# Patient Record
Sex: Female | Born: 1975
Health system: Southern US, Community
[De-identification: ages and names within clinical notes are randomized; demographics above are authoritative.]

## PROBLEM LIST (undated history)

## (undated) ENCOUNTER — Inpatient Hospital Stay (HOSPITAL_COMMUNITY): Payer: Self-pay

## (undated) DIAGNOSIS — R51 Headache: Secondary | ICD-10-CM

## (undated) DIAGNOSIS — Q858 Other phakomatoses, not elsewhere classified: Secondary | ICD-10-CM

## (undated) DIAGNOSIS — G43829 Menstrual migraine, not intractable, without status migrainosus: Secondary | ICD-10-CM

## (undated) DIAGNOSIS — B999 Unspecified infectious disease: Secondary | ICD-10-CM

## (undated) DIAGNOSIS — R569 Unspecified convulsions: Secondary | ICD-10-CM

## (undated) DIAGNOSIS — D649 Anemia, unspecified: Secondary | ICD-10-CM

## (undated) DIAGNOSIS — Q8589 Glaucoma in diseases classified elsewhere: Secondary | ICD-10-CM

## (undated) DIAGNOSIS — H42 Glaucoma in diseases classified elsewhere: Secondary | ICD-10-CM

## (undated) HISTORY — DX: Glaucoma in diseases classified elsewhere: H42

## (undated) HISTORY — DX: Menstrual migraine, not intractable, without status migrainosus: G43.829

## (undated) HISTORY — PX: TOE SURGERY: SHX1073

## (undated) HISTORY — PX: EYE SURGERY: SHX253

## (undated) HISTORY — DX: Other phakomatoses, not elsewhere classified: Q85.8

## (undated) HISTORY — DX: Glaucoma in diseases classified elsewhere: Q85.89

---
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
## 2008-11-30 HISTORY — PX: EYE SURGERY: SHX253

## 2013-10-01 ENCOUNTER — Other Ambulatory Visit: Payer: Self-pay | Admitting: Obstetrics and Gynecology

## 2013-10-01 LAB — HCG, QUANTITATIVE, PREGNANCY: Beta Hcg, Quant.: 31 m[IU]/mL — ABNORMAL HIGH

## 2014-01-02 ENCOUNTER — Ambulatory Visit (INDEPENDENT_AMBULATORY_CARE_PROVIDER_SITE_OTHER): Payer: BC Managed Care – PPO | Admitting: *Deleted

## 2014-01-02 ENCOUNTER — Encounter: Payer: Self-pay | Admitting: *Deleted

## 2014-01-02 DIAGNOSIS — N926 Irregular menstruation, unspecified: Secondary | ICD-10-CM

## 2014-01-02 DIAGNOSIS — Z3201 Encounter for pregnancy test, result positive: Secondary | ICD-10-CM

## 2014-01-02 LAB — POCT PREGNANCY, URINE: Preg Test, Ur: POSITIVE — AB

## 2014-01-02 LAB — OB RESULTS CONSOLE HGB/HCT, BLOOD
HCT: 40 %
Hemoglobin: 13.5 g/dL

## 2014-01-02 LAB — OB RESULTS CONSOLE PLATELET COUNT: Platelets: 209 10*3/uL

## 2014-01-02 LAB — HIV ANTIBODY (ROUTINE TESTING W REFLEX): HIV: NONREACTIVE

## 2014-01-02 NOTE — Progress Notes (Signed)
POSITIVE: pregnancy test, labs drawn, letter of verification.

## 2014-01-03 ENCOUNTER — Encounter: Payer: Self-pay | Admitting: Obstetrics and Gynecology

## 2014-01-03 DIAGNOSIS — O26899 Other specified pregnancy related conditions, unspecified trimester: Secondary | ICD-10-CM | POA: Insufficient documentation

## 2014-01-03 DIAGNOSIS — Z6791 Unspecified blood type, Rh negative: Secondary | ICD-10-CM | POA: Insufficient documentation

## 2014-01-03 LAB — OBSTETRIC PANEL
Antibody Screen: NEGATIVE
Basophils Absolute: 0 10*3/uL (ref 0.0–0.1)
Basophils Relative: 0 % (ref 0–1)
Eosinophils Absolute: 0.1 10*3/uL (ref 0.0–0.7)
Eosinophils Relative: 2 % (ref 0–5)
HCT: 39.6 % (ref 36.0–46.0)
Hemoglobin: 13.5 g/dL (ref 12.0–15.0)
Hepatitis B Surface Ag: NEGATIVE
Lymphocytes Relative: 25 % (ref 12–46)
Lymphs Abs: 1.4 10*3/uL (ref 0.7–4.0)
MCH: 30.3 pg (ref 26.0–34.0)
MCHC: 34.1 g/dL (ref 30.0–36.0)
MCV: 89 fL (ref 78.0–100.0)
Monocytes Absolute: 0.6 10*3/uL (ref 0.1–1.0)
Monocytes Relative: 10 % (ref 3–12)
Neutro Abs: 3.4 10*3/uL (ref 1.7–7.7)
Neutrophils Relative %: 63 % (ref 43–77)
Platelets: 209 10*3/uL (ref 150–400)
RBC: 4.45 MIL/uL (ref 3.87–5.11)
RDW: 13.8 % (ref 11.5–15.5)
Rh Type: NEGATIVE
Rubella: 1.4 Index — ABNORMAL HIGH (ref <0.90)
WBC: 5.5 10*3/uL (ref 4.0–10.5)

## 2014-01-04 LAB — CYSTIC FIBROSIS DIAGNOSTIC STUDY

## 2014-02-07 ENCOUNTER — Encounter: Payer: Self-pay | Admitting: Advanced Practice Midwife

## 2014-02-07 ENCOUNTER — Ambulatory Visit (INDEPENDENT_AMBULATORY_CARE_PROVIDER_SITE_OTHER): Payer: BC Managed Care – PPO | Admitting: Advanced Practice Midwife

## 2014-02-07 VITALS — BP 123/72 | Temp 97.2°F | Ht 62.0 in | Wt 240.3 lb

## 2014-02-07 DIAGNOSIS — Z3490 Encounter for supervision of normal pregnancy, unspecified, unspecified trimester: Secondary | ICD-10-CM

## 2014-02-07 DIAGNOSIS — Q858 Other phakomatoses, not elsewhere classified: Secondary | ICD-10-CM | POA: Insufficient documentation

## 2014-02-07 DIAGNOSIS — Q859 Phakomatosis, unspecified: Secondary | ICD-10-CM

## 2014-02-07 DIAGNOSIS — Q8589 Other phakomatoses, not elsewhere classified: Secondary | ICD-10-CM | POA: Insufficient documentation

## 2014-02-07 DIAGNOSIS — IMO0002 Reserved for concepts with insufficient information to code with codable children: Secondary | ICD-10-CM

## 2014-02-07 DIAGNOSIS — O219 Vomiting of pregnancy, unspecified: Secondary | ICD-10-CM

## 2014-02-07 DIAGNOSIS — Z3480 Encounter for supervision of other normal pregnancy, unspecified trimester: Secondary | ICD-10-CM

## 2014-02-07 DIAGNOSIS — O3680X0 Pregnancy with inconclusive fetal viability, not applicable or unspecified: Secondary | ICD-10-CM

## 2014-02-07 DIAGNOSIS — O09529 Supervision of elderly multigravida, unspecified trimester: Secondary | ICD-10-CM

## 2014-02-07 DIAGNOSIS — O210 Mild hyperemesis gravidarum: Secondary | ICD-10-CM

## 2014-02-07 LAB — POCT URINALYSIS DIP (DEVICE)
Bilirubin Urine: NEGATIVE
Glucose, UA: NEGATIVE mg/dL
Hgb urine dipstick: NEGATIVE
Ketones, ur: NEGATIVE mg/dL
Leukocytes, UA: NEGATIVE
Nitrite: NEGATIVE
Protein, ur: NEGATIVE mg/dL
Specific Gravity, Urine: 1.02 (ref 1.005–1.030)
Urobilinogen, UA: 0.2 mg/dL (ref 0.0–1.0)
pH: 7 (ref 5.0–8.0)

## 2014-02-07 LAB — US OB COMP LESS 14 WKS

## 2014-02-07 MED ORDER — DOXYLAMINE-PYRIDOXINE 10-10 MG PO TBEC: DELAYED_RELEASE_TABLET | ORAL | Status: DC

## 2014-02-07 NOTE — Progress Notes (Signed)
Subjective:    Misty Little is a G2P0010 [redacted]w[redacted]d being seen today for her first obstetrical visit.  Her obstetrical history is significant for SAB x1. Patient does intend to breast feed. Pregnancy history fully reviewed. 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  She is concerned about genetic risks for the baby related to her diagnosis. She also reports daily nausea without vomiting.  She has Diclegis samples but has not taken any and wants to discuss safety of medication.   Patient reports nausea.  Filed Vitals:   02/07/14 0941 02/07/14 0945  BP: 123/72   Temp: 97.2 F (36.2 C)   Height:  5\' 2"  (1.575 m)  Weight: 108.999 kg (240 lb 4.8 oz)     HISTORY: OB History  Gravida Para Term Preterm AB SAB TAB Ectopic Multiple Living  2 0 0 0 1 1 0 0 0 0     # Outcome Date GA Lbr Len/2nd Weight Sex Delivery Anes PTL Lv  2 CUR           1 SAB 08/2013 [redacted]w[redacted]d            Comments: no complications     Past Medical History  Diagnosis Date  . Sturge-Weber syndrome with glaucoma    Past Surgical History  Procedure Laterality Date  . Eye surgery  1977  . Toe surgery  1977   Family History  Problem Relation Age of Onset  . Heart disease Mother      Exam    Uterus:   ~10 week size  Pelvic Exam:    Perineum: No Hemorrhoids, Normal Perineum   Vulva: normal   Vagina:  normal mucosa, normal discharge   pH:    Cervix: no bleeding following Pap, no cervical motion tenderness, no lesions and nulliparous appearance   Adnexa: normal adnexa and no mass, fullness, tenderness   Bony Pelvis: average  System: Breast:  normal appearance, no masses or tenderness   Skin:  large hemangioma/birth mark covering right side of pt face with facial distortion/drooping of nose/mouth on right    Neurologic: oriented, normal, gait normal; reflexes normal and symmetric   Extremities:  normal strength, tone, and muscle mass, ROM of all joints is normal   HEENT neck supple with midline trachea and thyroid without masses   Mouth/Teeth mucous membranes moist, pharynx normal without lesions   Neck supple and no masses   Cardiovascular: regular rate and rhythm   Respiratory:  appears well, vitals normal, no respiratory distress, acyanotic, normal RR, ear and throat exam is normal, neck free of mass or lymphadenopathy, chest clear, no wheezing, crepitations, rhonchi, normal symmetric air entry   Abdomen: soft, non-tender; bowel sounds normal; no masses,  no organomegaly   Urinary: urethral meatus normal      Assessment:    Pregnancy: G2P0010 Patient Active Problem List   Diagnosis Date Noted  . Advanced maternal age, antepartum 02/07/2014  . 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 Rh negative state in antepartum period 01/03/2014        Plan:     Initial labs drawn. Prenatal vitamins. Problem list reviewed and updated. Genetic Screening discussed First Screen and Panorama: Panorama (Cell-free DNA) test requested.  MFM consult scheduled. MFM recommended NT also--scheduled.   Ultrasound discussed; fetal survey: requested.  Follow up in 4 weeks. 50% of 30 min visit spent on counseling and coordination of  care.     LEFTWICH-KIRBY, LISA 02/07/2014

## 2014-02-07 NOTE — Progress Notes (Signed)
Bedside US for FHR = 148 bpm per PW doppler.  FM observed.  Sharen CounterLisa Leftwich-Kirby CNM notified.

## 2014-02-07 NOTE — Progress Notes (Signed)
P= 74, Here for initial visit. Given new patient information. Discussed bmi/ appropriate weight gain.

## 2014-02-08 LAB — PRESCRIPTION MONITORING PROFILE (19 PANEL)
Amphetamine/Meth: NEGATIVE ng/mL
Barbiturate Screen, Urine: NEGATIVE ng/mL
Benzodiazepine Screen, Urine: NEGATIVE ng/mL
Buprenorphine, Urine: NEGATIVE ng/mL
Cannabinoid Scrn, Ur: NEGATIVE ng/mL
Carisoprodol, Urine: NEGATIVE ng/mL
Cocaine Metabolites: NEGATIVE ng/mL
Creatinine, Urine: 70.59 mg/dL (ref >20.0)
Fentanyl, Ur: NEGATIVE ng/mL
MDMA URINE: NEGATIVE ng/mL
Meperidine, Ur: NEGATIVE ng/mL
Methadone Screen, Urine: NEGATIVE ng/mL
Methaqualone: NEGATIVE ng/mL
Nitrites, Initial: NEGATIVE ug/mL
Opiate Screen, Urine: NEGATIVE ng/mL
Oxycodone Screen, Ur: NEGATIVE ng/mL
Phencyclidine, Ur: NEGATIVE ng/mL
Propoxyphene: NEGATIVE ng/mL
Tapentadol, urine: NEGATIVE ng/mL
Tramadol Scrn, Ur: NEGATIVE ng/mL
Zolpidem, Urine: NEGATIVE ng/mL
pH, Initial: 7.4 pH (ref 4.5–8.9)

## 2014-02-08 LAB — GLUCOSE TOLERANCE, 1 HOUR (50G) W/O FASTING: GLUCOSE 1 HOUR GTT: 83 mg/dL (ref 70–140)

## 2014-02-11 LAB — CULTURE, OB URINE: Colony Count: >=100000

## 2014-02-15 ENCOUNTER — Other Ambulatory Visit: Payer: Self-pay | Admitting: Advanced Practice Midwife

## 2014-02-15 DIAGNOSIS — Z3682 Encounter for antenatal screening for nuchal translucency: Secondary | ICD-10-CM

## 2014-02-20 ENCOUNTER — Ambulatory Visit (HOSPITAL_COMMUNITY)
Admission: RE | Admit: 2014-02-20 | Discharge: 2014-02-20 | Disposition: A | Discharge status: Home / Self Care | Payer: BC Managed Care – PPO | Source: Ambulatory Visit | Attending: Family Medicine | Admitting: Family Medicine

## 2014-02-20 ENCOUNTER — Ambulatory Visit (HOSPITAL_COMMUNITY): Payer: BC Managed Care – PPO

## 2014-02-20 DIAGNOSIS — O3510X0 Maternal care for (suspected) chromosomal abnormality in fetus, unspecified, not applicable or unspecified: Secondary | ICD-10-CM | POA: Insufficient documentation

## 2014-02-20 DIAGNOSIS — Z3682 Encounter for antenatal screening for nuchal translucency: Secondary | ICD-10-CM

## 2014-02-20 DIAGNOSIS — Z3689 Encounter for other specified antenatal screening: Secondary | ICD-10-CM | POA: Insufficient documentation

## 2014-02-20 DIAGNOSIS — O351XX0 Maternal care for (suspected) chromosomal abnormality in fetus, not applicable or unspecified: Secondary | ICD-10-CM | POA: Insufficient documentation

## 2014-02-20 DIAGNOSIS — O09529 Supervision of elderly multigravida, unspecified trimester: Secondary | ICD-10-CM | POA: Insufficient documentation

## 2014-02-20 DIAGNOSIS — O352XX0 Maternal care for (suspected) hereditary disease in fetus, not applicable or unspecified: Secondary | ICD-10-CM | POA: Insufficient documentation

## 2014-02-20 NOTE — ED Notes (Signed)
Appointment Date: 02/20/2014 DOB: Jan 02, 1976 Referring Provider: Vale Haven, MD Attending: Dr. Particia Nearing  [name]  was seen for genetic counseling because of a maternal age of 38 y.o..     She was counseled regarding maternal age and the association with risk for chromosome conditions due to nondisjunction with aging of the ova.   We reviewed chromosomes, nondisjunction, and the associated 1 in 101 risk for fetal aneuploidy related to a maternal age of 38 y.o. at [redacted]w[redacted]d gestation.  She was counseled that the risk for aneuploidy decreases as gestational age increases, accounting for those pregnancies which spontaneously abort.  We specifically discussed Down syndrome (trisomy 36), trisomies 8 and 32, and sex chromosome aneuploidies (47,XXX and 47,XXY) including the common features and prognoses of each.   We reviewed available screening options including First Screen, noninvasive prenatal screening (NIPS)/cell free fetal DNA (cffDNA) testing, and detailed ultrasound.  She was counseled that screening tests are used to modify a patient's a priori risk for aneuploidy, typically based on age. This estimate provides a pregnancy specific risk assessment. We reviewed the benefits and limitations of each option. Specifically, we discussed the conditions for which each test screens, the detection rates, and false positive rates of each. She was also counseled regarding diagnostic testing via [facility] and amniocentesis. We reviewed the approximate 1 in 300-500 risk for complications from amniocentesis, including spontaneous pregnancy loss. After consideration of all the options, she elected to proceed with NIPS.  Those results will be available in 8-10 days. Misty Little was counseld regarding the difference in cost between first screen and NIPS, she understands that approximately $3000 is billed to her insurance for NIPS and that the exact amount that she would be required to pay would be based upon the specifics  of her insurance plan and be subject to her co-pay and deductible.    She also expressed interest in pursuing a nuchal translucency ultrasound, which was performed today.  The report will be documented separately.  She understands that screening tests cannot rule out all birth defects or genetic syndromes. The patient was advised of this limitation and states she still does not want diagnostic testing at this time.   Misty Little was provided with written information regarding cystic fibrosis (CF) including the carrier frequency and incidence in the Caucasian population, the availability of carrier testing and prenatal diagnosis if indicated.  In addition, we discussed that CF is routinely screened for as part of the [place] newborn screening panel.  She declined testing today.   Both family histories were reviewed.  Misty Little reported that she has Sturge Weber syndrome.  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  We discussed that Sturge Weber syndrome (SWS) is caused by somatic mosaicism in the GNAQ gene on chromsome 9.  The most common features are facial cutaneous vascular malformations (port-wine stains), seizures and glaucoma.  The clinical course can be variable with some children having intractable seizures, mental retardation and recurrent stroke-like episodes.  No clear evidence of heredity has been discovered with SWS.  She was counseled that somatic mosaicism means that there is a change in the GNAQ gene in some of the cells, but not in others.  In addition, a somatic change means that the mutation was not present in the initial egg or sperm cell which formed the embryo from which she developed.  Thus, it may be that some of her egg  cells contain this mutation, or it may be that none of them contain the mutation.  If none of Misty Little's egg cells contain the mutation, then we would not expect her to have a child with SWS.  If some of her egg cells contain  the mutation, then there would be an increased chance to have a child with SWS, but the specific chance would depend upon the proportion of egg cells with the mutation.  Misty Little understands that at this time, there is no prenatal testing for SWS.  Misty Little was comfortable with the overall low chance, and stated that it would not bother her if her child had SWS.  The remainder of the family history was found to be noncontributory for birth defects, intellectual disability, and known genetic conditions. Without further information regarding the provided family history, an accurate genetic risk cannot be calculated. Further genetic counseling is warranted if more information is obtained.  Misty Little denied exposure to environmental toxins or chemical agents. She denied the use of alcohol, tobacco or street drugs. She denied significant viral illnesses during the course of her pregnancy. Her medical and surgical histories were noncontributory, other than as described above.   I counseled Misty Little regarding the above risks and available options.  The approximate face-to-face time with the genetic counselor was 50 minutes. Most of the counseling was provided by Patrina Leveringaylor Zuck, [employer] genetic counseling student, under my direct supervision.   Misty Gemmaaragh Conrad, MS  Certified Genetic Counselor

## 2014-02-21 ENCOUNTER — Other Ambulatory Visit (HOSPITAL_COMMUNITY): Payer: BC Managed Care – PPO

## 2014-02-21 ENCOUNTER — Encounter: Payer: Self-pay | Admitting: Advanced Practice Midwife

## 2014-02-21 ENCOUNTER — Other Ambulatory Visit: Payer: Self-pay | Admitting: Advanced Practice Midwife

## 2014-02-21 MED ORDER — NITROFURANTOIN MONOHYD MACRO 100 MG PO CAPS: 100.0000 mg | ORAL_CAPSULE | Freq: Two times a day (BID) | ORAL | Status: DC

## 2014-02-22 ENCOUNTER — Telehealth: Payer: Self-pay

## 2014-02-22 NOTE — Telephone Encounter (Signed)
Message copied by Louanna RawAMPBELL, Dalonte Hardage M on Thu Feb 22, 2014  9:24 AM ------      Message from: LEFTWICH-KIRBY, LISA A      Created: Wed Feb 21, 2014  2:21 PM       Please call pt to inform her of UTI in urine at initial prenatal visit on 02/07/14.  Macrobid 100 mg BID x7 days sent to pharmacy.  Thank you. ------

## 2014-02-22 NOTE — Telephone Encounter (Signed)
Called pt. And informed her of UTI and of prescription waiting for her at her pharmacy. Informed her she will take the Macrobid twice a day for 7 days. Pt. Verbalized understanding and had no further questions or concerns.

## 2014-03-02 ENCOUNTER — Telehealth (HOSPITAL_COMMUNITY): Payer: Self-pay

## 2014-03-02 NOTE — Telephone Encounter (Signed)
Called Misty Little to discuss her cell free fetal DNA test results.  [name] had Panorama testing through [facility].  Testing was offered because of a maternal age of 38.   The patient was identified by name and DOB.  We reviewed that these are within normal limits, showing a less than 1 in 10,000 risk for trisomies 21, 18 and 13, and monosomy X (Turner syndrome).  In addition, the risk for triploidy/vanishing twin and sex chromosome trisomies (47,XXX and 47,XXY) was also low risk.  We reviewed that this testing identifies > 99% of pregnancies with trisomy 5921, trisomy 8513, sex chromosome trisomies (47,XXX and 47,XXY), and triploidy. The detection rate for trisomy 18 is 96%.  The detection rate for monosomy X is ~92%.  The false positive rate is <0.1% for all conditions. Testing was also consistent with female gender.  The patient did wish to know gender.  She understands that this testing does not identify all genetic conditions.  All questions were answered to her satisfaction, she was encouraged to call with additional questions or concerns.  Despina AriasSTANLEY, Misty Schaffert, MS Certified Genetic Counselor

## 2014-03-07 ENCOUNTER — Ambulatory Visit (INDEPENDENT_AMBULATORY_CARE_PROVIDER_SITE_OTHER): Payer: BC Managed Care – PPO | Admitting: Family Medicine

## 2014-03-07 VITALS — BP 111/74 | Temp 97.2°F | Wt 246.5 lb

## 2014-03-07 DIAGNOSIS — O26899 Other specified pregnancy related conditions, unspecified trimester: Secondary | ICD-10-CM

## 2014-03-07 DIAGNOSIS — Q859 Phakomatosis, unspecified: Secondary | ICD-10-CM

## 2014-03-07 DIAGNOSIS — O360990 Maternal care for other rhesus isoimmunization, unspecified trimester, not applicable or unspecified: Secondary | ICD-10-CM

## 2014-03-07 DIAGNOSIS — Q8589 Other phakomatoses, not elsewhere classified: Secondary | ICD-10-CM

## 2014-03-07 DIAGNOSIS — O09529 Supervision of elderly multigravida, unspecified trimester: Secondary | ICD-10-CM

## 2014-03-07 DIAGNOSIS — IMO0002 Reserved for concepts with insufficient information to code with codable children: Secondary | ICD-10-CM

## 2014-03-07 DIAGNOSIS — Z6791 Unspecified blood type, Rh negative: Secondary | ICD-10-CM

## 2014-03-07 DIAGNOSIS — Q858 Other phakomatoses, not elsewhere classified: Secondary | ICD-10-CM

## 2014-03-07 LAB — POCT URINALYSIS DIP (DEVICE)
Bilirubin Urine: NEGATIVE
Glucose, UA: NEGATIVE mg/dL
Hgb urine dipstick: NEGATIVE
Leukocytes, UA: NEGATIVE
Nitrite: NEGATIVE
PH: 5.5 (ref 5.0–8.0)
Protein, ur: NEGATIVE mg/dL
Specific Gravity, Urine: >=1.03 (ref 1.005–1.030)
Urobilinogen, UA: 0.2 mg/dL (ref 0.0–1.0)

## 2014-03-07 NOTE — Progress Notes (Signed)
S: 38 you G2P0010 @ 7936w6d for ROBV - doing well - no concerns  O: see flowsheet  A/P - bp elevated initiallly measured on forearm but on repeat normal - anatomy US scheduled - panorama reviewed and negative.  - f/u in 4 weeks

## 2014-03-07 NOTE — Progress Notes (Signed)
Pulse- 79 Patient reports some pains in pelvis/lower abdomen, kind of like stretching

## 2014-03-07 NOTE — Patient Instructions (Signed)
Pregnancy - First Trimester  During sexual intercourse, millions of sperm go into the vagina. Only 1 sperm will penetrate and fertilize the female egg while it is in the Fallopian tube. One week later, the fertilized egg implants into the wall of the uterus. An embryo begins to develop into a baby. At 6 to 8 weeks, the eyes and face are formed and the heartbeat can be seen on ultrasound. At the end of 12 weeks (first trimester), all the baby's organs are formed. Now that you are pregnant, you will want to do everything you can to have a healthy baby. Two of the most important things are to get good prenatal care and follow your caregiver's instructions. Prenatal care is all the medical care you receive before the baby's birth. It is given to prevent, find, and treat problems during the pregnancy and childbirth.  PRENATAL EXAMS  · During prenatal visits, your weight, blood pressure, and urine are checked. This is done to make sure you are healthy and progressing normally during the pregnancy.  · A pregnant woman should gain 25 to 35 pounds during the pregnancy. However, if you are overweight or underweight, your caregiver will advise you regarding your weight.  · Your caregiver will ask and answer questions for you.  · Blood work, cervical cultures, other necessary tests, and a Pap test are done during your prenatal exams. These tests are done to check on your health and the probable health of your baby. Tests are strongly recommended and done for HIV with your permission. This is the virus that causes AIDS. These tests are done because medicines can be given to help prevent your baby from being born with this infection should you have been infected without knowing it. Blood work is also used to find out your blood type, previous infections, and follow your blood levels (hemoglobin).  · Low hemoglobin (anemia) is common during pregnancy. Iron and vitamins are given to help prevent this. Later in the pregnancy, blood  tests for diabetes will be done along with any other tests if any problems develop.  · You may need other tests to make sure you and the baby are doing well.  CHANGES DURING THE FIRST TRIMESTER   Your body goes through many changes during pregnancy. They vary from person to person. Talk to your caregiver about changes you notice and are concerned about. Changes can include:  · Your menstrual period stops.  · The egg and sperm carry the genes that determine what you look like. Genes from you and your partner are forming a baby. The female genes determine whether the baby is a boy or a girl.  · Your body increases in girth and you may feel bloated.  · Feeling sick to your stomach (nauseous) and throwing up (vomiting). If the vomiting is uncontrollable, call your caregiver.  · Your breasts will begin to enlarge and become tender.  · Your nipples may stick out more and become darker.  · The need to urinate more. Painful urination may mean you have a bladder infection.  · Tiring easily.  · Loss of appetite.  · Cravings for certain kinds of food.  · At first, you may gain or lose a couple of pounds.  · You may have changes in your emotions from day to day (excited to be pregnant or concerned something may go wrong with the pregnancy and baby).  · You may have more vivid and strange dreams.  HOME CARE INSTRUCTIONS   ·   It is very important to avoid all smoking, alcohol and non-prescribed drugs during your pregnancy. These affect the formation and growth of the baby. Avoid chemicals while pregnant to ensure the delivery of a healthy infant.  · Start your prenatal visits by the 12th week of pregnancy. They are usually scheduled monthly at first, then more often in the last 2 months before delivery. Keep your caregiver's appointments. Follow your caregiver's instructions regarding medicine use, blood and lab tests, exercise, and diet.  · During pregnancy, you are providing food for you and your baby. Eat regular, well-balanced  meals. Choose foods such as meat, fish, milk and other low fat dairy products, vegetables, fruits, and whole-grain breads and cereals. Your caregiver will tell you of the ideal weight gain.  · You can help morning sickness by keeping soda crackers at the bedside. Eat a couple before arising in the morning. You may want to use the crackers without salt on them.  · Eating 4 to 5 small meals rather than 3 large meals a day also may help the nausea and vomiting.  · Drinking liquids between meals instead of during meals also seems to help nausea and vomiting.  · A physical sexual relationship may be continued throughout pregnancy if there are no other problems. Problems may be early (premature) leaking of amniotic fluid from the membranes, vaginal bleeding, or belly (abdominal) pain.  · Exercise regularly if there are no restrictions. Check with your caregiver or physical therapist if you are unsure of the safety of some of your exercises. Greater weight gain will occur in the last 2 trimesters of pregnancy. Exercising will help:  · Control your weight.  · Keep you in shape.  · Prepare you for labor and delivery.  · Help you lose your pregnancy weight after you deliver your baby.  · Wear a good support or jogging bra for breast tenderness during pregnancy. This may help if worn during sleep too.  · Ask when prenatal classes are available. Begin classes when they are offered.  · Do not use hot tubs, steam rooms, or saunas.  · Wear your seat belt when driving. This protects you and your baby if you are in an accident.  · Avoid raw meat, uncooked cheese, cat litter boxes, and soil used by cats throughout the pregnancy. These carry germs that can cause birth defects in the baby.  · The first trimester is a good time to visit your dentist for your dental health. Getting your teeth cleaned is okay. Use a softer toothbrush and brush gently during pregnancy.  · Ask for help if you have financial, counseling, or nutritional needs  during pregnancy. Your caregiver will be able to offer counseling for these needs as well as refer you for other special needs.  · Do not take any medicines or herbs unless told by your caregiver.  · Inform your caregiver if there is any mental or physical domestic violence.  · Make a list of emergency phone numbers of family, friends, hospital, and police and fire departments.  · Write down your questions. Take them to your prenatal visit.  · Do not douche.  · Do not cross your legs.  · If you have to stand for long periods of time, rotate you feet or take small steps in a circle.  · You may have more vaginal secretions that may require a sanitary pad. Do not use tampons or scented sanitary pads.  MEDICINES AND DRUG USE IN PREGNANCY  ·   Take prenatal vitamins as directed. The vitamin should contain 1 milligram of folic acid. Keep all vitamins out of reach of children. Only a couple vitamins or tablets containing iron may be fatal to a baby or young child when ingested.  · Avoid use of all medicines, including herbs, over-the-counter medicines, not prescribed or suggested by your caregiver. Only take over-the-counter or prescription medicines for pain, discomfort, or fever as directed by your caregiver. Do not use aspirin, ibuprofen, or naproxen unless directed by your caregiver.  · Let your caregiver also know about herbs you may be using.  · Alcohol is related to a number of birth defects. This includes fetal alcohol syndrome. All alcohol, in any form, should be avoided completely. Smoking will cause low birth rate and premature babies.  · Street or illegal drugs are very harmful to the baby. They are absolutely forbidden. A baby born to an addicted mother will be addicted at birth. The baby will go through the same withdrawal an adult does.  · Let your caregiver know about any medicines that you have to take and for what reason you take them.  SEEK MEDICAL CARE IF:   You have any concerns or worries during your  pregnancy. It is better to call with your questions if you feel they cannot wait, rather than worry about them.  SEEK IMMEDIATE MEDICAL CARE IF:   · An unexplained oral temperature above 102° F (38.9° C) develops, or as your caregiver suggests.  · You have leaking of fluid from the vagina (birth canal). If leaking membranes are suspected, take your temperature and inform your caregiver of this when you call.  · There is vaginal spotting or bleeding. Notify your caregiver of the amount and how many pads are used.  · You develop a bad smelling vaginal discharge with a change in the color.  · You continue to feel sick to your stomach (nauseated) and have no relief from remedies suggested. You vomit blood or coffee ground-like materials.  · You lose more than 2 pounds of weight in 1 week.  · You gain more than 2 pounds of weight in 1 week and you notice swelling of your face, hands, feet, or legs.  · You gain 5 pounds or more in 1 week (even if you do not have swelling of your hands, face, legs, or feet).  · You get exposed to German measles and have never had them.  · You are exposed to fifth disease or chickenpox.  · You develop belly (abdominal) pain. Round ligament discomfort is a common non-cancerous (benign) cause of abdominal pain in pregnancy. Your caregiver still must evaluate this.  · You develop headache, fever, diarrhea, pain with urination, or shortness of breath.  · You fall or are in a car accident or have any kind of trauma.  · There is mental or physical violence in your home.  Document Released: 11/10/2001 Document Revised: 08/10/2012 Document Reviewed: 05/14/2009  [employer]® Patient Information ©2014 [employer].

## 2014-03-08 ENCOUNTER — Other Ambulatory Visit (HOSPITAL_COMMUNITY): Payer: Self-pay | Admitting: Maternal and Fetal Medicine

## 2014-03-08 DIAGNOSIS — O09529 Supervision of elderly multigravida, unspecified trimester: Secondary | ICD-10-CM

## 2014-03-08 DIAGNOSIS — O352XX0 Maternal care for (suspected) hereditary disease in fetus, not applicable or unspecified: Secondary | ICD-10-CM

## 2014-03-08 DIAGNOSIS — Z0489 Encounter for examination and observation for other specified reasons: Secondary | ICD-10-CM

## 2014-03-08 DIAGNOSIS — IMO0002 Reserved for concepts with insufficient information to code with codable children: Secondary | ICD-10-CM

## 2014-03-16 ENCOUNTER — Other Ambulatory Visit: Payer: Self-pay

## 2014-04-03 ENCOUNTER — Ambulatory Visit (HOSPITAL_COMMUNITY)
Admission: RE | Admit: 2014-04-03 | Discharge: 2014-04-03 | Disposition: A | Discharge status: Home / Self Care | Payer: BC Managed Care – PPO | Source: Ambulatory Visit | Attending: Family Medicine | Admitting: Family Medicine

## 2014-04-03 ENCOUNTER — Ambulatory Visit (INDEPENDENT_AMBULATORY_CARE_PROVIDER_SITE_OTHER): Payer: BC Managed Care – PPO | Admitting: Obstetrics and Gynecology

## 2014-04-03 ENCOUNTER — Encounter: Payer: Self-pay | Admitting: Obstetrics and Gynecology

## 2014-04-03 VITALS — BP 106/48 | HR 88 | Temp 98.7°F | Wt 253.6 lb

## 2014-04-03 DIAGNOSIS — IMO0002 Reserved for concepts with insufficient information to code with codable children: Secondary | ICD-10-CM

## 2014-04-03 DIAGNOSIS — O09529 Supervision of elderly multigravida, unspecified trimester: Secondary | ICD-10-CM

## 2014-04-03 DIAGNOSIS — Z1389 Encounter for screening for other disorder: Secondary | ICD-10-CM | POA: Insufficient documentation

## 2014-04-03 DIAGNOSIS — O352XX0 Maternal care for (suspected) hereditary disease in fetus, not applicable or unspecified: Secondary | ICD-10-CM | POA: Insufficient documentation

## 2014-04-03 DIAGNOSIS — E669 Obesity, unspecified: Secondary | ICD-10-CM | POA: Insufficient documentation

## 2014-04-03 DIAGNOSIS — R21 Rash and other nonspecific skin eruption: Secondary | ICD-10-CM

## 2014-04-03 DIAGNOSIS — Z363 Encounter for antenatal screening for malformations: Secondary | ICD-10-CM | POA: Insufficient documentation

## 2014-04-03 DIAGNOSIS — Z0489 Encounter for examination and observation for other specified reasons: Secondary | ICD-10-CM

## 2014-04-03 DIAGNOSIS — E66813 Obesity, class 3: Secondary | ICD-10-CM | POA: Insufficient documentation

## 2014-04-03 LAB — POCT URINALYSIS DIP (DEVICE)
Bilirubin Urine: NEGATIVE
Glucose, UA: NEGATIVE mg/dL
Hgb urine dipstick: NEGATIVE
Ketones, ur: NEGATIVE mg/dL
Leukocytes, UA: NEGATIVE
Nitrite: NEGATIVE
Protein, ur: NEGATIVE mg/dL
Specific Gravity, Urine: 1.02 (ref 1.005–1.030)
Urobilinogen, UA: 0.2 mg/dL (ref 0.0–1.0)
pH: 7 (ref 5.0–8.0)

## 2014-04-03 MED ORDER — PRENATAL PLUS 27-1 MG PO TABS: 1.0000 | ORAL_TABLET | Freq: Every day | ORAL | Status: DC

## 2014-04-03 MED ORDER — HYDROCORTISONE 1 % EX CREA
TOPICAL_CREAM | CUTANEOUS | Status: DC
Start: 2014-04-03 — End: 2014-05-04

## 2014-04-03 NOTE — Progress Notes (Signed)
Nl NT and panorama. Has anatomy scan scheduled. Just had US with MFM, report pending. Has fine papular pruritic rash upper abd. ?PUPPS  Rx hydrocortisone cream. Fundus at [id number].

## 2014-04-03 NOTE — Patient Instructions (Signed)
Second Trimester of Pregnancy The second trimester is from week 13 through week 28, months 4 through 6. The second trimester is often a time when you feel your best. Your body has also adjusted to being pregnant, and you begin to feel better physically. Usually, morning sickness has lessened or quit completely, you may have more energy, and you may have an increase in appetite. The second trimester is also a time when the fetus is growing rapidly. At the end of the sixth month, the fetus is about 9 inches long and weighs about 1 pounds. You will likely begin to feel the baby move (quickening) between 18 and 20 weeks of the pregnancy. BODY CHANGES Your body goes through many changes during pregnancy. The changes vary from woman to woman.   Your weight will continue to increase. You will notice your lower abdomen bulging out.  You may begin to get stretch marks on your hips, abdomen, and breasts.  You may develop headaches that can be relieved by medicines approved by your caregiver.  You may urinate more often because the fetus is pressing on your bladder.  You may develop or continue to have heartburn as a result of your pregnancy.  You may develop constipation because certain hormones are causing the muscles that push waste through your intestines to slow down.  You may develop hemorrhoids or swollen, bulging veins (varicose veins).  You may have back pain because of the weight gain and pregnancy hormones relaxing your joints between the bones in your pelvis and as a result of a shift in weight and the muscles that support your balance.  Your breasts will continue to grow and be tender.  Your gums may bleed and may be sensitive to brushing and flossing.  Dark spots or blotches (chloasma, mask of pregnancy) may develop on your face. This will likely fade after the baby is born.  A dark line from your belly button to the pubic area (linea nigra) may appear. This will likely fade after the  baby is born. WHAT TO EXPECT AT YOUR PRENATAL VISITS During a routine prenatal visit:  You will be weighed to make sure you and the fetus are growing normally.  Your blood pressure will be taken.  Your abdomen will be measured to track your baby's growth.  The fetal heartbeat will be listened to.  Any test results from the previous visit will be discussed. Your caregiver may ask you:  How you are feeling.  If you are feeling the baby move.  If you have had any abnormal symptoms, such as leaking fluid, bleeding, severe headaches, or abdominal cramping.  If you have any questions. Other tests that may be performed during your second trimester include:  Blood tests that check for:  Low iron levels (anemia).  Gestational diabetes (between 24 and 28 weeks).  Rh antibodies.  Urine tests to check for infections, diabetes, or protein in the urine.  An ultrasound to confirm the proper growth and development of the baby.  An amniocentesis to check for possible genetic problems.  Fetal screens for spina bifida and Down syndrome. HOME CARE INSTRUCTIONS   Avoid all smoking, herbs, alcohol, and unprescribed drugs. These chemicals affect the formation and growth of the baby.  Follow your caregiver's instructions regarding medicine use. There are medicines that are either safe or unsafe to take during pregnancy.  Exercise only as directed by your caregiver. Experiencing uterine cramps is a good sign to stop exercising.  Continue to eat regular,   healthy meals.  Wear a good support bra for breast tenderness.  Do not use hot tubs, steam rooms, or saunas.  Wear your seat belt at all times when driving.  Avoid raw meat, uncooked cheese, cat litter boxes, and soil used by cats. These carry germs that can cause birth defects in the baby.  Take your prenatal vitamins.  Try taking a stool softener (if your caregiver approves) if you develop constipation. Eat more high-fiber foods,  such as fresh vegetables or fruit and whole grains. Drink plenty of fluids to keep your urine clear or pale yellow.  Take warm sitz baths to soothe any pain or discomfort caused by hemorrhoids. Use hemorrhoid cream if your caregiver approves.  If you develop varicose veins, wear support hose. Elevate your feet for 15 minutes, 3 4 times a day. Limit salt in your diet.  Avoid heavy lifting, wear low heel shoes, and practice good posture.  Rest with your legs elevated if you have leg cramps or low back pain.  Visit your dentist if you have not gone yet during your pregnancy. Use a soft toothbrush to brush your teeth and be gentle when you floss.  A sexual relationship may be continued unless your caregiver directs you otherwise.  Continue to go to all your prenatal visits as directed by your caregiver. SEEK MEDICAL CARE IF:   You have dizziness.  You have mild pelvic cramps, pelvic pressure, or nagging pain in the abdominal area.  You have persistent nausea, vomiting, or diarrhea.  You have a bad smelling vaginal discharge.  You have pain with urination. SEEK IMMEDIATE MEDICAL CARE IF:   You have a fever.  You are leaking fluid from your vagina.  You have spotting or bleeding from your vagina.  You have severe abdominal cramping or pain.  You have rapid weight gain or loss.  You have shortness of breath with chest pain.  You notice sudden or extreme swelling of your face, hands, ankles, feet, or legs.  You have not felt your baby move in over an hour.  You have severe headaches that do not go away with medicine.  You have vision changes. Document Released: 11/10/2001 Document Revised: 07/19/2013 Document Reviewed: 01/17/2013 [employer] Patient Information 2014 [employer].  

## 2014-04-03 NOTE — Progress Notes (Signed)
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

## 2014-04-09 ENCOUNTER — Other Ambulatory Visit: Payer: Self-pay | Admitting: Advanced Practice Midwife

## 2014-04-09 DIAGNOSIS — O09529 Supervision of elderly multigravida, unspecified trimester: Secondary | ICD-10-CM

## 2014-04-09 DIAGNOSIS — E669 Obesity, unspecified: Secondary | ICD-10-CM

## 2014-04-09 DIAGNOSIS — O352XX0 Maternal care for (suspected) hereditary disease in fetus, not applicable or unspecified: Secondary | ICD-10-CM

## 2014-04-09 DIAGNOSIS — O99210 Obesity complicating pregnancy, unspecified trimester: Secondary | ICD-10-CM

## 2014-04-11 ENCOUNTER — Encounter (HOSPITAL_COMMUNITY): Payer: Self-pay | Admitting: *Deleted

## 2014-04-11 ENCOUNTER — Telehealth: Payer: Self-pay | Admitting: *Deleted

## 2014-04-11 ENCOUNTER — Inpatient Hospital Stay (HOSPITAL_COMMUNITY)
Admission: AD | Admit: 2014-04-11 | Discharge: 2014-04-11 | Disposition: A | Discharge status: Home / Self Care | Payer: BC Managed Care – PPO | Source: Ambulatory Visit | Attending: Obstetrics & Gynecology | Admitting: Obstetrics & Gynecology

## 2014-04-11 DIAGNOSIS — H81319 Aural vertigo, unspecified ear: Secondary | ICD-10-CM

## 2014-04-11 DIAGNOSIS — O99891 Other specified diseases and conditions complicating pregnancy: Secondary | ICD-10-CM | POA: Insufficient documentation

## 2014-04-11 DIAGNOSIS — Z3490 Encounter for supervision of normal pregnancy, unspecified, unspecified trimester: Secondary | ICD-10-CM

## 2014-04-11 DIAGNOSIS — O9989 Other specified diseases and conditions complicating pregnancy, childbirth and the puerperium: Principal | ICD-10-CM

## 2014-04-11 DIAGNOSIS — R42 Dizziness and giddiness: Secondary | ICD-10-CM | POA: Insufficient documentation

## 2014-04-11 HISTORY — DX: Headache: R51

## 2014-04-11 HISTORY — DX: Unspecified convulsions: R56.9

## 2014-04-11 HISTORY — DX: Anemia, unspecified: D64.9

## 2014-04-11 HISTORY — DX: Unspecified infectious disease: B99.9

## 2014-04-11 LAB — COMPREHENSIVE METABOLIC PANEL
ALT: 20 U/L (ref 0–35)
AST: 31 U/L (ref 0–37)
Albumin: 2.8 g/dL — ABNORMAL LOW (ref 3.5–5.2)
Alkaline Phosphatase: 37 U/L — ABNORMAL LOW (ref 39–117)
BUN: 12 mg/dL (ref 6–23)
CALCIUM: 9.1 mg/dL (ref 8.4–10.5)
CO2: 24 meq/L (ref 19–32)
Chloride: 100 mEq/L (ref 96–112)
Creatinine, Ser: 0.61 mg/dL (ref 0.50–1.10)
GFR calc Af Amer: >90 mL/min (ref >90)
GLUCOSE: 87 mg/dL (ref 70–99)
POTASSIUM: 4 meq/L (ref 3.7–5.3)
SODIUM: 136 meq/L — AB (ref 137–147)
Total Bilirubin: <0.2 mg/dL — ABNORMAL LOW (ref 0.3–1.2)
Total Protein: 6.2 g/dL (ref 6.0–8.3)

## 2014-04-11 LAB — URINALYSIS, ROUTINE W REFLEX MICROSCOPIC
Bilirubin Urine: NEGATIVE
GLUCOSE, UA: NEGATIVE mg/dL
HGB URINE DIPSTICK: NEGATIVE
Ketones, ur: NEGATIVE mg/dL
Leukocytes, UA: NEGATIVE
Nitrite: NEGATIVE
PH: 6.5 (ref 5.0–8.0)
Protein, ur: NEGATIVE mg/dL
SPECIFIC GRAVITY, URINE: 1.025 (ref 1.005–1.030)
Urobilinogen, UA: 0.2 mg/dL (ref 0.0–1.0)

## 2014-04-11 MED ORDER — PROMETHAZINE HCL 25 MG PO TABS: 25.0000 mg | ORAL_TABLET | Freq: Four times a day (QID) | ORAL | Status: DC | PRN

## 2014-04-11 MED ORDER — PROMETHAZINE HCL 25 MG PO TABS
25.0000 mg | ORAL_TABLET | Freq: Once | ORAL | Status: AC
  Administered 2014-04-11: 25 mg via ORAL
  Filled 2014-04-11: qty 1

## 2014-04-11 NOTE — Telephone Encounter (Signed)
Patient reports that she is feeling very dizzy even when sitting. She states it has happened several times and she has to stop what she is doing. I advised that she go to [facility]U and be evaluated. Patient agrees and will have someone take her to MAU.

## 2014-04-11 NOTE — MAU Provider Note (Signed)
History     CSN: [id number]  Arrival date and time: 04/11/14 1413   First Provider Initiated Contact with Patient 04/11/14 1504      Chief Complaint  Patient presents with  . Dizziness   HPI Comments: Misty Little 38 y.o. [id number] comes to MAU with feeling dizzy today. She had just started her shift at work and had not felt stressed. She has been taking Diclegis for nausea and still feels nauseated . She feels well hydrated. 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   Dizziness Associated symptoms include nausea. Pertinent negatives include no vomiting.      Past Medical History  Diagnosis Date  . Sturge-Weber syndrome with glaucoma   . Infection     uti  . Seizures     "small ones as child"  . Anemia   . [id number](478.2Headache(784.0)     Past Surgical History  Procedure Laterality Date  . Eye surgery  1977  . Toe surgery  1977    Family History  Problem Relation Age of Onset  . Heart disease Mother   . Diabetes Maternal Aunt   . Diabetes Maternal Grandmother   . Hearing loss Neg Hx     History  Substance Use Topics  . Smoking status: Never Smoker   . Smokeless tobacco: Never Used  . Alcohol Use: Yes     Comment: occasional before pregnancy    Allergies:  Allergies  Allergen Reactions  . Other Other (See Comments)    Walnuts, causes tongue to get sores    Prescriptions prior to admission  Medication Sig Dispense Refill  . Doxylamine-Pyridoxine 10-10 MG TBEC Take 2 tabs daily at bedtime.  If symptoms persist, add 1 tab in the am, then another tablet in afternoon.  60 tablet  2  . hydrocortisone cream 1 % Apply to affected area 2 times daily  30 g  1  . loratadine (CLARITIN) 10 MG tablet Take 10 mg by mouth daily.      . Prenatal Vit-Fe Fumarate-FA (PRENATAL VITAMINS PLUS PO) Take 1 tablet by mouth daily.        Review of Systems  Constitutional: Negative.   HENT: Negative.   Eyes: Negative.   Respiratory: Negative.    Cardiovascular: Negative.   Gastrointestinal: Positive for nausea. Negative for vomiting.  Genitourinary: Negative.   Musculoskeletal: Negative.   Skin: Negative.   Neurological: Positive for dizziness.  Psychiatric/Behavioral: The patient is nervous/anxious.    Physical Exam   Blood pressure 126/68, pulse 90, temperature 98.2 F (36.8 C), temperature source Oral, resp. rate 16, height 5' 1.5" (1.562 m), weight 116.393 kg (256 lb 9.6 oz), last menstrual period 11/23/2013, SpO2 100.00%.  Physical Exam  Constitutional: She is oriented to person, place, and time. She appears well-developed and well-nourished. No distress.  HENT:  glaucoma in left eye  Cardiovascular: Normal rate, regular rhythm and normal heart sounds.   Respiratory: Effort normal and breath sounds normal. No respiratory distress. She has no wheezes. She has no rales.  Genitourinary:  Not examined  Musculoskeletal: Normal range of motion.  Neurological: She is alert and oriented to person, place, and time. Coordination normal.  Skin: Skin is warm and dry.  Psychiatric: She has a normal mood and affect. Her behavior is normal. Judgment and thought content normal.   Results for orders placed during the hospital encounter of 04/11/14 (from the past 24 hour(s))  URINALYSIS, ROUTINE W REFLEX MICROSCOPIC     Status:  None   Collection Time    04/11/14  2:25 PM      Result Value Ref Range   Color, Urine YELLOW  YELLOW   APPearance CLEAR  CLEAR   Specific Gravity, Urine 1.025  1.005 - 1.030   pH 6.5  5.0 - 8.0   Glucose, UA NEGATIVE  NEGATIVE mg/dL   Hgb urine dipstick NEGATIVE  NEGATIVE   Bilirubin Urine NEGATIVE  NEGATIVE   Ketones, ur NEGATIVE  NEGATIVE mg/dL   Protein, ur NEGATIVE  NEGATIVE mg/dL   Urobilinogen, UA 0.2  0.0 - 1.0 mg/dL   Nitrite NEGATIVE  NEGATIVE   Leukocytes, UA NEGATIVE  NEGATIVE   Results for orders placed during the hospital encounter of 04/11/14 (from the past 24 hour(s))  URINALYSIS,  ROUTINE W REFLEX MICROSCOPIC     Status: None   Collection Time    04/11/14  2:25 PM      Result Value Ref Range   Color, Urine YELLOW  YELLOW   APPearance CLEAR  CLEAR   Specific Gravity, Urine 1.025  1.005 - 1.030   pH 6.5  5.0 - 8.0   Glucose, UA NEGATIVE  NEGATIVE mg/dL   Hgb urine dipstick NEGATIVE  NEGATIVE   Bilirubin Urine NEGATIVE  NEGATIVE   Ketones, ur NEGATIVE  NEGATIVE mg/dL   Protein, ur NEGATIVE  NEGATIVE mg/dL   Urobilinogen, UA 0.2  0.0 - 1.0 mg/dL   Nitrite NEGATIVE  NEGATIVE   Leukocytes, UA NEGATIVE  NEGATIVE  COMPREHENSIVE METABOLIC PANEL     Status: Abnormal   Collection Time    04/11/14  3:23 PM      Result Value Ref Range   Sodium 136 (*) 137 - 147 mEq/L   Potassium 4.0  3.7 - 5.3 mEq/L   Chloride 100  96 - 112 mEq/L   CO2 24  19 - 32 mEq/L   Glucose, Bld 87  70 - 99 mg/dL   BUN 12  6 - 23 mg/dL   Creatinine, Ser 1.610.61  0.50 - 1.10 mg/dL   Calcium 9.1  8.4 - 09.610.5 mg/dL   Total Protein 6.2  6.0 - 8.3 g/dL   Albumin 2.8 (*) 3.5 - 5.2 g/dL   AST 31  0 - 37 U/L   ALT 20  0 - 35 U/L   Alkaline Phosphatase 37 (*) 39 - 117 U/L   Total Bilirubin <0.2 (*) 0.3 - 1.2 mg/dL   GFR calc non Af Amer >90  >90 mL/min   GFR calc Af Amer >90  >90 mL/min     MAU Course  Procedures  MDM  Will check CMET/UA Phenergan 25 mg po for nausea and dizziness/ helped  Assessment and Plan   A: Pregnancy Vertigo  P: Phenergan 25 mg po q6 hours prn nausea and vertigo Advised to stay hydrated/ rest Keep OB appointment on June 6th or be seen earlier if needed  Misty Little 04/11/2014, 3:16 PM

## 2014-04-11 NOTE — Discharge Instructions (Signed)
Vertigo Vertigo means you feel like you or your surroundings are moving when they are not. Vertigo can be dangerous if it occurs when you are at work, driving, or performing difficult activities.  CAUSES  Vertigo occurs when there is a conflict of signals sent to your brain from the visual and sensory systems in your body. There are many different causes of vertigo, including:  Infections, especially in the inner ear.  A bad reaction to a drug or misuse of alcohol and medicines.  Withdrawal from drugs or alcohol.  Rapidly changing positions, such as lying down or rolling over in bed.  A migraine headache.  Decreased blood flow to the brain.  Increased pressure in the brain from a head injury, infection, tumor, or bleeding. SYMPTOMS  You may feel as though the world is spinning around or you are falling to the ground. Because your balance is upset, vertigo can cause nausea and vomiting. You may have involuntary eye movements (nystagmus). DIAGNOSIS  Vertigo is usually diagnosed by physical exam. If the cause of your vertigo is unknown, your caregiver may perform imaging tests, such as an MRI scan (magnetic resonance imaging). TREATMENT  Most cases of vertigo resolve on their own, without treatment. Depending on the cause, your caregiver may prescribe certain medicines. If your vertigo is related to body position issues, your caregiver may recommend movements or procedures to correct the problem. In rare cases, if your vertigo is caused by certain inner ear problems, you may need surgery. HOME CARE INSTRUCTIONS   Follow your caregiver's instructions.  Avoid driving.  Avoid operating heavy machinery.  Avoid performing any tasks that would be dangerous to you or others during a vertigo episode.  Tell your caregiver if you notice that certain medicines seem to be causing your vertigo. Some of the medicines used to treat vertigo episodes can actually make them worse in some people. SEEK  IMMEDIATE MEDICAL CARE IF:   Your medicines do not relieve your vertigo or are making it worse.  You develop problems with talking, walking, weakness, or using your arms, hands, or legs.  You develop severe headaches.  Your nausea or vomiting continues or gets worse.  You develop visual changes.  A family member notices behavioral changes.  Your condition gets worse. MAKE SURE YOU:  Understand these instructions.  Will watch your condition.  Will get help right away if you are not doing well or get worse. Document Released: 08/26/2005 Document Revised: 02/08/2012 Document Reviewed: 06/04/2011 [employer] Patient Information 2014 [employer].  

## 2014-04-11 NOTE — MAU Note (Signed)
Dizzy spells in last 24 hours.  Hx of same prior to preg- had work up, cause unknown.

## 2014-04-11 NOTE — MAU Note (Signed)
Patient states she had an episode of feeling dizzy last night that went away. States she has had several episodes of feeling dizzy and shakey today. Denies, bleeding, leaking. Denies nausea, vomiting or diarrhea.

## 2014-04-17 ENCOUNTER — Telehealth: Payer: Self-pay | Admitting: *Deleted

## 2014-04-17 DIAGNOSIS — Q858 Other phakomatoses, not elsewhere classified: Secondary | ICD-10-CM

## 2014-04-17 DIAGNOSIS — Q8589 Other phakomatoses, not elsewhere classified: Secondary | ICD-10-CM

## 2014-04-17 NOTE — Telephone Encounter (Signed)
Pt left message stating that she had MFM appt 2 weeks ago and was told she needed MRI due to Sturge-Weber syndrome but had to be ordered by her primary doctor. She has not heard anything about the MRI and wants to know what she needs to do or who to talk to about it.

## 2014-04-18 NOTE — Telephone Encounter (Signed)
Patient called back. I reviewed her ultrasound from MFM and it says to consider an MRI. I advised patient that I would need to let a provider look at her scan and advise on what they would like to order. Patient agreeable to this. I told her we would call back as soon as we have more information.

## 2014-04-19 NOTE — Telephone Encounter (Addendum)
Dr. Macon LargeAnyanwu reviewed report from [facility]FM and recommended that patient have opthalmology referral and neurology referral. She does not want to order MRI at this time, will wait for neurology consult.

## 2014-04-24 NOTE — Telephone Encounter (Signed)
Made appt with [facility] 6/23 @ 1:15. Called patient and informed her of appt date and location. Patient verbalized understanding and already aware of neurology appt. Patient had no further questions

## 2014-05-02 ENCOUNTER — Ambulatory Visit: Payer: BC Managed Care – PPO | Admitting: Neurology

## 2014-05-02 ENCOUNTER — Ambulatory Visit (INDEPENDENT_AMBULATORY_CARE_PROVIDER_SITE_OTHER): Payer: BC Managed Care – PPO | Admitting: Advanced Practice Midwife

## 2014-05-02 VITALS — BP 120/74 | HR 86 | Temp 97.8°F | Wt 261.9 lb

## 2014-05-02 DIAGNOSIS — L299 Pruritus, unspecified: Secondary | ICD-10-CM

## 2014-05-02 DIAGNOSIS — Q8589 Other phakomatoses, not elsewhere classified: Secondary | ICD-10-CM

## 2014-05-02 DIAGNOSIS — IMO0002 Reserved for concepts with insufficient information to code with codable children: Secondary | ICD-10-CM

## 2014-05-02 DIAGNOSIS — Q858 Other phakomatoses, not elsewhere classified: Secondary | ICD-10-CM

## 2014-05-02 DIAGNOSIS — O09529 Supervision of elderly multigravida, unspecified trimester: Secondary | ICD-10-CM

## 2014-05-02 DIAGNOSIS — Q859 Phakomatosis, unspecified: Secondary | ICD-10-CM

## 2014-05-02 LAB — POCT URINALYSIS DIP (DEVICE)
Bilirubin Urine: NEGATIVE
Glucose, UA: NEGATIVE mg/dL
Hgb urine dipstick: NEGATIVE
Ketones, ur: 15 mg/dL — AB
Nitrite: NEGATIVE
Protein, ur: NEGATIVE mg/dL
Specific Gravity, Urine: 1.025 (ref 1.005–1.030)
Urobilinogen, UA: 0.2 mg/dL (ref 0.0–1.0)
pH: 6 (ref 5.0–8.0)

## 2014-05-02 NOTE — Progress Notes (Signed)
Pt reports sharp pain in center of abd last night, lasted short interval

## 2014-05-02 NOTE — Progress Notes (Signed)
Doing well.  Good fetal movement, denies vaginal bleeding, LOF, regular contractions.  Had one sharp pain yesterday in her abdomen that lasted a few seconds and resolved. No other abdominal pain or symptoms.  Pt has appt with ophthamology and neurology, then plan is to see MFM for delivery recommendations.  Pt having all over body pruritis. Did have rash on abdomen which has resolved but itching is generalized.  Has always had sensitive skin and itching but worse in last week. Bile acids ordered.

## 2014-05-04 ENCOUNTER — Encounter: Payer: Self-pay | Admitting: Neurology

## 2014-05-04 ENCOUNTER — Ambulatory Visit (INDEPENDENT_AMBULATORY_CARE_PROVIDER_SITE_OTHER): Payer: BC Managed Care – PPO | Admitting: Neurology

## 2014-05-04 VITALS — BP 104/68 | HR 80 | Resp 18 | Ht 61.75 in | Wt 263.0 lb

## 2014-05-04 DIAGNOSIS — Q859 Phakomatosis, unspecified: Secondary | ICD-10-CM

## 2014-05-04 DIAGNOSIS — R42 Dizziness and giddiness: Secondary | ICD-10-CM | POA: Insufficient documentation

## 2014-05-04 DIAGNOSIS — Q858 Other phakomatoses, not elsewhere classified: Secondary | ICD-10-CM

## 2014-05-04 DIAGNOSIS — H42 Glaucoma in diseases classified elsewhere: Principal | ICD-10-CM

## 2014-05-04 DIAGNOSIS — H409 Unspecified glaucoma: Secondary | ICD-10-CM

## 2014-05-04 LAB — BILE ACIDS, TOTAL: Bile Acids Total: 9 umol/L (ref 0–19)

## 2014-05-04 NOTE — Patient Instructions (Signed)
1. MRI brain without contrast 2. Routine EEG 3. Have a safe pregnancy!

## 2014-05-04 NOTE — Progress Notes (Signed)
NEUROLOGY CONSULTATION NOTE  Misty Little MRN: [id number] DOB: 1976/03/10  Referring provider: Sharen CounterLisa Leftwich-Kirby, CNM Primary care provider: Dr. Dortha Kernennis Chrismon  Reason for consult:  Neurological evaluation for pregnancy in Sturge-Weber syndrome  Thank you for your kind referral of Misty Millinerina Stowell for consultation of the above symptoms. Although her history is well known to you, please allow me to reiterate it for the purpose of our medical record. Records and images were personally reviewed where available.   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  She does not recall any imaging, probably done in infancy.  She had been seen by [facility] and was advised to see neurology and ophthalmology for delivery planning.  She has had several surgeries for right eye glaucoma, [place].  She appears neurologically intact except for facial symptoms, she denies any cognitive deficits, no focal weakness/numbness/tingling, no history of seizures except possibly when she was in high school and would become unresponsive.  She denies taking anti-seizure medications in the past.  She denies any episodes of staring/unresponsiveness, gaps in time, olfactory/gustatory hallucinations, focal numbness/tingling/weakness, generalized convulsions, myoclonic jerks.  She does have a history of recurrent episodes of brief dizziness lasting only a few seconds, described as brief spinning with no associated nausea/vomiting/focal symptoms.  She feels slightly off balance when these occur, which started even before the pregnancy.  She also used to have frequent headaches diagnosed as possible migraines with throbbing on the right hemisphere, with some photo and phonophobia where she would need to go to sleep or take Goody powder, with good  effect.  The headaches last for an hour, no associated nausea/vomiting, no visual aura.  She was having them from once a week to once a month, however after discontinuing birth control pills for this pregnancy, the headaches have resolved.    She denies any diplopia, dysarthria, dysphagia, neck pain, bowel/bladder dysfunction.  She has some back pain with the pregnancy.  Her arches in both feet hurt, relieved with Dr. Margart SicklesScholl's inserts.  She has chronic pruritis, feeling "like something is on me."  There is no family history of Sturge-Weber syndrome, no family history of seizures.  She denies any history of febrile convulsions, CNS infections, significant traumatic brain injury, or neurosurgical procedures.  Laboratory Data: Component     Latest Ref Rng 04/11/2014 05/02/2014  Sodium     137 - 147 mEq/L 136 (L)   Potassium     3.7 - 5.3 mEq/L 4.0   Chloride     96 - 112 mEq/L 100   CO2     19 - 32 mEq/L 24   Glucose     70 - 99 mg/dL 87   BUN     6 - 23 mg/dL 12   Creatinine     7.820.50 - 1.10 mg/dL 9.560.61   Calcium     8.4 - 10.5 mg/dL 9.1   Total Protein     6.0 - 8.3 g/dL 6.2   Albumin     3.5 - 5.2 g/dL 2.8 (L)   AST     0 - 37 U/L 31   ALT     0 - 35 U/L 20   Alkaline Phosphatase     39 - 117 U/L 37 (L)   Total Bilirubin  0.3 - 1.2 mg/dL <2.3 (L)   GFR calc non Af Amer     >90 mL/min >90   GFR calc Af Amer     >90 mL/min >90   Bile Acids Total     0 - 19 umol/L  9    PAST MEDICAL HISTORY: Past Medical History  Diagnosis Date  . Sturge-Weber syndrome with glaucoma   . Infection     uti  . Seizures     "small ones as child"  . Anemia   . Headache(784.0)     PAST SURGICAL HISTORY: Past Surgical History  Procedure Laterality Date  . Eye surgery  1977  . Toe surgery  1977    MEDICATIONS: Current Outpatient Prescriptions on File Prior to Visit  Medication Sig Dispense Refill  . calcium carbonate (TUMS - DOSED IN MG ELEMENTAL CALCIUM) 500 MG chewable tablet  Chew 1 tablet by mouth 2 (two) times daily. As needed      . Prenatal Vit-Fe Fumarate-FA (PRENATAL VITAMINS PLUS PO) Take 1 tablet by mouth daily.      . promethazine (PHENERGAN) 25 MG tablet Take 1 tablet (25 mg total) by mouth every 6 (six) hours as needed for nausea or vomiting.  30 tablet  0   No current facility-administered medications on file prior to visit.    ALLERGIES: Allergies  Allergen Reactions  . Other Other (See Comments)    Walnuts, causes tongue to get sores    FAMILY HISTORY: Family History  Problem Relation Age of Onset  . Heart disease Mother   . Diabetes Maternal Aunt   . Diabetes Maternal Grandmother   . Hearing loss Neg Hx     SOCIAL HISTORY: History   Social History  . Marital Status: Married    Spouse Name: N/A    Number of Children: N/A  . Years of Education: N/A   Occupational History  . Not on file.   Social History Main Topics  . Smoking status: Never Smoker   . Smokeless tobacco: Never Used  . Alcohol Use: No     Comment: occasional before pregnancy  . Drug Use: No  . Sexual Activity: Yes    Birth Control/ Protection: None   Other Topics Concern  . Not on file   Social History Narrative  . No narrative on file    REVIEW OF SYSTEMS: Constitutional: No fevers, chills, or sweats, no generalized fatigue, change in appetite Eyes: as above Ear, nose and throat: No hearing loss, ear pain, nasal congestion, sore throat Cardiovascular: No chest pain, palpitations Respiratory:  No shortness of breath at rest or with exertion, wheezes GastrointestinaI: No nausea, vomiting, diarrhea, abdominal pain, fecal incontinence Genitourinary:  No dysuria, urinary retention or frequency Musculoskeletal:  No neck pain, +back pain Integumentary: No rash, pruritus, + skin lesions (port wine stain on right side of face) Neurological: as above Psychiatric: No depression, insomnia, anxiety Endocrine: No palpitations, fatigue, diaphoresis, mood swings,  change in appetite, change in weight, increased thirst Hematologic/Lymphatic:  No anemia, purpura, petechiae. Allergic/Immunologic: no itchy/runny eyes, nasal congestion, recent allergic reactions, rashes  PHYSICAL EXAM: Filed Vitals:   05/04/14 1440  BP: 104/68  Pulse: 80  Resp: 18   General: No acute distress Head:  Normocephalic/atraumatic. Port-wine stain on right side of face from mid-parietal region and vertex of scalp up to midline of right upper lip with overgrowth of the underlying soft tissues.  Eyes: Fundoscopic exam shows bilateral sharp discs, no vessel changes, exudates, or hemorrhages  Neck: supple, no paraspinal tenderness, full range of motion Back: No paraspinal tenderness Heart: regular rate and rhythm Lungs: Clear to auscultation bilaterally. Vascular: No carotid bruits. Skin/Extremities: No rash, no edema Neurological Exam: Mental status: alert and oriented to person, place, and time, no dysarthria or aphasia, Fund of knowledge is appropriate.  Recent and remote memory are intact.  Attention and concentration are normal.    Able to name objects and repeat phrases. Cranial nerves: CN I: not tested CN II: pupils equal, round and reactive to light, visual fields intact, fundi unremarkable. CN III, IV, VI:  full range of motion, no nystagmus, no ptosis CN V: decreased cold on the right V2, intact to pin CN VII: upper and lower face symmetric CN VIII: hearing intact to finger rub CN IX, X: gag intact, uvula midline CN XI: sternocleidomastoid and trapezius muscles intact CN XII: tongue midline Bulk & Tone: normal, no fasciculations. Motor: 5/5 throughout with no pronator drift. Sensation: intact to light touch, cold, pin, vibration and joint position sense.  No extinction to double simultaneous stimulation.  Romberg test negative Deep Tendon Reflexes: +2 throughout, no ankle clonus Plantar responses: downgoing bilaterally Cerebellar: no incoordination on finger to  nose, heel to shin. No dysdiadochokinesia Gait: wide-based and steady, mild difficulty with tandem walk but able  Tremor: none  IMPRESSION: This is a pleasant 38 year old right-handed [redacted] weeks pregnant woman with a history of Sturge-Weber syndrome with glaucoma but no clear focal neurological abnormalities on exam, no hemiparesis or cognitive deficits.  Seizures have been reported to occur in 80% of patients with Sturge-Weber syndrome, she may have had seizures as a child, but none after high school, and not on anti-seizure medication.  She does have episodes of dizziness, unclear if related to seizure activity versus hypoperfusion.  An MRI brain without contrast (contrast should be avoided in pregnancy unless absolutely essential) and routine EEG will be ordered.  The treatment of Sturge-Weber syndrome in pregnancy is essentially symptomatic, and she is stable from a neurologic standpoint at this time.  MRI brain will help to further delineate extent of cerebral involvement to help plan for anesthetic and delivery management.  She will follow-up after the delivery, at which point we can repeat an MRI brain with contrast.  Thank you for allowing me to participate in the care of this patient. Please do not hesitate to call for any questions or concerns.   Patrcia Dolly, M.D.  CC: [facility]: fax [phone]

## 2014-05-15 ENCOUNTER — Encounter (HOSPITAL_COMMUNITY): Payer: Self-pay

## 2014-05-15 ENCOUNTER — Other Ambulatory Visit: Payer: Self-pay | Admitting: Advanced Practice Midwife

## 2014-05-15 ENCOUNTER — Ambulatory Visit (HOSPITAL_COMMUNITY)
Admission: RE | Admit: 2014-05-15 | Discharge: 2014-05-15 | Disposition: A | Discharge status: Home / Self Care | Payer: BC Managed Care – PPO | Source: Ambulatory Visit | Attending: Family Medicine | Admitting: Family Medicine

## 2014-05-15 VITALS — BP 118/72 | HR 97 | Wt 266.8 lb

## 2014-05-15 DIAGNOSIS — O352XX0 Maternal care for (suspected) hereditary disease in fetus, not applicable or unspecified: Secondary | ICD-10-CM | POA: Insufficient documentation

## 2014-05-15 DIAGNOSIS — IMO0002 Reserved for concepts with insufficient information to code with codable children: Secondary | ICD-10-CM

## 2014-05-15 DIAGNOSIS — O99210 Obesity complicating pregnancy, unspecified trimester: Secondary | ICD-10-CM

## 2014-05-15 DIAGNOSIS — O09529 Supervision of elderly multigravida, unspecified trimester: Secondary | ICD-10-CM | POA: Insufficient documentation

## 2014-05-15 DIAGNOSIS — E669 Obesity, unspecified: Secondary | ICD-10-CM

## 2014-05-22 ENCOUNTER — Ambulatory Visit (INDEPENDENT_AMBULATORY_CARE_PROVIDER_SITE_OTHER): Payer: BC Managed Care – PPO | Admitting: Neurology

## 2014-05-22 ENCOUNTER — Encounter (HOSPITAL_COMMUNITY): Payer: Self-pay

## 2014-05-22 DIAGNOSIS — Q858 Other phakomatoses, not elsewhere classified: Secondary | ICD-10-CM

## 2014-05-22 DIAGNOSIS — H42 Glaucoma in diseases classified elsewhere: Secondary | ICD-10-CM

## 2014-05-22 DIAGNOSIS — Q8589 Other phakomatoses, not elsewhere classified: Secondary | ICD-10-CM

## 2014-05-22 DIAGNOSIS — R42 Dizziness and giddiness: Secondary | ICD-10-CM

## 2014-05-23 ENCOUNTER — Ambulatory Visit (HOSPITAL_COMMUNITY): Admission: RE | Admit: 2014-05-23 | Payer: BC Managed Care – PPO | Source: Ambulatory Visit

## 2014-05-23 NOTE — Procedures (Signed)
ELECTROENCEPHALOGRAM REPORT  Date of Study: 05/22/2014  Patient's Name: Misty Little MRN: [id number] Date of Birth: 02-Jan-1976  Referring Provider: Dr. Patrcia DollyKaren Aquino  Clinical History: This is a 38 year old woman with a history of Sturge-Weber syndrome with glaucoma, seizures as a child, but none after high school, and not on anti-seizure medication. She has episodes of dizziness.  Medications: Prenatal vitamins  Technical Summary: A multichannel digital EEG recording measured by the international 10-20 system with electrodes applied with paste and impedances below 5000 ohms performed in our laboratory with EKG monitoring in an awake and asleep patient.  Hyperventilation was not performed. Photic stimulation was performed.  The digital EEG was referentially recorded, reformatted, and digitally filtered in a variety of bipolar and referential montages for optimal display.  Spike detection software was employed.  Description: The patient is awake and asleep during the recording.  During maximal wakefulness, there is a symmetric, medium voltage 10 Hz posterior dominant rhythm that attenuates with eye opening.  During drowsiness and sleep, there is an increase in theta slowing of the background. There is asymmetry noted in the background with loss of faster frequencies and lower amplitude activity seen over the right hemisphere.  Vertex waves and symmetric sleep spindles were seen.  Hyperventilation was not performed. Photic stimulation did not elicit any abnormalities. There were no epileptiform discharges or electrographic seizures seen.    EKG lead was unremarkable.  Impression: This awake and asleep EEG is abnormal due to background asymmetry noted over the right hemisphere.  Clinical Correlation of the above findings indicates focal cerebral dysfunction over the right hemisphere suggestive of underlying structural or physiologic abnormality.  The absence of epileptiform discharges does not rule  out a clinical diagnosis of epilepsy.  Clinical correlation is advised.   Patrcia DollyKaren Aquino, M.D.

## 2014-05-24 ENCOUNTER — Ambulatory Visit (HOSPITAL_COMMUNITY)
Admission: RE | Admit: 2014-05-24 | Discharge: 2014-05-24 | Disposition: A | Discharge status: Home / Self Care | Payer: BC Managed Care – PPO | Source: Ambulatory Visit | Attending: Neurology | Admitting: Neurology

## 2014-05-24 DIAGNOSIS — Q858 Other phakomatoses, not elsewhere classified: Secondary | ICD-10-CM

## 2014-05-24 DIAGNOSIS — Z331 Pregnant state, incidental: Secondary | ICD-10-CM | POA: Insufficient documentation

## 2014-05-24 DIAGNOSIS — Q8589 Other phakomatoses, not elsewhere classified: Secondary | ICD-10-CM

## 2014-05-24 DIAGNOSIS — J3489 Other specified disorders of nose and nasal sinuses: Secondary | ICD-10-CM | POA: Insufficient documentation

## 2014-05-24 DIAGNOSIS — H42 Glaucoma in diseases classified elsewhere: Secondary | ICD-10-CM

## 2014-05-24 DIAGNOSIS — R42 Dizziness and giddiness: Secondary | ICD-10-CM | POA: Insufficient documentation

## 2014-05-24 DIAGNOSIS — G379 Demyelinating disease of central nervous system, unspecified: Secondary | ICD-10-CM | POA: Insufficient documentation

## 2014-05-24 DIAGNOSIS — R569 Unspecified convulsions: Secondary | ICD-10-CM | POA: Insufficient documentation

## 2014-05-30 ENCOUNTER — Ambulatory Visit (INDEPENDENT_AMBULATORY_CARE_PROVIDER_SITE_OTHER): Payer: BC Managed Care – PPO | Admitting: Advanced Practice Midwife

## 2014-05-30 ENCOUNTER — Encounter: Payer: Self-pay | Admitting: Advanced Practice Midwife

## 2014-05-30 ENCOUNTER — Telehealth: Payer: Self-pay | Admitting: Family Medicine

## 2014-05-30 VITALS — BP 130/84 | HR 80 | Temp 97.9°F | Wt 268.3 lb

## 2014-05-30 DIAGNOSIS — Z3480 Encounter for supervision of other normal pregnancy, unspecified trimester: Secondary | ICD-10-CM

## 2014-05-30 DIAGNOSIS — O3090 Multiple gestation, unspecified, unspecified trimester: Secondary | ICD-10-CM

## 2014-05-30 DIAGNOSIS — Z23 Encounter for immunization: Secondary | ICD-10-CM

## 2014-05-30 DIAGNOSIS — O360990 Maternal care for other rhesus isoimmunization, unspecified trimester, not applicable or unspecified: Secondary | ICD-10-CM

## 2014-05-30 DIAGNOSIS — O360121 Maternal care for anti-D [Rh] antibodies, second trimester, fetus 1: Secondary | ICD-10-CM

## 2014-05-30 DIAGNOSIS — Z3492 Encounter for supervision of normal pregnancy, unspecified, second trimester: Secondary | ICD-10-CM

## 2014-05-30 LAB — CBC
HCT: 35.8 % — ABNORMAL LOW (ref 36.0–46.0)
Hemoglobin: 12.1 g/dL (ref 12.0–15.0)
MCH: 30.2 pg (ref 26.0–34.0)
MCHC: 33.8 g/dL (ref 30.0–36.0)
MCV: 89.3 fL (ref 78.0–100.0)
Platelets: 189 10*3/uL (ref 150–400)
RBC: 4.01 MIL/uL (ref 3.87–5.11)
RDW: 14.2 % (ref 11.5–15.5)
WBC: 8.7 10*3/uL (ref 4.0–10.5)

## 2014-05-30 LAB — POCT URINALYSIS DIP (DEVICE)
Bilirubin Urine: NEGATIVE
Glucose, UA: NEGATIVE mg/dL
Hgb urine dipstick: NEGATIVE
Ketones, ur: NEGATIVE mg/dL
Leukocytes, UA: NEGATIVE
Nitrite: NEGATIVE
Protein, ur: NEGATIVE mg/dL
Specific Gravity, Urine: 1.025 (ref 1.005–1.030)
Urobilinogen, UA: 0.2 mg/dL (ref 0.0–1.0)
pH: 7 (ref 5.0–8.0)

## 2014-05-30 MED ORDER — TETANUS-DIPHTH-ACELL PERTUSSIS 5-2.5-18.5 LF-MCG/0.5 IM SUSP: 0.5000 mL | Freq: Once | INTRAMUSCULAR | Status: DC

## 2014-05-30 MED ORDER — RHO D IMMUNE GLOBULIN 1500 UNIT/2ML IJ SOSY
300.0000 ug | PREFILLED_SYRINGE | Freq: Once | INTRAMUSCULAR | Status: AC
  Administered 2014-05-30: 300 ug via INTRAMUSCULAR

## 2014-05-30 NOTE — Telephone Encounter (Signed)
Spoke with patient. Did notify her of results of mri/eeg.

## 2014-05-30 NOTE — Progress Notes (Signed)
Called patient, no answer. Couldn't leave msg vm not set up. Will try again later.

## 2014-05-30 NOTE — Progress Notes (Signed)
Doing well. Reviewed MRI/EEg results briefly. Will discuss further when she sees MFM 06/12/14.  Glucola and Rhophylac today.

## 2014-05-30 NOTE — Patient Instructions (Signed)
Second Trimester of Pregnancy The second trimester is from week 13 through week 28, months 4 through 6. The second trimester is often a time when you feel your best. Your body has also adjusted to being pregnant, and you begin to feel better physically. Usually, morning sickness has lessened or quit completely, you may have more energy, and you may have an increase in appetite. The second trimester is also a time when the fetus is growing rapidly. At the end of the sixth month, the fetus is about 9 inches long and weighs about 1 pounds. You will likely begin to feel the baby move (quickening) between 18 and 20 weeks of the pregnancy. BODY CHANGES Your body goes through many changes during pregnancy. The changes vary from woman to woman.   Your weight will continue to increase. You will notice your lower abdomen bulging out.  You may begin to get stretch marks on your hips, abdomen, and breasts.  You may develop headaches that can be relieved by medicines approved by your health care provider.  You may urinate more often because the fetus is pressing on your bladder.  You may develop or continue to have heartburn as a result of your pregnancy.  You may develop constipation because certain hormones are causing the muscles that push waste through your intestines to slow down.  You may develop hemorrhoids or swollen, bulging veins (varicose veins).  You may have back pain because of the weight gain and pregnancy hormones relaxing your joints between the bones in your pelvis and as a result of a shift in weight and the muscles that support your balance.  Your breasts will continue to grow and be tender.  Your gums may bleed and may be sensitive to brushing and flossing.  Dark spots or blotches (chloasma, mask of pregnancy) may develop on your face. This will likely fade after the baby is born.  A dark line from your belly button to the pubic area (linea nigra) may appear. This will likely fade  after the baby is born.  You may have changes in your hair. These can include thickening of your hair, rapid growth, and changes in texture. Some women also have hair loss during or after pregnancy, or hair that feels dry or thin. Your hair will most likely return to normal after your baby is born. WHAT TO EXPECT AT YOUR PRENATAL VISITS During a routine prenatal visit:  You will be weighed to make sure you and the fetus are growing normally.  Your blood pressure will be taken.  Your abdomen will be measured to track your baby's growth.  The fetal heartbeat will be listened to.  Any test results from the previous visit will be discussed. Your health care provider may ask you:  How you are feeling.  If you are feeling the baby move.  If you have had any abnormal symptoms, such as leaking fluid, bleeding, severe headaches, or abdominal cramping.  If you have any questions. Other tests that may be performed during your second trimester include:  Blood tests that check for:  Low iron levels (anemia).  Gestational diabetes (between 24 and 28 weeks).  Rh antibodies.  Urine tests to check for infections, diabetes, or protein in the urine.  An ultrasound to confirm the proper growth and development of the baby.  An amniocentesis to check for possible genetic problems.  Fetal screens for spina bifida and Down syndrome. HOME CARE INSTRUCTIONS   Avoid all smoking, herbs, alcohol, and unprescribed   drugs. These chemicals affect the formation and growth of the baby.  Follow your health care provider's instructions regarding medicine use. There are medicines that are either safe or unsafe to take during pregnancy.  Exercise only as directed by your health care provider. Experiencing uterine cramps is a good sign to stop exercising.  Continue to eat regular, healthy meals.  Wear a good support bra for breast tenderness.  Do not use hot tubs, steam rooms, or saunas.  Wear your  seat belt at all times when driving.  Avoid raw meat, uncooked cheese, cat litter boxes, and soil used by cats. These carry germs that can cause birth defects in the baby.  Take your prenatal vitamins.  Try taking a stool softener (if your health care provider approves) if you develop constipation. Eat more high-fiber foods, such as fresh vegetables or fruit and whole grains. Drink plenty of fluids to keep your urine clear or pale yellow.  Take warm sitz baths to soothe any pain or discomfort caused by hemorrhoids. Use hemorrhoid cream if your health care provider approves.  If you develop varicose veins, wear support hose. Elevate your feet for 15 minutes, 3-4 times a day. Limit salt in your diet.  Avoid heavy lifting, wear low heel shoes, and practice good posture.  Rest with your legs elevated if you have leg cramps or low back pain.  Visit your dentist if you have not gone yet during your pregnancy. Use a soft toothbrush to brush your teeth and be gentle when you floss.  A sexual relationship may be continued unless your health care provider directs you otherwise.  Continue to go to all your prenatal visits as directed by your health care provider. SEEK MEDICAL CARE IF:   You have dizziness.  You have mild pelvic cramps, pelvic pressure, or nagging pain in the abdominal area.  You have persistent nausea, vomiting, or diarrhea.  You have a bad smelling vaginal discharge.  You have pain with urination. SEEK IMMEDIATE MEDICAL CARE IF:   You have a fever.  You are leaking fluid from your vagina.  You have spotting or bleeding from your vagina.  You have severe abdominal cramping or pain.  You have rapid weight gain or loss.  You have shortness of breath with chest pain.  You notice sudden or extreme swelling of your face, hands, ankles, feet, or legs.  You have not felt your baby move in over an hour.  You have severe headaches that do not go away with  medicine.  You have vision changes. Document Released: 11/10/2001 Document Revised: 11/21/2013 Document Reviewed: 01/17/2013 Exit[employer] Patient Information 2015 [employer]. This information is not intended to replace advice given to you by your health care provider. Make sure you discuss any questions you have with your health care provider.  

## 2014-05-30 NOTE — Telephone Encounter (Signed)
Message copied by Franciso BendMCNEIL, Unity Luepke M on Wed May 30, 2014  4:35 PM ------      Message from: Van ClinesAQUINO, KAREN M      Created: Tue May 29, 2014  4:45 PM       Pls let her know brain looks good, no brain involvement from the Sturge-Weber. Her EEG looks good too, no evidence of seizure discharges. Thanks ------

## 2014-05-30 NOTE — Progress Notes (Signed)
Doing well. Some episodes of "spacing out".  Has MFM US scheduled July 14.  Neuro workup reviewed

## 2014-05-31 LAB — RPR

## 2014-05-31 LAB — GLUCOSE TOLERANCE, 1 HOUR (50G) W/O FASTING: Glucose, 1 Hour GTT: 135 mg/dL (ref 70–140)

## 2014-05-31 LAB — HIV ANTIBODY (ROUTINE TESTING W REFLEX): HIV 1&2 Ab, 4th Generation: NONREACTIVE

## 2014-06-04 ENCOUNTER — Telehealth: Payer: Self-pay | Admitting: Obstetrics & Gynecology

## 2014-06-04 ENCOUNTER — Encounter: Payer: Self-pay | Admitting: Advanced Practice Midwife

## 2014-06-04 NOTE — Telephone Encounter (Signed)
Patient called and stated she needed to reschedule because she had to work. When I gave her the appointment that was available she stated she was going out of town. Made appointment for the next one when she was going to be here.

## 2014-06-08 ENCOUNTER — Inpatient Hospital Stay (HOSPITAL_COMMUNITY)
Admission: AD | Admit: 2014-06-08 | Discharge: 2014-06-08 | Disposition: A | Discharge status: Home / Self Care | Payer: BC Managed Care – PPO | Source: Ambulatory Visit | Attending: Obstetrics & Gynecology | Admitting: Obstetrics & Gynecology

## 2014-06-08 ENCOUNTER — Encounter (HOSPITAL_COMMUNITY): Payer: Self-pay | Admitting: *Deleted

## 2014-06-08 DIAGNOSIS — R109 Unspecified abdominal pain: Secondary | ICD-10-CM | POA: Insufficient documentation

## 2014-06-08 DIAGNOSIS — Z8249 Family history of ischemic heart disease and other diseases of the circulatory system: Secondary | ICD-10-CM | POA: Insufficient documentation

## 2014-06-08 DIAGNOSIS — O99891 Other specified diseases and conditions complicating pregnancy: Secondary | ICD-10-CM | POA: Insufficient documentation

## 2014-06-08 DIAGNOSIS — N949 Unspecified condition associated with female genital organs and menstrual cycle: Secondary | ICD-10-CM

## 2014-06-08 DIAGNOSIS — O09529 Supervision of elderly multigravida, unspecified trimester: Secondary | ICD-10-CM | POA: Insufficient documentation

## 2014-06-08 DIAGNOSIS — O9989 Other specified diseases and conditions complicating pregnancy, childbirth and the puerperium: Principal | ICD-10-CM

## 2014-06-08 LAB — URINALYSIS, ROUTINE W REFLEX MICROSCOPIC
BILIRUBIN URINE: NEGATIVE
GLUCOSE, UA: NEGATIVE mg/dL
Hgb urine dipstick: NEGATIVE
Ketones, ur: NEGATIVE mg/dL
Leukocytes, UA: NEGATIVE
Nitrite: NEGATIVE
PH: 6.5 (ref 5.0–8.0)
Protein, ur: NEGATIVE mg/dL
SPECIFIC GRAVITY, URINE: 1.015 (ref 1.005–1.030)
Urobilinogen, UA: 0.2 mg/dL (ref 0.0–1.0)

## 2014-06-08 NOTE — MAU Note (Signed)
Pain started last night when rolled over, felt liked a pulled muscle in LLQ.  Is a dull ache now.

## 2014-06-08 NOTE — MAU Provider Note (Signed)
First Provider Initiated Contact with Patient 06/08/14 1018      Chief Complaint:  Abdominal Pain   Misty Little is  38 y.o. G2P0010 at [redacted]w[redacted]d presents complaining of Abdominal Pain .  She states none contractions are associated with none vaginal bleeding, intact membranes, along with active fetal movement. Pt. Presents complaining of L lower abdominal pain that started after having sex with her husband last night. She says that it feels like a muscle strain. She denies bleeding, LOF, or contractions. She is having + FM. She says that her pain was improved with tylenol, and that it was better this morning without any medication. She denies, dysuria, polyuria, back pain, nausea, vomiting, fever, or chills. She says that she has never had an STD. She denies discharge. She has no other complaints.   Obstetrical/Gynecological History: OB History   Grav Para Term Preterm Abortions TAB SAB Ect Mult Living   2 0 0 0 1 0 1 0 0 0      Past Medical History: Past Medical History  Diagnosis Date  . Sturge-Weber syndrome with glaucoma   . Infection     uti  . Seizures     "small ones as child"  . Anemia   . UEAVWUJW(119.1)     Past Surgical History: Past Surgical History  Procedure Laterality Date  . Eye surgery  1977  . Toe surgery  1977    Family History: Family History  Problem Relation Age of Onset  . Heart disease Mother   . Diabetes Maternal Aunt   . Diabetes Maternal Grandmother   . Hearing loss Neg Hx     Social History: History  Substance Use Topics  . Smoking status: Never Smoker   . Smokeless tobacco: Never Used  . Alcohol Use: No     Comment: occasional before pregnancy    Allergies:  Allergies  Allergen Reactions  . Other Other (See Comments)    Walnuts, causes tongue to get sores    Meds:  Facility-administered medications prior to admission  Medication Dose Route Frequency Provider Last Rate Last Dose  . Tdap (BOOSTRIX) injection 0.5 mL  0.5 mL  Intramuscular Once Aviva Signs, CNM       Prescriptions prior to admission  Medication Sig Dispense Refill  . acetaminophen (TYLENOL) 325 MG tablet Take 650 mg by mouth every 6 (six) hours as needed for mild pain or headache.      . calcium carbonate (TUMS - DOSED IN MG ELEMENTAL CALCIUM) 500 MG chewable tablet Chew 2 tablets by mouth 2 (two) times daily as needed for heartburn.       . diphenhydrAMINE (BENADRYL) 25 MG tablet Take 25 mg by mouth at bedtime as needed for allergies or sleep.       Marland Kitchen Doxylamine-Pyridoxine (DICLEGIS) 10-10 MG TBEC Take 1 tablet by mouth at bedtime.      . Prenatal Vit-Fe Fumarate-FA (PRENATAL VITAMINS PLUS PO) Take 1 tablet by mouth daily.        Review of Systems -  Per HPI    Physical Exam  Blood pressure 111/70, pulse 95, temperature 98.1 F (36.7 C), temperature source Oral, resp. rate 18, last menstrual period 11/23/2013. GENERAL: Well-developed, well-nourished female in no acute distress.  LUNGS: Clear to auscultation bilaterally.  HEART: Regular rate and rhythm. ABDOMEN: Soft, nontender, nondistended, gravid.  EXTREMITIES: Nontender, no edema, 2+ distal pulses. Neurologiclly grossly intact CERVICAL EXAM: Dilatation 0cm   Effacement 0%   Station high   Presentation: cephalic  FHT:  Baseline rate 135 bpm   Variability moderate  Accelerations present   Decelerations none Contractions: None   Labs: No results found for this or any previous visit (from the past 24 hour(s)). Imaging Studies:  Mr Brain Wo Contrast  05/24/2014   CLINICAL DATA:  Dizziness. Sturge-Weber syndrome with glaucoma. Seizures. [redacted] weeks pregnant.  EXAM: MRI HEAD WITHOUT CONTRAST  TECHNIQUE: Multiplanar, multiecho pulse sequences of the brain and surrounding structures were obtained without intravenous contrast.  COMPARISON:  None.  FINDINGS: Ventricle size is normal. Craniocervical junction normal. Negative for Chiari malformation. Pituitary not enlarged.  5 x 10 mm  hyperintensity in the left frontal periventricular white matter. No other white matter lesions are identified. Brainstem and cerebellum are normal.  Cerebral cortex is normal in signal and morphology without evidence of cortical calcification or other typical findings of Sturge-Weber syndrome. Cortical signal and volume is normal.  Negative for acute infarct.  Negative for hemorrhage or mass.  Hypoplastic right frontal sinus, a finding described in Sturge-Weber. Underlying right frontal lobe has normal signal and morphology without evidence of prior infarction.  Mild mucosal edema right maxillary sinus.  IMPRESSION: Hypoplastic right frontal sinus. No other signs of Sturge-Weber syndrome.  Left frontal white matter hyperintensity is nonspecific and could be seen with demyelinating disease. Chronic ischemia also in the differential.  No acute abnormality.   Electronically Signed   By: Marlan Palauharles  Clark M.D.   On: 05/24/2014 19:09   [place] [facility] Follow Up  05/15/2014   OBSTETRICAL ULTRASOUND: This exam was performed within a [place] [facility]. The OB US report was generated in the AS system, and faxed to the ordering physician.   This report is available in the [facility] [employer] PACS. See the [facility] Obstetric US report via the Image Link.   Assessment: Misty Little is  38 y.o. G2P0010 at 3050w1d presents with mild left lower abdominal / ligament pain.  Plan: 1. Abdominal pain - Likely a ligament strain vs muscle strain - Improving on its own - Cervical exam reassuring - OTC Tylenol prn pain - Advised patient to return to the ED if experiencing any LOF, Bleeding, Contractions, or Worsening of her pain.   Thanks for letting us take care of you!  Melancon, Hillery Hunteraleb G 7/10/201510:32 AM  I have seen and examined this patient and agree with above documentation in the resident's note. Pt presented with predominant msk pain after intercourse. Already improving by time of my exam. FWB - cat I tracing. D/c to home with  reassurance.    Rulon AbideKeli Brandi Armato, M.D. [facility] 06/08/2014 2:20 PM

## 2014-06-08 NOTE — MAU Provider Note (Signed)
Attestation of Attending Supervision of Obstetric Fellow: Evaluation and management procedures were performed by the Obstetric Fellow under my supervision and collaboration.  I have reviewed the Obstetric Fellow's note and chart, and I agree with the management and plan.  Jerline Linzy, MD, FACOG Attending Obstetrician & Gynecologist Faculty Practice, [facility]   

## 2014-06-08 NOTE — Discharge Instructions (Signed)

## 2014-06-12 ENCOUNTER — Ambulatory Visit (HOSPITAL_COMMUNITY)
Admission: RE | Admit: 2014-06-12 | Discharge: 2014-06-12 | Disposition: A | Discharge status: Home / Self Care | Payer: BC Managed Care – PPO | Source: Ambulatory Visit | Attending: Advanced Practice Midwife | Admitting: Advanced Practice Midwife

## 2014-06-12 ENCOUNTER — Encounter (HOSPITAL_COMMUNITY): Payer: Self-pay

## 2014-06-12 ENCOUNTER — Ambulatory Visit (HOSPITAL_COMMUNITY)
Admission: RE | Admit: 2014-06-12 | Discharge: 2014-06-12 | Disposition: A | Discharge status: Home / Self Care | Payer: BC Managed Care – PPO | Source: Ambulatory Visit | Attending: Family Medicine | Admitting: Family Medicine

## 2014-06-12 VITALS — BP 126/72 | HR 91 | Wt 274.0 lb

## 2014-06-12 DIAGNOSIS — O352XX0 Maternal care for (suspected) hereditary disease in fetus, not applicable or unspecified: Secondary | ICD-10-CM | POA: Insufficient documentation

## 2014-06-12 DIAGNOSIS — Q858 Other phakomatoses, not elsewhere classified: Secondary | ICD-10-CM

## 2014-06-12 DIAGNOSIS — Z3689 Encounter for other specified antenatal screening: Secondary | ICD-10-CM | POA: Insufficient documentation

## 2014-06-12 DIAGNOSIS — E669 Obesity, unspecified: Secondary | ICD-10-CM

## 2014-06-12 DIAGNOSIS — O99891 Other specified diseases and conditions complicating pregnancy: Secondary | ICD-10-CM | POA: Insufficient documentation

## 2014-06-12 DIAGNOSIS — O09529 Supervision of elderly multigravida, unspecified trimester: Secondary | ICD-10-CM | POA: Insufficient documentation

## 2014-06-12 DIAGNOSIS — G8389 Other specified paralytic syndromes: Secondary | ICD-10-CM | POA: Insufficient documentation

## 2014-06-12 DIAGNOSIS — IMO0002 Reserved for concepts with insufficient information to code with codable children: Secondary | ICD-10-CM

## 2014-06-12 DIAGNOSIS — O9989 Other specified diseases and conditions complicating pregnancy, childbirth and the puerperium: Principal | ICD-10-CM

## 2014-06-12 DIAGNOSIS — Q8589 Other phakomatoses, not elsewhere classified: Secondary | ICD-10-CM

## 2014-06-12 NOTE — Consult Note (Signed)
MFM consult   38 yr old G2P0010 at 467w5d with Sturge Weber syndrome referred by Wynelle BourgeoisMarie Williams for follow up ultrasound to complete anatomy and consult.  Ultrasound today shows: Single intrauterine pregnancy. Estimated fetal weight is in the 57th%. Anterior placenta without evidence of previa. Normal amniotic fluid volume. Normal transabdominal cervical length. The views of the profile and aortic/ductal arches are limited. The remainder of the limited anatomy survey is normal.  I counseled the patient as follows: 1. Appropriate fetal growth. 2. Limited anatomy survey: - discussed limitations of ultrasound in detecting fetal anomalies - will reattempt to complete anatomy on follow up ultrasound 3. Sturge-Weber syndrome: - previously counseled - MRI showed hypoplastic right frontal sinus - EEG showed background asymmetry over the right hemisphere- focal cerebral dysfunction - recommend routine OB care - recommend anesthesia consult - may have routine delivery according to obstetric indications; no contraindication to vaginal delivery  - recommend fetal growth in 4-6 weeks - followed by Neurology 4. Advanced maternal age: - previously counseled - had normal cell free fetal DNA  I spent a total of 30 minutes with the patient of which >50% was in face to face consultation.   Eulis FosterKristen Araf Clugston, MD

## 2014-06-12 NOTE — Progress Notes (Signed)
[facility] ultrasound  Indication: 38 yr old G2P0010 at 936w5d with Sturge Weber syndrome for follow up ultrasound to complete anatomy.  Findings: 1. Single intrauterine pregnancy. 2. Estimated fetal weight is in the 57th%. 3. Anterior placenta without evidence of previa. 4. Normal amniotic fluid volume. 5. Normal transabdominal cervical length. 6. The views of the profile and aortic/ductal arches are limited. 7. The remainder of the limited anatomy survey is normal.  Recommendations: 1. Appropriate fetal growth. 2. Limited anatomy survey: - discussed limitations of ultrasound in detecting fetal anomalies - will reattempt to complete anatomy on follow up ultrasound 3. Sturge-Weber syndrome: - previously counseled - see EPIC consult note - recommend routine OB care - recommend anesthesia consult - may have routine delivery according to obstetric indications - recommend fetal growth in 4-6 weeks - followed by Neurology  Misty FosterKristen Ravenne Wayment, MD

## 2014-06-14 ENCOUNTER — Encounter: Payer: BC Managed Care – PPO | Admitting: Family

## 2014-07-12 ENCOUNTER — Ambulatory Visit (INDEPENDENT_AMBULATORY_CARE_PROVIDER_SITE_OTHER): Payer: BC Managed Care – PPO | Admitting: Family Medicine

## 2014-07-12 VITALS — BP 96/61 | HR 89 | Temp 98.4°F | Wt 278.6 lb

## 2014-07-12 DIAGNOSIS — Q8589 Other phakomatoses, not elsewhere classified: Secondary | ICD-10-CM

## 2014-07-12 DIAGNOSIS — IMO0002 Reserved for concepts with insufficient information to code with codable children: Secondary | ICD-10-CM

## 2014-07-12 DIAGNOSIS — O09529 Supervision of elderly multigravida, unspecified trimester: Secondary | ICD-10-CM

## 2014-07-12 DIAGNOSIS — Q859 Phakomatosis, unspecified: Secondary | ICD-10-CM

## 2014-07-12 DIAGNOSIS — Q858 Other phakomatoses, not elsewhere classified: Secondary | ICD-10-CM

## 2014-07-12 LAB — POCT URINALYSIS DIP (DEVICE)
Bilirubin Urine: NEGATIVE
Glucose, UA: 250 mg/dL — AB
Hgb urine dipstick: NEGATIVE
Nitrite: NEGATIVE
Protein, ur: NEGATIVE mg/dL
Specific Gravity, Urine: 1.02 (ref 1.005–1.030)
Urobilinogen, UA: 0.2 mg/dL (ref 0.0–1.0)
pH: 6 (ref 5.0–8.0)

## 2014-07-12 NOTE — Progress Notes (Signed)
Tip of thumb is numb on left hand

## 2014-07-12 NOTE — Progress Notes (Signed)
Patient is 38 y.o. G2P0010 6862w0d.  +FM, denies LOF, VB, contractions, vaginal discharge.  Overall feeling well. - reports did have Tdap at work - had rophylac at last visit here - needs anesthesia consult - requested

## 2014-07-17 ENCOUNTER — Encounter (HOSPITAL_COMMUNITY): Payer: Self-pay

## 2014-07-17 ENCOUNTER — Ambulatory Visit (HOSPITAL_COMMUNITY)
Admission: RE | Admit: 2014-07-17 | Discharge: 2014-07-17 | Disposition: A | Discharge status: Home / Self Care | Payer: BC Managed Care – PPO | Source: Ambulatory Visit | Attending: Obstetrics & Gynecology | Admitting: Obstetrics & Gynecology

## 2014-07-17 VITALS — BP 128/67 | HR 105 | Wt 279.0 lb

## 2014-07-17 DIAGNOSIS — Q858 Other phakomatoses, not elsewhere classified: Secondary | ICD-10-CM

## 2014-07-17 DIAGNOSIS — Q8589 Other phakomatoses, not elsewhere classified: Secondary | ICD-10-CM

## 2014-07-17 DIAGNOSIS — O99891 Other specified diseases and conditions complicating pregnancy: Secondary | ICD-10-CM | POA: Diagnosis not present

## 2014-07-17 DIAGNOSIS — Z3689 Encounter for other specified antenatal screening: Secondary | ICD-10-CM | POA: Insufficient documentation

## 2014-07-17 DIAGNOSIS — IMO0002 Reserved for concepts with insufficient information to code with codable children: Secondary | ICD-10-CM

## 2014-07-17 DIAGNOSIS — E669 Obesity, unspecified: Secondary | ICD-10-CM

## 2014-07-17 DIAGNOSIS — O9989 Other specified diseases and conditions complicating pregnancy, childbirth and the puerperium: Secondary | ICD-10-CM

## 2014-07-17 DIAGNOSIS — Q859 Phakomatosis, unspecified: Secondary | ICD-10-CM | POA: Insufficient documentation

## 2014-07-17 DIAGNOSIS — O09529 Supervision of elderly multigravida, unspecified trimester: Secondary | ICD-10-CM | POA: Insufficient documentation

## 2014-07-17 DIAGNOSIS — O99210 Obesity complicating pregnancy, unspecified trimester: Secondary | ICD-10-CM

## 2014-07-19 ENCOUNTER — Telehealth: Payer: Self-pay | Admitting: General Practice

## 2014-07-19 NOTE — Telephone Encounter (Signed)
[facility] @ 8am. Called patient and informed her of MRI date, time, and location. Patient verbalized understanding and asked why they wanted an MRI. Told patient that anesthesia recommended the MRI to get a clear view of her spine to make sure there was not any problems present. Patient verbalized understanding and had no other questions

## 2014-07-19 NOTE — Telephone Encounter (Signed)
Message copied by Kathee DeltonHILLMAN, Delecia Vastine L on Thu Jul 19, 2014  4:22 PM ------      Message from: Fredirick LatheACOSTA, KRISTY      Created: Thu Jul 19, 2014  4:13 PM       Please call patient, notify her of MRI spine that has been ordered to evaluation her for spinal vascular malformations, anesthesia has requested this.             ------

## 2014-07-19 NOTE — Addendum Note (Signed)
Addended by: Fredirick LatheACOSTA, Shantay Sonn on: 07/19/2014 04:13 PM   Modules accepted: Orders

## 2014-07-31 ENCOUNTER — Ambulatory Visit (INDEPENDENT_AMBULATORY_CARE_PROVIDER_SITE_OTHER): Payer: BC Managed Care – PPO | Admitting: Obstetrics & Gynecology

## 2014-07-31 ENCOUNTER — Encounter: Payer: Self-pay | Admitting: Obstetrics & Gynecology

## 2014-07-31 VITALS — BP 137/71 | HR 85 | Temp 98.4°F | Wt 280.8 lb

## 2014-07-31 DIAGNOSIS — IMO0002 Reserved for concepts with insufficient information to code with codable children: Secondary | ICD-10-CM

## 2014-07-31 DIAGNOSIS — O09529 Supervision of elderly multigravida, unspecified trimester: Secondary | ICD-10-CM

## 2014-07-31 LAB — POCT URINALYSIS DIP (DEVICE)
Bilirubin Urine: NEGATIVE
Glucose, UA: NEGATIVE mg/dL
Ketones, ur: NEGATIVE mg/dL
Nitrite: NEGATIVE
Protein, ur: NEGATIVE mg/dL
Specific Gravity, Urine: 1.02 (ref 1.005–1.030)
Urobilinogen, UA: 0.2 mg/dL (ref 0.0–1.0)
pH: 7 (ref 5.0–8.0)

## 2014-07-31 LAB — OB RESULTS CONSOLE GBS: GBS: NEGATIVE

## 2014-07-31 LAB — OB RESULTS CONSOLE GC/CHLAMYDIA
Chlamydia: NEGATIVE
Gonorrhea: NEGATIVE

## 2014-07-31 NOTE — Progress Notes (Signed)
Routine visit. Good FM. No problems. U/S 07-17-14 showed 70% for growth. We discussed excess weight gain and increased risk of stillbirth. Encouraged no more weight gain. Cervical cultures today.

## 2014-08-01 LAB — GC/CHLAMYDIA PROBE AMP
CT Probe RNA: NEGATIVE
GC Probe RNA: NEGATIVE

## 2014-08-02 LAB — CULTURE, BETA STREP (GROUP B ONLY)

## 2014-08-07 ENCOUNTER — Ambulatory Visit (HOSPITAL_COMMUNITY)
Admission: RE | Admit: 2014-08-07 | Discharge: 2014-08-07 | Disposition: A | Discharge status: Home / Self Care | Payer: BC Managed Care – PPO | Source: Ambulatory Visit | Attending: Family Medicine | Admitting: Family Medicine

## 2014-08-07 ENCOUNTER — Encounter: Payer: Self-pay | Admitting: Anesthesiology

## 2014-08-07 ENCOUNTER — Ambulatory Visit (HOSPITAL_COMMUNITY): Admission: RE | Admit: 2014-08-07 | Payer: BC Managed Care – PPO | Source: Ambulatory Visit

## 2014-08-07 ENCOUNTER — Other Ambulatory Visit: Payer: Self-pay | Admitting: Family Medicine

## 2014-08-07 ENCOUNTER — Ambulatory Visit (HOSPITAL_COMMUNITY): Payer: BC Managed Care – PPO

## 2014-08-07 ENCOUNTER — Other Ambulatory Visit (HOSPITAL_COMMUNITY): Payer: Self-pay | Admitting: Family Medicine

## 2014-08-07 DIAGNOSIS — O99891 Other specified diseases and conditions complicating pregnancy: Secondary | ICD-10-CM | POA: Insufficient documentation

## 2014-08-07 DIAGNOSIS — Q858 Other phakomatoses, not elsewhere classified: Secondary | ICD-10-CM

## 2014-08-07 DIAGNOSIS — Q8589 Other phakomatoses, not elsewhere classified: Secondary | ICD-10-CM

## 2014-08-07 DIAGNOSIS — Q859 Phakomatosis, unspecified: Secondary | ICD-10-CM | POA: Insufficient documentation

## 2014-08-07 DIAGNOSIS — O9989 Other specified diseases and conditions complicating pregnancy, childbirth and the puerperium: Principal | ICD-10-CM

## 2014-08-07 NOTE — Anesthesia Preprocedure Evaluation (Deleted)
Anesthesia Evaluation Anesthesia Physical Anesthesia Plan Anesthesia Quick Evaluation Was called by radiology to ask about orders for MRI.  Brain MRI has been completed and shows no stigmata from [employer].  Recommend MRI lumbar and thoracic spine to rule out AVMs for risk with epidural placement/spinal placement

## 2014-08-09 ENCOUNTER — Ambulatory Visit (INDEPENDENT_AMBULATORY_CARE_PROVIDER_SITE_OTHER): Payer: BC Managed Care – PPO | Admitting: Family Medicine

## 2014-08-09 VITALS — BP 122/69 | HR 102 | Temp 97.7°F | Wt 283.6 lb

## 2014-08-09 DIAGNOSIS — O09523 Supervision of elderly multigravida, third trimester: Secondary | ICD-10-CM

## 2014-08-09 DIAGNOSIS — O09529 Supervision of elderly multigravida, unspecified trimester: Secondary | ICD-10-CM

## 2014-08-09 LAB — POCT URINALYSIS DIP (DEVICE)
Bilirubin Urine: NEGATIVE
Glucose, UA: 500 mg/dL — AB
Hgb urine dipstick: NEGATIVE
Ketones, ur: 15 mg/dL — AB
Nitrite: NEGATIVE
Protein, ur: NEGATIVE mg/dL
Specific Gravity, Urine: <=1.005 (ref 1.005–1.030)
Urobilinogen, UA: 0.2 mg/dL (ref 0.0–1.0)
pH: 6 (ref 5.0–8.0)

## 2014-08-09 NOTE — Progress Notes (Signed)
Doing well.  Having some braxton hicks.  No bleeding, discharge, leaking fluid.  GBS negative.  F/u in 1 week.

## 2014-08-09 NOTE — Progress Notes (Signed)
Reports edema in feet.  Reports intermittent contractions.  Patient works at [employer] and reports getting Tdap(1 month ago) and flu vaccine(last week) there.

## 2014-08-09 NOTE — Patient Instructions (Signed)
Third Trimester of Pregnancy The third trimester is from week 29 through week 42, months 7 through 9. The third trimester is a time when the fetus is growing rapidly. At the end of the ninth month, the fetus is about 20 inches in length and weighs 6-10 pounds.  BODY CHANGES Your body goes through many changes during pregnancy. The changes vary from woman to woman.   Your weight will continue to increase. You can expect to gain 25-35 pounds (11-16 kg) by the end of the pregnancy.  You may begin to get stretch marks on your hips, abdomen, and breasts.  You may urinate more often because the fetus is moving lower into your pelvis and pressing on your bladder.  You may develop or continue to have heartburn as a result of your pregnancy.  You may develop constipation because certain hormones are causing the muscles that push waste through your intestines to slow down.  You may develop hemorrhoids or swollen, bulging veins (varicose veins).  You may have pelvic pain because of the weight gain and pregnancy hormones relaxing your joints between the bones in your pelvis. Backaches may result from overexertion of the muscles supporting your posture.  You may have changes in your hair. These can include thickening of your hair, rapid growth, and changes in texture. Some women also have hair loss during or after pregnancy, or hair that feels dry or thin. Your hair will most likely return to normal after your baby is born.  Your breasts will continue to grow and be tender. A yellow discharge may leak from your breasts called colostrum.  Your belly button may stick out.  You may feel short of breath because of your expanding uterus.  You may notice the fetus "dropping," or moving lower in your abdomen.  You may have a bloody mucus discharge. This usually occurs a few days to a week before labor begins.  Your cervix becomes thin and soft (effaced) near your due date. WHAT TO EXPECT AT YOUR PRENATAL  EXAMS  You will have prenatal exams every 2 weeks until week 36. Then, you will have weekly prenatal exams. During a routine prenatal visit:  You will be weighed to make sure you and the fetus are growing normally.  Your blood pressure is taken.  Your abdomen will be measured to track your baby's growth.  The fetal heartbeat will be listened to.  Any test results from the previous visit will be discussed.  You may have a cervical check near your due date to see if you have effaced. At around 36 weeks, your caregiver will check your cervix. At the same time, your caregiver will also perform a test on the secretions of the vaginal tissue. This test is to determine if a type of bacteria, Group B streptococcus, is present. Your caregiver will explain this further. Your caregiver may ask you:  What your birth plan is.  How you are feeling.  If you are feeling the baby move.  If you have had any abnormal symptoms, such as leaking fluid, bleeding, severe headaches, or abdominal cramping.  If you have any questions. Other tests or screenings that may be performed during your third trimester include:  Blood tests that check for low iron levels (anemia).  Fetal testing to check the health, activity level, and growth of the fetus. Testing is done if you have certain medical conditions or if there are problems during the pregnancy. FALSE LABOR You may feel small, irregular contractions that   eventually go away. These are called Braxton Hicks contractions, or false labor. Contractions may last for hours, days, or even weeks before true labor sets in. If contractions come at regular intervals, intensify, or become painful, it is best to be seen by your caregiver.  SIGNS OF LABOR   Menstrual-like cramps.  Contractions that are 5 minutes apart or less.  Contractions that start on the top of the uterus and spread down to the lower abdomen and back.  A sense of increased pelvic pressure or back  pain.  A watery or bloody mucus discharge that comes from the vagina. If you have any of these signs before the 37th week of pregnancy, call your caregiver right away. You need to go to the hospital to get checked immediately. HOME CARE INSTRUCTIONS   Avoid all smoking, herbs, alcohol, and unprescribed drugs. These chemicals affect the formation and growth of the baby.  Follow your caregiver's instructions regarding medicine use. There are medicines that are either safe or unsafe to take during pregnancy.  Exercise only as directed by your caregiver. Experiencing uterine cramps is a good sign to stop exercising.  Continue to eat regular, healthy meals.  Wear a good support bra for breast tenderness.  Do not use hot tubs, steam rooms, or saunas.  Wear your seat belt at all times when driving.  Avoid raw meat, uncooked cheese, cat litter boxes, and soil used by cats. These carry germs that can cause birth defects in the baby.  Take your prenatal vitamins.  Try taking a stool softener (if your caregiver approves) if you develop constipation. Eat more high-fiber foods, such as fresh vegetables or fruit and whole grains. Drink plenty of fluids to keep your urine clear or pale yellow.  Take warm sitz baths to soothe any pain or discomfort caused by hemorrhoids. Use hemorrhoid cream if your caregiver approves.  If you develop varicose veins, wear support hose. Elevate your feet for 15 minutes, 3-4 times a day. Limit salt in your diet.  Avoid heavy lifting, wear low heal shoes, and practice good posture.  Rest a lot with your legs elevated if you have leg cramps or low back pain.  Visit your dentist if you have not gone during your pregnancy. Use a soft toothbrush to brush your teeth and be gentle when you floss.  A sexual relationship may be continued unless your caregiver directs you otherwise.  Do not travel far distances unless it is absolutely necessary and only with the approval  of your caregiver.  Take prenatal classes to understand, practice, and ask questions about the labor and delivery.  Make a trial run to the hospital.  Pack your hospital bag.  Prepare the baby's nursery.  Continue to go to all your prenatal visits as directed by your caregiver. SEEK MEDICAL CARE IF:  You are unsure if you are in labor or if your water has broken.  You have dizziness.  You have mild pelvic cramps, pelvic pressure, or nagging pain in your abdominal area.  You have persistent nausea, vomiting, or diarrhea.  You have a bad smelling vaginal discharge.  You have pain with urination. SEEK IMMEDIATE MEDICAL CARE IF:   You have a fever.  You are leaking fluid from your vagina.  You have spotting or bleeding from your vagina.  You have severe abdominal cramping or pain.  You have rapid weight loss or gain.  You have shortness of breath with chest pain.  You notice sudden or extreme swelling   of your face, hands, ankles, feet, or legs.  You have not felt your baby move in over an hour.  You have severe headaches that do not go away with medicine.  You have vision changes. Document Released: 11/10/2001 Document Revised: 11/21/2013 Document Reviewed: 01/17/2013 ExitCare Patient Information 2015 [employer]. This information is not intended to replace advice given to you by your health care provider. Make sure you discuss any questions you have with your health care provider.  

## 2014-08-11 ENCOUNTER — Encounter (HOSPITAL_COMMUNITY): Payer: Self-pay | Admitting: *Deleted

## 2014-08-11 ENCOUNTER — Inpatient Hospital Stay (HOSPITAL_COMMUNITY)
Admission: AD | Admit: 2014-08-11 | Discharge: 2014-08-11 | Disposition: A | Discharge status: Home / Self Care | Payer: BC Managed Care – PPO | Source: Ambulatory Visit | Attending: Obstetrics & Gynecology | Admitting: Obstetrics & Gynecology

## 2014-08-11 DIAGNOSIS — O479 False labor, unspecified: Secondary | ICD-10-CM | POA: Insufficient documentation

## 2014-08-11 DIAGNOSIS — O09529 Supervision of elderly multigravida, unspecified trimester: Secondary | ICD-10-CM | POA: Diagnosis not present

## 2014-08-11 DIAGNOSIS — Q859 Phakomatosis, unspecified: Secondary | ICD-10-CM | POA: Insufficient documentation

## 2014-08-11 DIAGNOSIS — O471 False labor at or after 37 completed weeks of gestation: Secondary | ICD-10-CM

## 2014-08-11 LAB — URINALYSIS, ROUTINE W REFLEX MICROSCOPIC
Bilirubin Urine: NEGATIVE
Glucose, UA: NEGATIVE mg/dL
KETONES UR: 15 mg/dL — AB
Nitrite: NEGATIVE
PROTEIN: NEGATIVE mg/dL
Specific Gravity, Urine: 1.01 (ref 1.005–1.030)
UROBILINOGEN UA: 0.2 mg/dL (ref 0.0–1.0)
pH: 6 (ref 5.0–8.0)

## 2014-08-11 LAB — URINE MICROSCOPIC-ADD ON

## 2014-08-11 NOTE — MAU Provider Note (Signed)
Chief Complaint:  Labor Eval        HPI: Misty Little is a 38 y.o. G2P0010 at [redacted]w[redacted]d who presents to maternity admissions reporting UCs increasingly uncomfortable since 1400 today. Mild LBP. No dysuria. Here in [place] so wanted cx check.  Denies leakage of fluid or vaginal bleeding. Good fetal movement.   Pregnancy Course: LRC: AMA, Sturge-Weber Syndrome  Past Medical History: Past Medical History  Diagnosis Date  . Sturge-Weber syndrome with glaucoma   . Infection     uti  . Seizures     "small ones as child"  . Anemia   . Headache(784.0)     Past obstetric history: OB History  Gravida Para Term Preterm AB SAB TAB Ectopic Multiple Living     # Outcome Date GA Lbr Len/2nd Weight Sex Delivery Anes PTL Lv  2 CUR           1 SAB 08/2013 [redacted]w[redacted]d            Comments: no complications      Past Surgical History: Past Surgical History  Procedure Laterality Date  . Eye surgery  1977  . Toe surgery  1977     Family History: Family History  Problem Relation Age of Onset  . Heart disease Mother   . Diabetes Maternal Aunt   . Diabetes Maternal Grandmother   . Hearing loss Neg Hx     Social History: History  Substance Use Topics  . Smoking status: Never Smoker   . Smokeless tobacco: Never Used  . Alcohol Use: No     Comment: occasional before pregnancy    Allergies:  Allergies  Allergen Reactions  . Other Other (See Comments)    Pt states that she is allergic to walnuts- Reaction:  Causes tongue to hurt.     Meds:  Prescriptions prior to admission  Medication Sig Dispense Refill  . calcium carbonate (TUMS - DOSED IN MG ELEMENTAL CALCIUM) 500 MG chewable tablet Chew 2 tablets by mouth 4 (four) times daily as needed for heartburn.       . diphenhydrAMINE (BENADRYL) 25 MG tablet Take 25 mg by mouth at bedtime as needed for allergies or sleep.       . Prenatal Vit-Fe Fumarate-FA (PRENATAL VITAMINS PLUS PO) Take 1 tablet by mouth daily.      .  Promethazine HCl (PHENERGAN PO) Take by mouth.        ROS: Pertinent findings in history of present illness.  Physical Exam   Filed Vitals:   08/11/14 1705  BP: 130/67  Pulse: 75  Temp: 97.8 F (36.6 C)  TempSrc: Oral  Resp: 18  SpO2: 100%   GENERAL: Obese female in no acute distress.  HEENT: normocephalic HEART: normal rate RESP: normal effort ABDOMEN: Soft, non-tender, gravid appropriate for gestational age BACK: neg CVAT EXTREMITIES: Nontender, no edema NEURO: alert and oriented  Dilation: Fingertip Effacement (%): Thick Cervical Position: Middle Station: Ballotable Exam by:: Exie Parody, RN  FHT:  Baseline 130 , moderate variability, accelerations present, no decelerations Contractions: rare mild   Labs: Results for orders placed during the hospital encounter of 08/11/14 (from the past 24 hour(s))  URINALYSIS, ROUTINE W REFLEX MICROSCOPIC     Status: Abnormal   Collection Time    08/11/14  4:08 PM      Result Value Ref Range   Color, Urine YELLOW  YELLOW   APPearance CLEAR  CLEAR  Specific Gravity, Urine 1.010  1.005 - 1.030   pH 6.0  5.0 - 8.0   Glucose, UA NEGATIVE  NEGATIVE mg/dL   Hgb urine dipstick SMALL (*) NEGATIVE   Bilirubin Urine NEGATIVE  NEGATIVE   Ketones, ur 15 (*) NEGATIVE mg/dL   Protein, ur NEGATIVE  NEGATIVE mg/dL   Urobilinogen, UA 0.2  0.0 - 1.0 mg/dL   Nitrite NEGATIVE  NEGATIVE   Leukocytes, UA TRACE (*) NEGATIVE  URINE MICROSCOPIC-ADD ON     Status: None   Collection Time    08/11/14  4:08 PM      Result Value Ref Range   Squamous Epithelial / LPF RARE  RARE   WBC, UA 0-2  <3 WBC/hpf   RBC / HPF 0-2  <3 RBC/hpf   Bacteria, UA RARE  RARE     MAU Course:   Assessment: 1. False labor after 37 completed weeks of gestation   G2P0010 at [redacted]w[redacted]d Category 1 FHR  Plan: Discharge home with reassurance. Increase fluids. Labor precautions and fetal kick counts    Medication List         calcium carbonate 500 MG chewable  tablet  Commonly known as:  TUMS - dosed in mg elemental calcium  Chew 2 tablets by mouth 4 (four) times daily as needed for heartburn.     diphenhydrAMINE 25 MG tablet  Commonly known as:  BENADRYL  Take 25 mg by mouth at bedtime as needed for allergies or sleep.     PRENATAL VITAMINS PLUS PO  Take 1 tablet by mouth daily.     promethazine 25 MG tablet  Commonly known as:  PHENERGAN  Take 12.5 mg by mouth every 6 (six) hours as needed for nausea or vomiting.       Follow-up Information   Follow up with [facility] On 08/16/2014.      Danae Orleans, CNM 08/11/2014 4:49 PM

## 2014-08-11 NOTE — Discharge Instructions (Signed)
Braxton Hicks Contractions °Contractions of the uterus can occur throughout pregnancy. Contractions are not always a sign that you are in labor.  °WHAT ARE BRAXTON HICKS CONTRACTIONS?  °Contractions that occur before labor are called Braxton Hicks contractions, or false labor. Toward the end of pregnancy (32-34 weeks), these contractions can develop more often and may become more forceful. This is not true labor because these contractions do not result in opening (dilatation) and thinning of the cervix. They are sometimes difficult to tell apart from true labor because these contractions can be forceful and people have different pain tolerances. You should not feel embarrassed if you go to the hospital with false labor. Sometimes, the only way to tell if you are in true labor is for your health care provider to look for changes in the cervix. °If there are no prenatal problems or other health problems associated with the pregnancy, it is completely safe to be sent home with false labor and await the onset of true labor. °HOW CAN YOU TELL THE DIFFERENCE BETWEEN TRUE AND FALSE LABOR? °False Labor °· The contractions of false labor are usually shorter and not as hard as those of true labor.   °· The contractions are usually irregular.   °· The contractions are often felt in the front of the lower abdomen and in the groin.   °· The contractions may go away when you walk around or change positions while lying down.   °· The contractions get weaker and are shorter lasting as time goes on.   °· The contractions do not usually become progressively stronger, regular, and closer together as with true labor.   °True Labor °· Contractions in true labor last 30-70 seconds, become very regular, usually become more intense, and increase in frequency.   °· The contractions do not go away with walking.   °· The discomfort is usually felt in the top of the uterus and spreads to the lower abdomen and low back.   °· True labor can be  determined by your health care provider with an exam. This will show that the cervix is dilating and getting thinner.   °WHAT TO REMEMBER °· Keep up with your usual exercises and follow other instructions given by your health care provider.   °· Take medicines as directed by your health care provider.   °· Keep your regular prenatal appointments.   °· Eat and drink lightly if you think you are going into labor.   °· If Braxton Hicks contractions are making you uncomfortable:   °¨ Change your position from lying down or resting to walking, or from walking to resting.   °¨ Sit and rest in a tub of warm water.   °¨ Drink 2-3 glasses of water. Dehydration may cause these contractions.   °¨ Do slow and deep breathing several times an hour.   °WHEN SHOULD I SEEK IMMEDIATE MEDICAL CARE? °Seek immediate medical care if: °· Your contractions become stronger, more regular, and closer together.   °· You have fluid leaking or gushing from your vagina.   °· You have a fever.   °· You pass blood-tinged mucus.   °· You have vaginal bleeding.   °· You have continuous abdominal pain.   °· You have low back pain that you never had before.   °· You feel your baby's head pushing down and causing pelvic pressure.   °· Your baby is not moving as much as it used to.   °Document Released: 11/16/2005 Document Revised: 11/21/2013 Document Reviewed: 08/28/2013 °ExitCare® Patient Information ©2015 [employer]. This information is not intended to replace advice given to you by your health care   provider. Make sure you discuss any questions you have with your health care provider. ° °

## 2014-08-11 NOTE — MAU Note (Signed)
Contractions started around 1400 and have gotten worse since then, no bleeding/LOF.

## 2014-08-11 NOTE — MAU Provider Note (Signed)
Attestation of Attending Supervision of Advanced Practitioner (PA/CNM/NP): Evaluation and management procedures were performed by the Advanced Practitioner under my supervision and collaboration.  I have reviewed the Advanced Practitioner's note and chart, and I agree with the management and plan.  Isac Lincks, MD, FACOG Attending Obstetrician & Gynecologist Faculty Practice, [facility] -    

## 2014-08-13 ENCOUNTER — Inpatient Hospital Stay (HOSPITAL_COMMUNITY): Admission: RE | Admit: 2014-08-13 | Payer: BC Managed Care – PPO | Source: Ambulatory Visit

## 2014-08-16 ENCOUNTER — Ambulatory Visit (INDEPENDENT_AMBULATORY_CARE_PROVIDER_SITE_OTHER): Payer: BC Managed Care – PPO | Admitting: Family Medicine

## 2014-08-16 VITALS — BP 132/77 | HR 96 | Temp 98.3°F | Wt 290.0 lb

## 2014-08-16 DIAGNOSIS — Z3480 Encounter for supervision of other normal pregnancy, unspecified trimester: Secondary | ICD-10-CM

## 2014-08-16 DIAGNOSIS — Z23 Encounter for immunization: Secondary | ICD-10-CM

## 2014-08-16 LAB — POCT URINALYSIS DIP (DEVICE)
Bilirubin Urine: NEGATIVE
Glucose, UA: 500 mg/dL — AB
Nitrite: NEGATIVE
Protein, ur: NEGATIVE mg/dL
SPECIFIC GRAVITY, URINE: 1.025 (ref 1.005–1.030)
Urobilinogen, UA: 0.2 mg/dL (ref 0.0–1.0)
pH: 6 (ref 5.0–8.0)

## 2014-08-16 NOTE — Patient Instructions (Signed)
Third Trimester of Pregnancy The third trimester is from week 29 through week 42, months 7 through 9. The third trimester is a time when the fetus is growing rapidly. At the end of the ninth month, the fetus is about 20 inches in length and weighs 6-10 pounds.  BODY CHANGES Your body goes through many changes during pregnancy. The changes vary from woman to woman.   Your weight will continue to increase. You can expect to gain 25-35 pounds (11-16 kg) by the end of the pregnancy.  You may begin to get stretch marks on your hips, abdomen, and breasts.  You may urinate more often because the fetus is moving lower into your pelvis and pressing on your bladder.  You may develop or continue to have heartburn as a result of your pregnancy.  You may develop constipation because certain hormones are causing the muscles that push waste through your intestines to slow down.  You may develop hemorrhoids or swollen, bulging veins (varicose veins).  You may have pelvic pain because of the weight gain and pregnancy hormones relaxing your joints between the bones in your pelvis. Backaches may result from overexertion of the muscles supporting your posture.  You may have changes in your hair. These can include thickening of your hair, rapid growth, and changes in texture. Some women also have hair loss during or after pregnancy, or hair that feels dry or thin. Your hair will most likely return to normal after your baby is born.  Your breasts will continue to grow and be tender. A yellow discharge may leak from your breasts called colostrum.  Your belly button may stick out.  You may feel short of breath because of your expanding uterus.  You may notice the fetus "dropping," or moving lower in your abdomen.  You may have a bloody mucus discharge. This usually occurs a few days to a week before labor begins.  Your cervix becomes thin and soft (effaced) near your due date. WHAT TO EXPECT AT YOUR PRENATAL  EXAMS  You will have prenatal exams every 2 weeks until week 36. Then, you will have weekly prenatal exams. During a routine prenatal visit:  You will be weighed to make sure you and the fetus are growing normally.  Your blood pressure is taken.  Your abdomen will be measured to track your baby's growth.  The fetal heartbeat will be listened to.  Any test results from the previous visit will be discussed.  You may have a cervical check near your due date to see if you have effaced. At around 36 weeks, your caregiver will check your cervix. At the same time, your caregiver will also perform a test on the secretions of the vaginal tissue. This test is to determine if a type of bacteria, Group B streptococcus, is present. Your caregiver will explain this further. Your caregiver may ask you:  What your birth plan is.  How you are feeling.  If you are feeling the baby move.  If you have had any abnormal symptoms, such as leaking fluid, bleeding, severe headaches, or abdominal cramping.  If you have any questions. Other tests or screenings that may be performed during your third trimester include:  Blood tests that check for low iron levels (anemia).  Fetal testing to check the health, activity level, and growth of the fetus. Testing is done if you have certain medical conditions or if there are problems during the pregnancy. FALSE LABOR You may feel small, irregular contractions that   eventually go away. These are called Braxton Hicks contractions, or false labor. Contractions may last for hours, days, or even weeks before true labor sets in. If contractions come at regular intervals, intensify, or become painful, it is best to be seen by your caregiver.  SIGNS OF LABOR   Menstrual-like cramps.  Contractions that are 5 minutes apart or less.  Contractions that start on the top of the uterus and spread down to the lower abdomen and back.  A sense of increased pelvic pressure or back  pain.  A watery or bloody mucus discharge that comes from the vagina. If you have any of these signs before the 37th week of pregnancy, call your caregiver right away. You need to go to the hospital to get checked immediately. HOME CARE INSTRUCTIONS   Avoid all smoking, herbs, alcohol, and unprescribed drugs. These chemicals affect the formation and growth of the baby.  Follow your caregiver's instructions regarding medicine use. There are medicines that are either safe or unsafe to take during pregnancy.  Exercise only as directed by your caregiver. Experiencing uterine cramps is a good sign to stop exercising.  Continue to eat regular, healthy meals.  Wear a good support bra for breast tenderness.  Do not use hot tubs, steam rooms, or saunas.  Wear your seat belt at all times when driving.  Avoid raw meat, uncooked cheese, cat litter boxes, and soil used by cats. These carry germs that can cause birth defects in the baby.  Take your prenatal vitamins.  Try taking a stool softener (if your caregiver approves) if you develop constipation. Eat more high-fiber foods, such as fresh vegetables or fruit and whole grains. Drink plenty of fluids to keep your urine clear or pale yellow.  Take warm sitz baths to soothe any pain or discomfort caused by hemorrhoids. Use hemorrhoid cream if your caregiver approves.  If you develop varicose veins, wear support hose. Elevate your feet for 15 minutes, 3-4 times a day. Limit salt in your diet.  Avoid heavy lifting, wear low heal shoes, and practice good posture.  Rest a lot with your legs elevated if you have leg cramps or low back pain.  Visit your dentist if you have not gone during your pregnancy. Use a soft toothbrush to brush your teeth and be gentle when you floss.  A sexual relationship may be continued unless your caregiver directs you otherwise.  Do not travel far distances unless it is absolutely necessary and only with the approval  of your caregiver.  Take prenatal classes to understand, practice, and ask questions about the labor and delivery.  Make a trial run to the hospital.  Pack your hospital bag.  Prepare the baby's nursery.  Continue to go to all your prenatal visits as directed by your caregiver. SEEK MEDICAL CARE IF:  You are unsure if you are in labor or if your water has broken.  You have dizziness.  You have mild pelvic cramps, pelvic pressure, or nagging pain in your abdominal area.  You have persistent nausea, vomiting, or diarrhea.  You have a bad smelling vaginal discharge.  You have pain with urination. SEEK IMMEDIATE MEDICAL CARE IF:   You have a fever.  You are leaking fluid from your vagina.  You have spotting or bleeding from your vagina.  You have severe abdominal cramping or pain.  You have rapid weight loss or gain.  You have shortness of breath with chest pain.  You notice sudden or extreme swelling   of your face, hands, ankles, feet, or legs.  You have not felt your baby move in over an hour.  You have severe headaches that do not go away with medicine.  You have vision changes. Document Released: 11/10/2001 Document Revised: 11/21/2013 Document Reviewed: 01/17/2013 ExitCare Patient Information 2015 [employer]. This information is not intended to replace advice given to you by your health care provider. Make sure you discuss any questions you have with your health care provider.  

## 2014-08-16 NOTE — Progress Notes (Signed)
Patient without complaints.  Having some braxton hicks contractions.  Denies vaginal bleeding, abnormal vaginal discharge, loss of fluid.  Reports good fetal activity.  Labor precautions reviewed.  Follow up in 1 weeks.

## 2014-08-23 ENCOUNTER — Ambulatory Visit (INDEPENDENT_AMBULATORY_CARE_PROVIDER_SITE_OTHER): Payer: BC Managed Care – PPO | Admitting: Obstetrics and Gynecology

## 2014-08-23 ENCOUNTER — Encounter: Payer: Self-pay | Admitting: Obstetrics and Gynecology

## 2014-08-23 VITALS — BP 123/82 | HR 89 | Wt 288.5 lb

## 2014-08-23 DIAGNOSIS — O3090 Multiple gestation, unspecified, unspecified trimester: Secondary | ICD-10-CM

## 2014-08-23 DIAGNOSIS — Q858 Other phakomatoses, not elsewhere classified: Secondary | ICD-10-CM

## 2014-08-23 DIAGNOSIS — E669 Obesity, unspecified: Secondary | ICD-10-CM

## 2014-08-23 DIAGNOSIS — Q859 Phakomatosis, unspecified: Secondary | ICD-10-CM

## 2014-08-23 DIAGNOSIS — O360131 Maternal care for anti-D [Rh] antibodies, third trimester, fetus 1: Secondary | ICD-10-CM

## 2014-08-23 DIAGNOSIS — Q8589 Other phakomatoses, not elsewhere classified: Secondary | ICD-10-CM

## 2014-08-23 DIAGNOSIS — O09523 Supervision of elderly multigravida, third trimester: Secondary | ICD-10-CM

## 2014-08-23 DIAGNOSIS — O360990 Maternal care for other rhesus isoimmunization, unspecified trimester, not applicable or unspecified: Secondary | ICD-10-CM

## 2014-08-23 DIAGNOSIS — O09529 Supervision of elderly multigravida, unspecified trimester: Secondary | ICD-10-CM

## 2014-08-23 LAB — POCT URINALYSIS DIP (DEVICE)
Bilirubin Urine: NEGATIVE
Glucose, UA: NEGATIVE mg/dL
Ketones, ur: NEGATIVE mg/dL
Nitrite: NEGATIVE
Protein, ur: NEGATIVE mg/dL
Specific Gravity, Urine: 1.02 (ref 1.005–1.030)
Urobilinogen, UA: 0.2 mg/dL (ref 0.0–1.0)
pH: 7 (ref 5.0–8.0)

## 2014-08-23 NOTE — Progress Notes (Signed)
Patient is doing well without complaints. Fetal movement, labor precautions reviewed.

## 2014-08-23 NOTE — Progress Notes (Signed)
Notices some swelling in her feet. Was much worse earlier in the week but now is getting better.

## 2014-08-24 ENCOUNTER — Telehealth: Payer: Self-pay | Admitting: *Deleted

## 2014-08-24 NOTE — Telephone Encounter (Signed)
Received message from [employer] requesting information about pt- delivery, etc. A call back was requested and reference claim # [id number].  I returned the call and informed the representative that this pt has not delivered her baby yet. I gave due date information. I was asked if this pt has been taken out of work for medical reason because she has filed a claim with [employer]. I stated that I did not see any notes pertaining to her being taken out of work for any indication and also we have not completed FMLA or disability forms for her. I was informed that they will contact us again if additional information is needed.

## 2014-08-30 ENCOUNTER — Encounter (HOSPITAL_COMMUNITY): Payer: Self-pay | Admitting: Anesthesiology

## 2014-08-30 ENCOUNTER — Other Ambulatory Visit: Payer: Self-pay | Admitting: Obstetrics & Gynecology

## 2014-08-30 ENCOUNTER — Other Ambulatory Visit (HOSPITAL_COMMUNITY): Payer: BC Managed Care – PPO

## 2014-08-30 ENCOUNTER — Ambulatory Visit (INDEPENDENT_AMBULATORY_CARE_PROVIDER_SITE_OTHER): Payer: BC Managed Care – PPO | Admitting: Obstetrics & Gynecology

## 2014-08-30 ENCOUNTER — Inpatient Hospital Stay (HOSPITAL_COMMUNITY)
Admission: AD | Admit: 2014-08-30 | Discharge: 2014-09-05 | DRG: 765 | Disposition: A | Discharge status: Home / Self Care | Payer: BC Managed Care – PPO | Source: Ambulatory Visit | Attending: Family Medicine | Admitting: Family Medicine

## 2014-08-30 ENCOUNTER — Encounter (HOSPITAL_COMMUNITY): Payer: Self-pay | Admitting: *Deleted

## 2014-08-30 ENCOUNTER — Ambulatory Visit (HOSPITAL_COMMUNITY)
Admission: RE | Admit: 2014-08-30 | Discharge: 2014-08-30 | Disposition: A | Discharge status: Home / Self Care | Payer: BC Managed Care – PPO | Source: Ambulatory Visit | Attending: Obstetrics & Gynecology | Admitting: Obstetrics & Gynecology

## 2014-08-30 VITALS — BP 127/89 | HR 101 | Temp 98.1°F | Wt 294.1 lb

## 2014-08-30 DIAGNOSIS — Q858 Other phakomatoses, not elsewhere classified: Secondary | ICD-10-CM | POA: Diagnosis not present

## 2014-08-30 DIAGNOSIS — O09529 Supervision of elderly multigravida, unspecified trimester: Secondary | ICD-10-CM

## 2014-08-30 DIAGNOSIS — Z3A40 40 weeks gestation of pregnancy: Secondary | ICD-10-CM | POA: Diagnosis present

## 2014-08-30 DIAGNOSIS — K219 Gastro-esophageal reflux disease without esophagitis: Secondary | ICD-10-CM | POA: Diagnosis present

## 2014-08-30 DIAGNOSIS — O1403 Mild to moderate pre-eclampsia, third trimester: Secondary | ICD-10-CM | POA: Diagnosis not present

## 2014-08-30 DIAGNOSIS — Q8589 Other phakomatoses, not elsewhere classified: Secondary | ICD-10-CM

## 2014-08-30 DIAGNOSIS — Z6841 Body Mass Index (BMI) 40.0 and over, adult: Secondary | ICD-10-CM | POA: Diagnosis not present

## 2014-08-30 DIAGNOSIS — Z833 Family history of diabetes mellitus: Secondary | ICD-10-CM

## 2014-08-30 DIAGNOSIS — Z8249 Family history of ischemic heart disease and other diseases of the circulatory system: Secondary | ICD-10-CM | POA: Diagnosis not present

## 2014-08-30 DIAGNOSIS — O411230 Chorioamnionitis, third trimester, not applicable or unspecified: Secondary | ICD-10-CM | POA: Diagnosis present

## 2014-08-30 DIAGNOSIS — O99613 Diseases of the digestive system complicating pregnancy, third trimester: Secondary | ICD-10-CM | POA: Diagnosis present

## 2014-08-30 DIAGNOSIS — O360930 Maternal care for other rhesus isoimmunization, third trimester, not applicable or unspecified: Secondary | ICD-10-CM | POA: Diagnosis present

## 2014-08-30 DIAGNOSIS — O09523 Supervision of elderly multigravida, third trimester: Secondary | ICD-10-CM

## 2014-08-30 DIAGNOSIS — O26899 Other specified pregnancy related conditions, unspecified trimester: Secondary | ICD-10-CM | POA: Diagnosis present

## 2014-08-30 DIAGNOSIS — O99214 Obesity complicating childbirth: Secondary | ICD-10-CM | POA: Diagnosis present

## 2014-08-30 DIAGNOSIS — O99213 Obesity complicating pregnancy, third trimester: Secondary | ICD-10-CM

## 2014-08-30 DIAGNOSIS — O139 Gestational [pregnancy-induced] hypertension without significant proteinuria, unspecified trimester: Secondary | ICD-10-CM | POA: Diagnosis present

## 2014-08-30 DIAGNOSIS — O133 Gestational [pregnancy-induced] hypertension without significant proteinuria, third trimester: Secondary | ICD-10-CM

## 2014-08-30 DIAGNOSIS — Z6791 Unspecified blood type, Rh negative: Secondary | ICD-10-CM | POA: Diagnosis present

## 2014-08-30 LAB — POCT URINALYSIS DIP (DEVICE)
Bilirubin Urine: NEGATIVE
Glucose, UA: 100 mg/dL — AB
Nitrite: NEGATIVE
Protein, ur: NEGATIVE mg/dL
Specific Gravity, Urine: 1.015 (ref 1.005–1.030)
Urobilinogen, UA: 0.2 mg/dL (ref 0.0–1.0)
pH: 6.5 (ref 5.0–8.0)

## 2014-08-30 LAB — CBC
HEMATOCRIT: 37.8 % (ref 36.0–46.0)
Hemoglobin: 12.8 g/dL (ref 12.0–15.0)
MCH: 30.6 pg (ref 26.0–34.0)
MCHC: 33.9 g/dL (ref 30.0–36.0)
MCV: 90.4 fL (ref 78.0–100.0)
Platelets: 177 10*3/uL (ref 150–400)
RBC: 4.18 MIL/uL (ref 3.87–5.11)
RDW: 15.4 % (ref 11.5–15.5)
WBC: 8.3 10*3/uL (ref 4.0–10.5)

## 2014-08-30 LAB — GLUCOSE, CAPILLARY: Glucose-Capillary: 94 mg/dL (ref 70–99)

## 2014-08-30 LAB — TYPE AND SCREEN
ABO/RH(D): A NEG
ANTIBODY SCREEN: NEGATIVE

## 2014-08-30 LAB — ABO/RH: ABO/RH(D): A NEG

## 2014-08-30 MED ORDER — ZOLPIDEM TARTRATE 5 MG PO TABS
5.0000 mg | ORAL_TABLET | Freq: Every evening | ORAL | Status: DC | PRN
  Administered 2014-08-30: 5 mg via ORAL
  Filled 2014-08-30: qty 1

## 2014-08-30 MED ORDER — MISOPROSTOL 25 MCG QUARTER TABLET
25.0000 ug | ORAL_TABLET | ORAL | Status: DC | PRN
  Administered 2014-08-30 – 2014-08-31 (×4): 25 ug via VAGINAL
  Filled 2014-08-30 (×4): qty 0.25

## 2014-08-30 MED ORDER — LACTATED RINGERS IV SOLN
500.0000 mL | INTRAVENOUS | Status: DC | PRN
  Administered 2014-09-02 (×2): 500 mL via INTRAVENOUS

## 2014-08-30 MED ORDER — ONDANSETRON HCL 4 MG/2ML IJ SOLN: 4.0000 mg | Freq: Four times a day (QID) | INTRAMUSCULAR | Status: DC | PRN

## 2014-08-30 MED ORDER — FLEET ENEMA 7-19 GM/118ML RE ENEM: 1.0000 | ENEMA | RECTAL | Status: DC | PRN

## 2014-08-30 MED ORDER — OXYCODONE-ACETAMINOPHEN 5-325 MG PO TABS: 2.0000 | ORAL_TABLET | ORAL | Status: DC | PRN

## 2014-08-30 MED ORDER — LIDOCAINE HCL (PF) 1 % IJ SOLN: 30.0000 mL | INTRAMUSCULAR | Status: DC | PRN

## 2014-08-30 MED ORDER — CITRIC ACID-SODIUM CITRATE 334-500 MG/5ML PO SOLN
30.0000 mL | ORAL | Status: DC | PRN
  Administered 2014-09-02: 30 mL via ORAL
  Filled 2014-08-30: qty 15

## 2014-08-30 MED ORDER — OXYTOCIN 40 UNITS IN LACTATED RINGERS INFUSION - SIMPLE MED: 62.5000 mL/h | INTRAVENOUS | Status: DC

## 2014-08-30 MED ORDER — ACETAMINOPHEN 325 MG PO TABS
650.0000 mg | ORAL_TABLET | ORAL | Status: DC | PRN
  Administered 2014-09-01: 650 mg via ORAL
  Filled 2014-08-30 (×2): qty 2

## 2014-08-30 MED ORDER — LACTATED RINGERS IV SOLN
INTRAVENOUS | Status: DC
Start: 2014-08-30 — End: 2014-09-03
  Administered 2014-08-30: 21:00:00 via INTRAVENOUS
  Administered 2014-08-30: 125 mL/h via INTRAVENOUS
  Administered 2014-08-31 – 2014-09-02 (×5): via INTRAVENOUS

## 2014-08-30 MED ORDER — OXYCODONE-ACETAMINOPHEN 5-325 MG PO TABS: 1.0000 | ORAL_TABLET | ORAL | Status: DC | PRN

## 2014-08-30 MED ORDER — TERBUTALINE SULFATE 1 MG/ML IJ SOLN: 0.2500 mg | Freq: Once | INTRAMUSCULAR | Status: AC | PRN

## 2014-08-30 MED ORDER — OXYTOCIN BOLUS FROM INFUSION: 500.0000 mL | INTRAVENOUS | Status: DC

## 2014-08-30 NOTE — Progress Notes (Signed)
BPP/ Growth/ AFI with Radiology 08/30/14 @ 1045a.  Aware to Notify Dr. Penne LashLeggett with results [id number].

## 2014-08-30 NOTE — H&P (Signed)
Obstetric History and Physical  Misty Little is a 38 y.o. G2P0010 with IUP at 3480w0d presenting for IOL for GHTN. Had several 140/90s at home, had 132/90 and 141/104 in clinic today. Earlier during her prenatal course, she has 144/91 at 8026w6d.  Given concern for GHTN and her GA, the decision was made to proceed with IOL.  Clinic cervical exam 0.5/thick/posterior/-3. Denies headaches, visual symptoms, abdominal pain.  Patient was noted to have an elevated fundal height; ultrasound showed EFW of 8 lb 6 oz.   Patient states she has been having  no contractions, no vaginal bleeding, intact membranes, with active fetal movement.    Prenatal [facility]  with onset of care at 10 weeks Pregnancy complications or risks: Patient Active Problem List   Diagnosis Date Noted  . Gestational hypertension 08/30/2014  . Dizziness 05/04/2014  . Obesity 04/03/2014  . Antepartum multigravida of advanced maternal age 47/09/2014  . 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 Rh negative state in antepartum period 01/03/2014   Clinic HRC  Genetic Screen  Panorama--normal female  NT: wnl  AFP  Anatomic US wnl with limited views--f/u @ 28 weeks, still limited, f/u 32-34 weeks  Glucose Screen Early 83    27 wks:  135  TDaP vaccine  at her work, reports at 29w  Flu vaccine  at work, September 2015  CF Screen negative  GBS  negative  [employer]  breast  Contraception  OCPs  Circumcision  IP circ  Rhogam:Received 05/30/14  Prenatal labs and studies: ABO, Rh: --/--/A NEG, A NEG (10/01 1300) Antibody: NEG (10/01 1300) Rubella: 1.40 (02/03 0911) RPR: NON REAC (07/01 0931)  HBsAg: NEGATIVE (02/03 0911)  HIV: NONREACTIVE (07/01 0931)  ZOX:WRUEAVWUGBS:Negative (09/01 0000) 1 hr Glucola  135 Genetic screening normal Anatomy US normal   Past Medical History  Diagnosis Date  . Sturge-Weber syndrome with glaucoma   . Infection     uti  . Anemia   . Headache(784.0)   . Seizures     "small ones as child"    Past  Surgical History  Procedure Laterality Date  . Eye surgery  1977  . Toe surgery  1977    OB History  Gravida Para Term Preterm AB SAB TAB Ectopic Multiple Living  2 0 0 0 1 1 0 0 0 0     # Outcome Date GA Lbr Len/2nd Weight Sex Delivery Anes PTL Lv  2 CUR           1 SAB 08/2013 6837w0d            Comments: no complications      History   Social History  . Marital Status: Married    Spouse Name: N/A    Number of Children: N/A  . Years of Education: N/A   Social History Main Topics  . Smoking status: Never Smoker   . Smokeless tobacco: Never Used  . Alcohol Use: No     Comment: occasional before pregnancy  . Drug Use: No  . Sexual Activity: Yes    Birth Control/ Protection: None   Other Topics Concern  . None   Social History Narrative  . None    Family History  Problem Relation Age of Onset  . Heart disease Mother   . Diabetes Maternal Aunt   . Diabetes Maternal Grandmother   . Hearing loss Neg Hx   . Hypertension Father     Prescriptions prior to admission  Medication Sig Dispense Refill  .  calcium carbonate (TUMS - DOSED IN MG ELEMENTAL CALCIUM) 500 MG chewable tablet Chew 2 tablets by mouth 3 (three) times daily as needed for indigestion or heartburn.      . Prenatal Vit-Fe Fumarate-FA (PRENATAL MULTIVITAMIN) TABS tablet Take 1 tablet by mouth daily at 12 noon.      . promethazine (PHENERGAN) 25 MG tablet Take 12.5 mg by mouth every 6 (six) hours as needed for nausea or vomiting.        Allergies  Allergen Reactions  . Other Other (See Comments)    Pt states that she is allergic to walnuts- Reaction:  Causes tongue to hurt.     Review of Systems: Negative except for what is mentioned in HPI.  Physical Exam: BP 117/56  Pulse 84  Temp(Src) 98 F (36.7 C) (Oral)  Resp 18  Ht 5' 1.4" (1.56 m)  Wt 294 lb (133.358 kg)  BMI 54.80 kg/m2  LMP 11/23/2013 GENERAL: Well-developed, well-nourished female in no acute distress.  LUNGS: Clear to auscultation  bilaterally.  HEART: Regular rate and rhythm. ABDOMEN: Soft, nontender, nondistended, gravid. EXTREMITIES: Nontender, no edema, 2+ distal pulses. Cervical Exam: Dilation: Fingertip Effacement (%): Thick Cervical Position: Posterior Station: -3  Presentation: cephalic FHT:  Baseline rate 140 bpm   Variability moderate  Accelerations present   Decelerations none Contractions: None   Pertinent Labs/Studies:   Results for orders placed during the hospital encounter of 08/30/14 (from the past 24 hour(s))  CBC     Status: None   Collection Time    08/30/14  1:00 PM      Result Value Ref Range   WBC 8.3  4.0 - 10.5 K/uL   RBC 4.18  3.87 - 5.11 MIL/uL   Hemoglobin 12.8  12.0 - 15.0 g/dL   HCT 16.1  09.6 - 04.5 %   MCV 90.4  78.0 - 100.0 fL   MCH 30.6  26.0 - 34.0 pg   MCHC 33.9  30.0 - 36.0 g/dL   RDW 40.9  81.1 - 91.4 %   Platelets 177  150 - 400 K/uL  TYPE AND SCREEN     Status: None   Collection Time    08/30/14  1:00 PM      Result Value Ref Range   ABO/RH(D) A NEG     Antibody Screen NEG     Sample Expiration 09/02/2014    ABO/RH     Status: None   Collection Time    08/30/14  1:00 PM      Result Value Ref Range   ABO/RH(D) A NEG      Assessment : Misty Little is a 38 y.o. G2P0010 at [redacted]w[redacted]d being admitted for IOL for GHTN.  Plan: Labor: Induction per protocol.  Cytotec ordered for now, may need foley bulb later, then pitocin and AROM GHTN: Stable BP on evaluation in L&D. Will continue to monitor. No signs/symptoms of preeclampsia. Sturge-Weber syndrome: Negative MRI of brain, lumbosacral region for any AVM. Anesthesia aware. FWB: Reassuring fetal heart tracing.  GBS negative Delivery plan: Hopeful for vaginal delivery    Jaynie Collins, MD, FACOG Attending Obstetrician & Gynecologist Faculty Practice, [facility] [place]

## 2014-08-30 NOTE — Progress Notes (Signed)
Pt having regurg symptoms--pt advised to stop caffeine, elevate head of bed. US for size greater than dates.  Also, Glucola was 135 three months ago.  Will rpt glucola today.  CBG 94 approx 2 hours after lucky charms. Pt has new elevated BP.  Pt's mother is nurse and she has had several BPs 140s / 90s at home.  Given teh home BPs and elevated BPs here at term, will proceed with induction.  Will still get US for growth given large fundal height.

## 2014-08-30 NOTE — Progress Notes (Signed)
Misty Little is a 38 y.o. G2P0010 at 2481w0d by LMP admitted for induction of labor due to Hypertension.  Subjective: Doing well  Objective: BP 112/45  Pulse 86  Temp(Src) 98.2 F (36.8 C) (Oral)  Resp 20  Ht 5' 1.4" (1.56 m)  Wt 133.358 kg (294 lb)  BMI 54.80 kg/m2  LMP 11/23/2013      FHT:  FHR: 160 bpm, variability: moderate,  accelerations:  Present,  decelerations:  Present few early UC:   irregular, every 2-6 minutes SVE:   Dilation: Fingertip Effacement (%): Thick Station: -3 Exam by:: dr Waynetta Sandywight  Labs: Lab Results  Component Value Date   WBC 8.3 08/30/2014   HGB 12.8 08/30/2014   HCT 37.8 08/30/2014   MCV 90.4 08/30/2014   PLT 177 08/30/2014    Assessment / Plan: Induction of labor due to gestational hypertension 3rd cytotec now  Labor: Progressing normally Preeclampsia:  no signs or symptoms of toxicity Fetal Wellbeing:  Category II Pain Control:  planned Epidural, seen already by anesthesia I/D:  n/a Anticipated MOD:  NSVD  Tawni CarnesWight, Saagar Tortorella 08/30/2014, 10:55 PM

## 2014-08-30 NOTE — Progress Notes (Signed)
Patient reports pelvic pressure/pain and contractions but not very strong; reports difficulty breathing at night

## 2014-08-30 NOTE — Progress Notes (Signed)
1hr gtt given. Blood to be drawn at 1134.

## 2014-08-31 LAB — CBC
HCT: 37.7 % (ref 36.0–46.0)
HEMOGLOBIN: 12.7 g/dL (ref 12.0–15.0)
MCH: 30.6 pg (ref 26.0–34.0)
MCHC: 33.7 g/dL (ref 30.0–36.0)
MCV: 90.8 fL (ref 78.0–100.0)
PLATELETS: 174 10*3/uL (ref 150–400)
RBC: 4.15 MIL/uL (ref 3.87–5.11)
RDW: 15.6 % — ABNORMAL HIGH (ref 11.5–15.5)
WBC: 11.6 10*3/uL — ABNORMAL HIGH (ref 4.0–10.5)

## 2014-08-31 LAB — COMPREHENSIVE METABOLIC PANEL
ALT: 8 U/L (ref 0–35)
AST: 18 U/L (ref 0–37)
Albumin: 2.4 g/dL — ABNORMAL LOW (ref 3.5–5.2)
Alkaline Phosphatase: 110 U/L (ref 39–117)
Anion gap: 12 (ref 5–15)
BUN: 12 mg/dL (ref 6–23)
CO2: 21 mEq/L (ref 19–32)
Calcium: 9 mg/dL (ref 8.4–10.5)
Chloride: 103 mEq/L (ref 96–112)
Creatinine, Ser: 0.52 mg/dL (ref 0.50–1.10)
GFR calc Af Amer: >90 mL/min (ref >90)
GFR calc non Af Amer: >90 mL/min (ref >90)
GLUCOSE: 136 mg/dL — AB (ref 70–99)
Potassium: 4.1 mEq/L (ref 3.7–5.3)
SODIUM: 136 meq/L — AB (ref 137–147)
Total Bilirubin: <0.2 mg/dL — ABNORMAL LOW (ref 0.3–1.2)
Total Protein: 6.6 g/dL (ref 6.0–8.3)

## 2014-08-31 LAB — HIV ANTIBODY (ROUTINE TESTING W REFLEX): HIV 1&2 Ab, 4th Generation: NONREACTIVE

## 2014-08-31 LAB — GLUCOSE TOLERANCE, 1 HOUR (50G) W/O FASTING: Glucose, 1 Hour GTT: 117 mg/dL (ref 70–140)

## 2014-08-31 LAB — PROTEIN / CREATININE RATIO, URINE
Creatinine, Urine: 90.68 mg/dL
PROTEIN CREATININE RATIO: 0.12 (ref 0.00–0.15)
TOTAL PROTEIN, URINE: 11.3 mg/dL

## 2014-08-31 LAB — RPR

## 2014-08-31 MED ORDER — FENTANYL CITRATE 0.05 MG/ML IJ SOLN
100.0000 ug | INTRAMUSCULAR | Status: DC | PRN
  Administered 2014-08-31: 100 ug via INTRAVENOUS
  Filled 2014-08-31 (×4): qty 2

## 2014-08-31 MED ORDER — LABETALOL HCL 5 MG/ML IV SOLN
10.0000 mg | INTRAVENOUS | Status: DC | PRN
Start: 2014-08-31 — End: 2014-09-03
  Administered 2014-08-31: 10 mg via INTRAVENOUS
  Filled 2014-08-31: qty 4

## 2014-08-31 MED ORDER — TERBUTALINE SULFATE 1 MG/ML IJ SOLN: 0.2500 mg | Freq: Once | INTRAMUSCULAR | Status: AC | PRN

## 2014-08-31 MED ORDER — OXYTOCIN 40 UNITS IN LACTATED RINGERS INFUSION - SIMPLE MED
1.0000 m[IU]/min | INTRAVENOUS | Status: DC
  Administered 2014-08-31: 2 m[IU]/min via INTRAVENOUS
  Administered 2014-09-01: 10 m[IU]/min via INTRAVENOUS
  Administered 2014-09-02 (×2): 14 m[IU]/min via INTRAVENOUS
  Administered 2014-09-02: 10 m[IU]/min via INTRAVENOUS
  Filled 2014-08-31 (×2): qty 1000

## 2014-08-31 NOTE — Progress Notes (Signed)
Foley bulb out, pt states she is having a lot of pain in back.  Requests iv pain medicine.  SVE done.  IV labetalol given per orders

## 2014-08-31 NOTE — Progress Notes (Signed)
Provider on unit, report given, cervical change, foley bulb out and vs.  Orders received for IV pain medicine.

## 2014-08-31 NOTE — Progress Notes (Signed)
Provider notified of b/p's.  No new orders at this time.

## 2014-08-31 NOTE — Progress Notes (Signed)
   Misty Little is a 38 y.o. G2P0010 at 3368w1d  admitted for induction of labor due to Hypertension.  Subjective:  No c/o Objective: Filed Vitals:   08/31/14 0408 08/31/14 0500 08/31/14 0603 08/31/14 0605  BP:    138/68  Pulse:    73  Temp:   97.7 F (36.5 C)   TempSrc:   Oral   Resp: 20 18    Height:      Weight:          FHT:  FHR: 140 bpm, variability: moderate,  accelerations:  Present,  decelerations:  Absent UC:   none SVE:   Dilation: Fingertip Effacement (%): Thick Station: -3 Exam by:: dr Waynetta Sandywight Foley inserted into cx and inflated with 60cc H20  Labs: Lab Results  Component Value Date   WBC 8.3 08/30/2014   HGB 12.8 08/30/2014   HCT 37.8 08/30/2014   MCV 90.4 08/30/2014   PLT 177 08/30/2014    Assessment / Plan: IOL for GHTN, ripening phase  Labor: no Fetal Wellbeing:  Category I Pain Control:  Labor support without medications Anticipated MOD:  NSVD  CRESENZO-DISHMAN,Arantxa Piercey 08/31/2014, 8:14 AM

## 2014-08-31 NOTE — Progress Notes (Signed)
   Misty Little is a 38 y.o. G2P0010 at 3239w1d  admitted for induction of labor due to Hypertension.  Subjective:   Objective: Filed Vitals:   08/31/14 1801 08/31/14 1832 08/31/14 1841 08/31/14 1901  BP: 137/74 147/104 156/82 177/95  Pulse: 84 92 86 104  Temp: 97.8 F (36.6 C)     TempSrc: Oral     Resp: 18 18  18   Height:      Weight:          FHT:  FHR: 150 bpm, variability: moderate,  accelerations:  Present,  decelerations:  Absent UC:   regular, every 2-5 minutes SVE:   Dilation: Fingertip Effacement (%): 50 Station: -2 Exam by:: dr Loreta Aveacosta Pitocin @ 9 mu/min  Labs: Lab Results  Component Value Date   WBC 11.6* 08/31/2014   HGB 12.7 08/31/2014   HCT 37.7 08/31/2014   MCV 90.8 08/31/2014   PLT 174 08/31/2014    Assessment / Plan: Induction of labor due to hypertension,  progressing on pitocin, foley bulb still in place. Intermittently hypertensive.   Labor: Progressing on Pitocin, will continue to increase then AROM Fetal Wellbeing:  Category I Pain Control:  None Anticipated MOD:  NSVD Hypertension: Labetatol prn   Misty Little, Misty Little 08/31/2014, 7:26 PM

## 2014-08-31 NOTE — Progress Notes (Signed)
Vaginal foley inserted by Drenda FreezeFran cnm. Bulb filled with 60 cc LR taped to leg in place

## 2014-08-31 NOTE — Progress Notes (Signed)
Misty Little is a 38 y.o. G2P0010 at 4031w0d by LMP admitted for induction of labor due to Hypertension.  Subjective: Sleeping, slight pain in back better with position change  Objective: BP 112/45  Pulse 86  Temp(Src) 98.2 F (36.8 C) (Oral)  Resp 18  Ht 5' 1.4" (1.56 m)  Wt 133.358 kg (294 lb)  BMI 54.80 kg/m2  LMP 11/23/2013      FHT:  FHR: 150 bpm, variability: moderate,  accelerations:  Present,  decelerations:  Absent UC:   irregular, every 2-5 minutes SVE:   Dilation: Fingertip Effacement (%): Thick Station: -3 Exam by:: dr Waynetta Sandywight  Labs: Lab Results  Component Value Date   WBC 8.3 08/30/2014   HGB 12.8 08/30/2014   HCT 37.8 08/30/2014   MCV 90.4 08/30/2014   PLT 177 08/30/2014    Assessment / Plan: Induction of labor due to gestational hypertension 4th cytotec now, hopeful for foley bulb at next check  Labor: Progressing normally Preeclampsia:  no signs or symptoms of toxicity Fetal Wellbeing:  Category I Pain Control:  planned Epidural, seen/cleared already by anesthesia I/D:  n/a Anticipated MOD:  NSVD  Misty Little, Misty Little 08/31/2014, 3:05 AM

## 2014-09-01 ENCOUNTER — Inpatient Hospital Stay (HOSPITAL_COMMUNITY): Payer: BC Managed Care – PPO | Admitting: Anesthesiology

## 2014-09-01 ENCOUNTER — Encounter (HOSPITAL_COMMUNITY): Payer: BC Managed Care – PPO | Admitting: Anesthesiology

## 2014-09-01 ENCOUNTER — Encounter (HOSPITAL_COMMUNITY): Payer: Self-pay | Admitting: Anesthesiology

## 2014-09-01 LAB — CBC
HCT: 37.2 % (ref 36.0–46.0)
HCT: 35.2 % — ABNORMAL LOW (ref 36.0–46.0)
HEMOGLOBIN: 11.7 g/dL — AB (ref 12.0–15.0)
Hemoglobin: 12.4 g/dL (ref 12.0–15.0)
MCH: 30.2 pg (ref 26.0–34.0)
MCH: 30.1 pg (ref 26.0–34.0)
MCHC: 33.3 g/dL (ref 30.0–36.0)
MCHC: 33.2 g/dL (ref 30.0–36.0)
MCV: 90.5 fL (ref 78.0–100.0)
MCV: 90.5 fL (ref 78.0–100.0)
Platelets: 160 10*3/uL (ref 150–400)
Platelets: 159 10*3/uL (ref 150–400)
RBC: 4.11 MIL/uL (ref 3.87–5.11)
RBC: 3.89 MIL/uL (ref 3.87–5.11)
RDW: 15.8 % — ABNORMAL HIGH (ref 11.5–15.5)
RDW: 15.9 % — ABNORMAL HIGH (ref 11.5–15.5)
WBC: 11.6 10*3/uL — ABNORMAL HIGH (ref 4.0–10.5)
WBC: 10.2 10*3/uL (ref 4.0–10.5)

## 2014-09-01 LAB — COMPREHENSIVE METABOLIC PANEL
ALBUMIN: 2.1 g/dL — AB (ref 3.5–5.2)
ALK PHOS: 107 U/L (ref 39–117)
ALT: 7 U/L (ref 0–35)
AST: 16 U/L (ref 0–37)
Anion gap: 13 (ref 5–15)
BUN: 9 mg/dL (ref 6–23)
CO2: 20 mEq/L (ref 19–32)
Calcium: 8.2 mg/dL — ABNORMAL LOW (ref 8.4–10.5)
Chloride: 103 mEq/L (ref 96–112)
Creatinine, Ser: 0.63 mg/dL (ref 0.50–1.10)
GFR calc Af Amer: >90 mL/min (ref >90)
GFR calc non Af Amer: >90 mL/min (ref >90)
GLUCOSE: 90 mg/dL (ref 70–99)
POTASSIUM: 4 meq/L (ref 3.7–5.3)
SODIUM: 136 meq/L — AB (ref 137–147)
Total Bilirubin: 0.3 mg/dL (ref 0.3–1.2)
Total Protein: 5.6 g/dL — ABNORMAL LOW (ref 6.0–8.3)

## 2014-09-01 MED ORDER — MAGNESIUM SULFATE BOLUS VIA INFUSION
4.0000 g | Freq: Once | INTRAVENOUS | Status: DC
  Filled 2014-09-01: qty 500

## 2014-09-01 MED ORDER — HYDRALAZINE HCL 20 MG/ML IJ SOLN: 10.0000 mg | INTRAMUSCULAR | Status: DC | PRN

## 2014-09-01 MED ORDER — PHENYLEPHRINE 40 MCG/ML (10ML) SYRINGE FOR IV PUSH (FOR BLOOD PRESSURE SUPPORT): 80.0000 ug | PREFILLED_SYRINGE | INTRAVENOUS | Status: DC | PRN

## 2014-09-01 MED ORDER — EPHEDRINE 5 MG/ML INJ: 10.0000 mg | INTRAVENOUS | Status: DC | PRN

## 2014-09-01 MED ORDER — LACTATED RINGERS IV SOLN: 500.0000 mL | Freq: Once | INTRAVENOUS | Status: DC

## 2014-09-01 MED ORDER — BUTALBITAL-APAP-CAFFEINE 50-325-40 MG PO TABS
2.0000 | ORAL_TABLET | ORAL | Status: DC | PRN
  Administered 2014-09-01 – 2014-09-02 (×2): 2 via ORAL
  Administered 2014-09-02: 1 via ORAL
  Filled 2014-09-01: qty 2
  Filled 2014-09-01: qty 1
  Filled 2014-09-01: qty 2

## 2014-09-01 MED ORDER — LIDOCAINE HCL (PF) 1 % IJ SOLN
INTRAMUSCULAR | Status: DC | PRN
  Administered 2014-09-01 (×2): 4 mL

## 2014-09-01 MED ORDER — DIPHENHYDRAMINE HCL 50 MG/ML IJ SOLN
12.5000 mg | INTRAMUSCULAR | Status: AC | PRN
  Administered 2014-09-01 (×3): 12.5 mg via INTRAVENOUS
  Filled 2014-09-01 (×3): qty 1

## 2014-09-01 MED ORDER — PHENYLEPHRINE 40 MCG/ML (10ML) SYRINGE FOR IV PUSH (FOR BLOOD PRESSURE SUPPORT)
PREFILLED_SYRINGE | INTRAVENOUS | Status: AC
  Filled 2014-09-01: qty 10

## 2014-09-01 MED ORDER — FENTANYL 2.5 MCG/ML BUPIVACAINE 1/10 % EPIDURAL INFUSION (WH - ANES)
INTRAMUSCULAR | Status: AC
  Filled 2014-09-01: qty 125

## 2014-09-01 MED ORDER — FENTANYL 2.5 MCG/ML BUPIVACAINE 1/10 % EPIDURAL INFUSION (WH - ANES)
INTRAMUSCULAR | Status: DC | PRN
  Administered 2014-09-01: 12 mL/h via EPIDURAL

## 2014-09-01 MED ORDER — PHENYLEPHRINE 40 MCG/ML (10ML) SYRINGE FOR IV PUSH (FOR BLOOD PRESSURE SUPPORT)
80.0000 ug | PREFILLED_SYRINGE | INTRAVENOUS | Status: DC | PRN
  Filled 2014-09-01 (×2): qty 10

## 2014-09-01 MED ORDER — FENTANYL 2.5 MCG/ML BUPIVACAINE 1/10 % EPIDURAL INFUSION (WH - ANES)
14.0000 mL/h | INTRAMUSCULAR | Status: DC | PRN
  Administered 2014-09-01 – 2014-09-02 (×5): 14 mL/h via EPIDURAL
  Administered 2014-09-02: 16 mL/h via EPIDURAL
  Filled 2014-09-01 (×5): qty 125

## 2014-09-01 MED ORDER — METOCLOPRAMIDE HCL 5 MG/ML IJ SOLN
10.0000 mg | Freq: Four times a day (QID) | INTRAMUSCULAR | Status: DC
  Administered 2014-09-01 – 2014-09-02 (×2): 10 mg via INTRAVENOUS
  Filled 2014-09-01 (×2): qty 2

## 2014-09-01 MED ORDER — EPHEDRINE 5 MG/ML INJ
INTRAVENOUS | Status: AC
  Filled 2014-09-01: qty 4

## 2014-09-01 MED ORDER — MAGNESIUM SULFATE 40 G IN LACTATED RINGERS - SIMPLE
2.0000 g/h | INTRAVENOUS | Status: DC
  Administered 2014-09-01: 4 g/h via INTRAVENOUS
  Administered 2014-09-01: 2 g/h via INTRAVENOUS
  Filled 2014-09-01: qty 500

## 2014-09-01 NOTE — Anesthesia Preprocedure Evaluation (Deleted)
Anesthesia Evaluation  Patient identified by MRN, date of birth, ID band Patient awake    Reviewed: Allergy & Precautions, H&P , Patient's Chart, lab work & pertinent test results  Airway Mallampati: III TM Distance: >3 FB Neck ROM: Full    Dental no notable dental hx. (+) Teeth Intact   Pulmonary neg pulmonary ROS,  breath sounds clear to auscultation  Pulmonary exam normal       Cardiovascular hypertension, Rhythm:Regular Rate:Normal     Neuro/Psych  Headaches, Seizures -, Well Controlled,  negative psych ROS   GI/Hepatic Neg liver ROS, GERD-  Medicated and Controlled,  Endo/Other  Morbid obesity  Renal/GU negative Renal ROS  negative genitourinary   Musculoskeletal negative musculoskeletal ROS (+)   Abdominal (+) + obese,   Peds  Hematology  (+) anemia ,   Anesthesia Other Findings   Reproductive/Obstetrics (+) Pregnancy                           Anesthesia Physical Anesthesia Plan  ASA: III  Anesthesia Plan: Epidural   Post-op Pain Management:    Induction:   Airway Management Planned: Natural Airway  Additional Equipment:   Intra-op Plan:   Post-operative Plan:   Informed Consent: I have reviewed the patients History and Physical, chart, labs and discussed the procedure including the risks, benefits and alternatives for the proposed anesthesia with the patient or authorized representative who has indicated his/her understanding and acceptance.     Plan Discussed with:   Anesthesia Plan Comments:         Anesthesia Quick Evaluation

## 2014-09-01 NOTE — Progress Notes (Signed)
Beacon monitors need to recharge, applied external monitors.

## 2014-09-01 NOTE — Progress Notes (Signed)
This note also relates to the following rows which could not be included: Pulse Rate - Cannot attach notes to unvalidated device data SpO2 - Cannot attach notes to unvalidated device data   Monitors off for epidural.  Dr. Malen GauzeFoster in room

## 2014-09-01 NOTE — Progress Notes (Signed)
LABOR PROGRESS NOTE  Misty Little is a 38 y.o. G2P0010 at 9750w2d  ad[facility], also has sturge-weber.  Subjective: comfortable  Objective: BP 142/72  Pulse 82  Temp(Src) 98.5 F (36.9 C) (Oral)  Resp 20  Ht 5' 1.4" (1.56 m)  Wt 294 lb (133.358 kg)  BMI 54.80 kg/m2  SpO2 98%  LMP 11/23/2013 or  Filed Vitals:   09/01/14 1500 09/01/14 1530 09/01/14 1600 09/01/14 1630  BP: 151/72 141/69 157/75 142/72  Pulse: 73 78 79 82  Temp: 98.5 F (36.9 C)     TempSrc: Oral     Resp: 20 20 20 20   Height:      Weight:      SpO2:           FHT:  FHR: 130 bpm, variability: moderate,  accelerations:  Present,  decelerations:  Absent UC:   Unable to trace on toco  SVE:   Dilation: 4 Effacement (%): 80 Station: Ballotable Exam by:: Dr Loreta AveAcosta  Dilation: 4 Effacement (%): 80 Cervical Position: Posterior Station: Ballotable Presentation: Vertex Exam by:: Dr Loreta AveAcosta  Pitocin @ 24 mu/min  Labs: Lab Results  Component Value Date   WBC 11.6* 09/01/2014   HGB 12.4 09/01/2014   HCT 37.2 09/01/2014   MCV 90.5 09/01/2014   PLT 160 09/01/2014    Assessment / Plan: Induction of labor due to gestational hypertension,  progressing well on pitocin  Labor: turn off pitocin, started on pit 1600 yesterday, had 1 hour pit break at 0700 this morning.  minimal cervical change, will turn off pit x 1.5hr, restart afterwards and plan for recheck 2000 Fetal Wellbeing:  Category I Pain Control:  Epidural Anticipated MOD:  guarded  Liviya Santini ROCIO, MD 09/01/2014, 5:42 PM

## 2014-09-01 NOTE — Anesthesia Procedure Notes (Signed)
Epidural Patient location during procedure: OB Start time: 09/01/2014 5:54 AM  Staffing Anesthesiologist: Adarius Tigges A. Performed by: anesthesiologist   Preanesthetic Checklist Completed: patient identified, site marked, surgical consent, pre-op evaluation, timeout performed, IV checked, risks and benefits discussed and monitors and equipment checked  Epidural Patient position: sitting Prep: site prepped and draped and DuraPrep Patient monitoring: continuous pulse ox and blood pressure Approach: midline Location: L3-L4 Injection technique: LOR air  Needle:  Needle type: Tuohy  Needle gauge: 17 G Needle length: 9 cm and 9 Needle insertion depth: 6 cm Catheter type: closed end flexible Catheter size: 19 Gauge Catheter at skin depth: 11 cm Test dose: negative and Other  Assessment Events: blood not aspirated, injection not painful, no injection resistance, negative IV test and no paresthesia  Additional Notes Patient identified. Risks and benefits discussed including failed block, incomplete  Pain control, post dural puncture headache, nerve damage, paralysis, blood pressure Changes, nausea, vomiting, reactions to medications-both toxic and allergic and post Partum back pain. All questions were answered. Patient expressed understanding and wished to proceed. Sterile technique was used throughout procedure. Epidural site was Dressed with sterile barrier dressing. No paresthesias, signs of intravascular injection Or signs of intrathecal spread were encountered.  Patient was more comfortable after the epidural was dosed. Please see RN's note for documentation of vital signs and FHR which are stable.

## 2014-09-01 NOTE — Progress Notes (Signed)
Pt has High Postpartum Hemorrage risk due to BMI and Gestational PIH.  Score would be 10

## 2014-09-01 NOTE — Progress Notes (Signed)
This note also relates to the following rows which could not be included: Pulse Rate - Cannot attach notes to unvalidated device data SpO2 - Cannot attach notes to unvalidated device data Dose (milli-units/min) Oxytocin - Cannot attach notes to extension rows Rate (mL/hr) Oxytocin - Cannot attach notes to extension rows Concentration Oxytocin - Cannot attach notes to extension rows  Provider on unit, informed of pt's fhr with lates and decreased variability.  Provider orders to shut pitocin off for now.

## 2014-09-01 NOTE — Progress Notes (Signed)
Pt c/o being "dizzy", vs stable.  Will continue to monitor pt closely.

## 2014-09-01 NOTE — Progress Notes (Signed)
LABOR PROGRESS NOTE  Misty Little is a 38 y.o. G2P0010 at 4561w2d  [facility], also has sturge-weber.  Subjective: comfortable  Objective: BP 145/66  Pulse 74  Temp(Src) 98.5 F (36.9 C) (Oral)  Resp 20  Ht 5' 1.4" (1.56 m)  Wt 294 lb (133.358 kg)  BMI 54.80 kg/m2  SpO2 98%  LMP 11/23/2013 or  Filed Vitals:   09/01/14 1800 09/01/14 1830 09/01/14 1900 09/01/14 1931  BP: 143/67 149/63 157/75 145/66  Pulse: 76 69 78 74  Temp:   98.5 F (36.9 C)   TempSrc:   Oral   Resp: 20 20 20 20   Height:      Weight:      SpO2:           FHT:  FHR: 130 bpm, variability: moderate,  accelerations:  Present,  decelerations:  Absent UC:   Unable to trace on toco  SVE:   Dilation: 4 Effacement (%): 80 Station: Ballotable Exam by:: Dr Misty Little  Dilation: 4 Effacement (%): 80 Cervical Position: Posterior Station: Ballotable Presentation: Vertex Exam by:: Dr Misty Little  Pitocin @ 12 mu/min  Labs: Lab Results  Component Value Date   WBC 11.6* 09/01/2014   HGB 12.4 09/01/2014   HCT 37.2 09/01/2014   MCV 90.5 09/01/2014   PLT 160 09/01/2014    Assessment / Plan: Induction of labor due to gestational hypertension,  progressing well on pitocin  Labor: AROM this check by Dr. Shawnie Little, moderate mec Fetal Wellbeing:  Category I Pain Control:  Epidural Anticipated MOD:  guarded  Misty Little ROCIO, MD 09/01/2014, 9:12 PM

## 2014-09-01 NOTE — Progress Notes (Signed)
Beacon monitor quits, will recharge.  Pt up to br.

## 2014-09-01 NOTE — Anesthesia Preprocedure Evaluation (Addendum)
Anesthesia Evaluation  Patient identified by MRN, date of birth, ID band Patient awake    Reviewed: Allergy & Precautions, H&P , NPO status , Patient's Chart, lab work & pertinent test results  Airway Mallampati: III TM Distance: >3 FB Neck ROM: Full    Dental no notable dental hx. (+) Teeth Intact   Pulmonary neg pulmonary ROS,  breath sounds clear to auscultation  Pulmonary exam normal       Cardiovascular hypertension, Rhythm:Regular Rate:Normal  Sturge-Weber syndrome   Neuro/Psych  Headaches, Seizures -,  negative psych ROS   GI/Hepatic Neg liver ROS, GERD-  Medicated and Controlled,  Endo/Other  Morbid obesity  Renal/GU negative Renal ROS  negative genitourinary   Musculoskeletal MRI of Brain, C-Spine, Tspine and L-spine negative for AVM   Abdominal (+) + obese,   Peds  Hematology  (+) anemia ,   Anesthesia Other Findings   Reproductive/Obstetrics (+) Pregnancy                          Anesthesia Physical Anesthesia Plan  ASA: III and emergent  Anesthesia Plan: Epidural   Post-op Pain Management:    Induction:   Airway Management Planned: Natural Airway  Additional Equipment:   Intra-op Plan:   Post-operative Plan:   Informed Consent: I have reviewed the patients History and Physical, chart, labs and discussed the procedure including the risks, benefits and alternatives for the proposed anesthesia with the patient or authorized representative who has indicated his/her understanding and acceptance.     Plan Discussed with: Anesthesiologist, CRNA and Surgeon  Anesthesia Plan Comments: (Patient for C/Section for failure to progress. Epidural has been dosed throughout today. The catheter may have dislodged or migrated. Will dose with 2% xylocaine in OR and if no level will perform SAB. )       Anesthesia Quick Evaluation

## 2014-09-02 ENCOUNTER — Encounter (HOSPITAL_COMMUNITY)
Admission: AD | Disposition: A | Discharge status: Home / Self Care | Payer: Self-pay | Source: Ambulatory Visit | Attending: Family Medicine

## 2014-09-02 ENCOUNTER — Encounter (HOSPITAL_COMMUNITY): Payer: Self-pay | Admitting: Anesthesiology

## 2014-09-02 DIAGNOSIS — Z3A40 40 weeks gestation of pregnancy: Secondary | ICD-10-CM

## 2014-09-02 DIAGNOSIS — O99214 Obesity complicating childbirth: Secondary | ICD-10-CM

## 2014-09-02 DIAGNOSIS — O411230 Chorioamnionitis, third trimester, not applicable or unspecified: Secondary | ICD-10-CM

## 2014-09-02 LAB — COMPREHENSIVE METABOLIC PANEL
ALT: 7 U/L (ref 0–35)
AST: 19 U/L (ref 0–37)
Albumin: 2.2 g/dL — ABNORMAL LOW (ref 3.5–5.2)
Alkaline Phosphatase: 115 U/L (ref 39–117)
Anion gap: 15 (ref 5–15)
BUN: 9 mg/dL (ref 6–23)
CALCIUM: 8.3 mg/dL — AB (ref 8.4–10.5)
CO2: 20 meq/L (ref 19–32)
CREATININE: 0.76 mg/dL (ref 0.50–1.10)
Chloride: 101 mEq/L (ref 96–112)
GFR calc non Af Amer: >90 mL/min (ref >90)
GLUCOSE: 92 mg/dL (ref 70–99)
Potassium: 3.9 mEq/L (ref 3.7–5.3)
Sodium: 136 mEq/L — ABNORMAL LOW (ref 137–147)
Total Bilirubin: 0.3 mg/dL (ref 0.3–1.2)
Total Protein: 6.2 g/dL (ref 6.0–8.3)

## 2014-09-02 LAB — CBC
HEMATOCRIT: 37 % (ref 36.0–46.0)
HEMATOCRIT: 30.3 % — AB (ref 36.0–46.0)
HEMOGLOBIN: 12.5 g/dL (ref 12.0–15.0)
Hemoglobin: 9.9 g/dL — ABNORMAL LOW (ref 12.0–15.0)
MCH: 30.7 pg (ref 26.0–34.0)
MCH: 29.5 pg (ref 26.0–34.0)
MCHC: 33.8 g/dL (ref 30.0–36.0)
MCHC: 32.7 g/dL (ref 30.0–36.0)
MCV: 90.9 fL (ref 78.0–100.0)
MCV: 90.2 fL (ref 78.0–100.0)
PLATELETS: 154 10*3/uL (ref 150–400)
Platelets: 153 10*3/uL (ref 150–400)
RBC: 4.07 MIL/uL (ref 3.87–5.11)
RBC: 3.36 MIL/uL — ABNORMAL LOW (ref 3.87–5.11)
RDW: 15.8 % — ABNORMAL HIGH (ref 11.5–15.5)
RDW: 15.8 % — AB (ref 11.5–15.5)
WBC: 12.7 10*3/uL — ABNORMAL HIGH (ref 4.0–10.5)
WBC: 17.2 10*3/uL — AB (ref 4.0–10.5)

## 2014-09-02 LAB — PROTEIN / CREATININE RATIO, URINE
Creatinine, Urine: 46.69 mg/dL
Protein Creatinine Ratio: 0.58 — ABNORMAL HIGH (ref 0.00–0.15)
Total Protein, Urine: 27.2 mg/dL

## 2014-09-02 LAB — MAGNESIUM
Magnesium: 4.1 mg/dL — ABNORMAL HIGH (ref 1.5–2.5)
Magnesium: 4.4 mg/dL — ABNORMAL HIGH (ref 1.5–2.5)

## 2014-09-02 LAB — PREPARE RBC (CROSSMATCH)

## 2014-09-02 SURGERY — Surgical Case
Anesthesia: Epidural

## 2014-09-02 MED ORDER — MEPERIDINE HCL 25 MG/ML IJ SOLN
INTRAMUSCULAR | Status: AC
  Filled 2014-09-02: qty 1

## 2014-09-02 MED ORDER — MAGNESIUM SULFATE 40 G IN LACTATED RINGERS - SIMPLE: 2.0000 g/h | INTRAVENOUS | Status: DC

## 2014-09-02 MED ORDER — OXYTOCIN 10 UNIT/ML IJ SOLN
INTRAMUSCULAR | Status: AC
  Filled 2014-09-02: qty 4

## 2014-09-02 MED ORDER — SODIUM CHLORIDE 0.9 % IR SOLN
Status: DC | PRN
  Administered 2014-09-02: 1

## 2014-09-02 MED ORDER — LACTATED RINGERS IV SOLN
INTRAVENOUS | Status: DC | PRN
  Administered 2014-09-02: 21:00:00 via INTRAVENOUS

## 2014-09-02 MED ORDER — MAGNESIUM SULFATE 40 G IN LACTATED RINGERS - SIMPLE
2.0000 g/h | INTRAVENOUS | Status: DC
  Administered 2014-09-02: 2 g/h via INTRAVENOUS
  Filled 2014-09-02 (×2): qty 500

## 2014-09-02 MED ORDER — GENTAMICIN SULFATE 40 MG/ML IJ SOLN
190.0000 mg | Freq: Three times a day (TID) | INTRAVENOUS | Status: DC
  Administered 2014-09-02 (×2): 190 mg via INTRAVENOUS
  Filled 2014-09-02 (×5): qty 4.75

## 2014-09-02 MED ORDER — ONDANSETRON HCL 4 MG/2ML IJ SOLN
INTRAMUSCULAR | Status: DC | PRN
  Administered 2014-09-02: 4 mg via INTRAVENOUS

## 2014-09-02 MED ORDER — SODIUM BICARBONATE 8.4 % IV SOLN
INTRAVENOUS | Status: DC | PRN
Start: 2014-09-02 — End: 2014-09-02
  Administered 2014-09-02 (×3): 5 mL via EPIDURAL

## 2014-09-02 MED ORDER — SCOPOLAMINE 1 MG/3DAYS TD PT72
MEDICATED_PATCH | TRANSDERMAL | Status: DC | PRN
  Administered 2014-09-02: 1 via TRANSDERMAL

## 2014-09-02 MED ORDER — CEFAZOLIN SODIUM 10 G IJ SOLR
3.0000 g | Freq: Once | INTRAMUSCULAR | Status: AC
  Administered 2014-09-02: 3 g via INTRAVENOUS
  Filled 2014-09-02: qty 3000

## 2014-09-02 MED ORDER — MORPHINE SULFATE (PF) 0.5 MG/ML IJ SOLN
INTRAMUSCULAR | Status: DC | PRN
  Administered 2014-09-02: 4 mg via EPIDURAL

## 2014-09-02 MED ORDER — MEPERIDINE HCL 25 MG/ML IJ SOLN
INTRAMUSCULAR | Status: DC | PRN
  Administered 2014-09-02 (×2): 12.5 mg via INTRAVENOUS

## 2014-09-02 MED ORDER — OXYTOCIN 10 UNIT/ML IJ SOLN
40.0000 [IU] | INTRAVENOUS | Status: DC | PRN
  Administered 2014-09-02: 40 [IU] via INTRAVENOUS

## 2014-09-02 MED ORDER — SODIUM BICARBONATE 8.4 % IV SOLN
INTRAVENOUS | Status: AC
  Filled 2014-09-02: qty 50

## 2014-09-02 MED ORDER — LACTATED RINGERS IV SOLN
INTRAVENOUS | Status: DC | PRN
  Administered 2014-09-02 (×3): via INTRAVENOUS

## 2014-09-02 MED ORDER — FENTANYL CITRATE 0.05 MG/ML IJ SOLN
INTRAMUSCULAR | Status: DC | PRN
Start: 2014-09-02 — End: 2014-09-02
  Administered 2014-09-02: 100 ug via EPIDURAL

## 2014-09-02 MED ORDER — FENTANYL CITRATE 0.05 MG/ML IJ SOLN: INTRAMUSCULAR | Status: DC | PRN

## 2014-09-02 MED ORDER — SODIUM CHLORIDE 0.9 % IV SOLN
2.0000 g | Freq: Four times a day (QID) | INTRAVENOUS | Status: DC
  Administered 2014-09-02 (×2): 2 g via INTRAVENOUS
  Filled 2014-09-02 (×6): qty 2000

## 2014-09-02 MED ORDER — SCOPOLAMINE 1 MG/3DAYS TD PT72
MEDICATED_PATCH | TRANSDERMAL | Status: AC
  Filled 2014-09-02: qty 1

## 2014-09-02 MED ORDER — METOCLOPRAMIDE HCL 5 MG/ML IJ SOLN: 10.0000 mg | INTRAMUSCULAR | Status: DC | PRN

## 2014-09-02 MED ORDER — MORPHINE SULFATE (PF) 0.5 MG/ML IJ SOLN
INTRAMUSCULAR | Status: DC | PRN
  Administered 2014-09-02: 1 mg via INTRAVENOUS

## 2014-09-02 MED ORDER — BUPIVACAINE HCL (PF) 0.25 % IJ SOLN
INTRAMUSCULAR | Status: DC | PRN
  Administered 2014-09-02 (×4): 10 mL via EPIDURAL

## 2014-09-02 MED ORDER — MAGNESIUM SULFATE 40 G IN LACTATED RINGERS - SIMPLE
1.0000 g/h | INTRAVENOUS | Status: DC
  Filled 2014-09-02: qty 500

## 2014-09-02 MED ORDER — MORPHINE SULFATE 0.5 MG/ML IJ SOLN
INTRAMUSCULAR | Status: AC
  Filled 2014-09-02: qty 10

## 2014-09-02 MED ORDER — FENTANYL CITRATE 0.05 MG/ML IJ SOLN: 25.0000 ug | INTRAMUSCULAR | Status: DC | PRN

## 2014-09-02 MED ORDER — LIDOCAINE-EPINEPHRINE (PF) 2 %-1:200000 IJ SOLN
INTRAMUSCULAR | Status: AC
  Filled 2014-09-02: qty 20

## 2014-09-02 MED ORDER — SODIUM CHLORIDE 0.9 % IV SOLN: Freq: Once | INTRAVENOUS | Status: DC

## 2014-09-02 MED ORDER — MEPERIDINE HCL 25 MG/ML IJ SOLN: 6.2500 mg | INTRAMUSCULAR | Status: DC | PRN

## 2014-09-02 MED ORDER — ONDANSETRON HCL 4 MG/2ML IJ SOLN
INTRAMUSCULAR | Status: AC
  Filled 2014-09-02: qty 2

## 2014-09-02 SURGICAL SUPPLY — 29 items
CLAMP CORD UMBIL (MISCELLANEOUS) IMPLANT
CONTAINER PREFILL 10% NBF 15ML (MISCELLANEOUS) IMPLANT
COVER LIGHT HANDLE  1/PK (MISCELLANEOUS) ×4
COVER LIGHT HANDLE 1/PK (MISCELLANEOUS) ×2 IMPLANT
DRAPE SHEET LG 3/4 BI-LAMINATE (DRAPES) IMPLANT
DRSG OPSITE POSTOP 4X10 (GAUZE/BANDAGES/DRESSINGS) ×3 IMPLANT
DURAPREP 26ML APPLICATOR (WOUND CARE) ×3 IMPLANT
ELECT REM PT RETURN 9FT ADLT (ELECTROSURGICAL) ×3
ELECTRODE REM PT RTRN 9FT ADLT (ELECTROSURGICAL) ×1 IMPLANT
EXTRACTOR VACUUM M CUP 4 TUBE (SUCTIONS) IMPLANT
EXTRACTOR VACUUM M CUP 4' TUBE (SUCTIONS)
GLOVE BIOGEL PI IND STRL 6.5 (GLOVE) ×1 IMPLANT
GLOVE BIOGEL PI INDICATOR 6.5 (GLOVE) ×2
GLOVE SURG SS PI 6.0 STRL IVOR (GLOVE) ×3 IMPLANT
GOWN STRL REUS W/TWL LRG LVL3 (GOWN DISPOSABLE) ×6 IMPLANT
KIT ABG SYR 3ML LUER SLIP (SYRINGE) IMPLANT
NEEDLE HYPO 25X5/8 SAFETYGLIDE (NEEDLE) IMPLANT
NS IRRIG 1000ML POUR BTL (IV SOLUTION) ×3 IMPLANT
PACK C SECTION WH (CUSTOM PROCEDURE TRAY) ×3 IMPLANT
PAD OB MATERNITY 4.3X12.25 (PERSONAL CARE ITEMS) ×3 IMPLANT
RTRCTR C-SECT PINK 25CM LRG (MISCELLANEOUS) IMPLANT
SEPRAFILM MEMBRANE 5X6 (MISCELLANEOUS) IMPLANT
STAPLER VISISTAT 35W (STAPLE) IMPLANT
SUT PLAIN 0 NONE (SUTURE) IMPLANT
SUT VIC AB 0 CT1 36 (SUTURE) ×12 IMPLANT
SUT VIC AB 4-0 KS 27 (SUTURE) ×3 IMPLANT
TOWEL OR 17X24 6PK STRL BLUE (TOWEL DISPOSABLE) ×3 IMPLANT
TRAY FOLEY CATH 14FR (SET/KITS/TRAYS/PACK) ×3 IMPLANT
WATER STERILE IRR 1000ML POUR (IV SOLUTION) ×3 IMPLANT

## 2014-09-02 NOTE — Progress Notes (Signed)
Patient and husband resistant to restart Pitocin at this time.  MD made aware and requested that provider come to speak with patient and family.  Provider states that rounds will be made shortly and they will come discuss with patient.

## 2014-09-02 NOTE — Progress Notes (Deleted)
Patient and family are "done" and want to speak with provider about plan of care.  They are ready to discuss C-section.

## 2014-09-02 NOTE — Progress Notes (Signed)
Called to clarify Magnesium and Pitocin ordered. Order to increase Mag to 2 gm and restart Pitocin was clarified.  Orders in Epic.

## 2014-09-02 NOTE — Progress Notes (Signed)
Patient ID: Misty Little, female   DOB: 05/04/1976, 38 y.o.   MRN: [id number] Misty Little is a 38 y.o. G2P0010 at 1162w3d.  Subjective: Very uncomfortable w/ epidural even after redosing.   Objective: BP 152/68  Pulse 93  Temp(Src) 98.3 F (36.8 C) (Oral)  Resp 24  Ht 5' 1.4" (1.56 m)  Wt 133.358 kg (294 lb)  BMI 54.80 kg/m2  SpO2 99%  LMP 11/23/2013   FHT:  FHR: 130 bpm, variability: min-mod,  accelerations:  15x15,  decelerations:  none UC:   Q 1-104minutes, moderate, MVU's irregular, 95-230's. Sporadically adequate Pitocin on 22. Dilation: 7.5 Effacement (%): 80 Cervical Position: Anterior Station: 0 Presentation: Vertex Exam by:: Cammy CopaJennifer Lineberry, RN  Total I/O In: 3593.6 [P.O.:1290; I.V.:2094.1; IV Piggyback:209.5] Out: 295 [Urine:295]  Output 294 over past 12 hours (average 25.5 cc's/hr) despite two 500 cc IV boluses. +3500 cc's since admission    Labs: Results for orders placed during the hospital encounter of 08/30/14 (from the past 24 hour(s))  CBC     Status: Abnormal   Collection Time    09/01/14 10:20 PM      Result Value Ref Range   WBC 10.2  4.0 - 10.5 K/uL   RBC 3.89  3.87 - 5.11 MIL/uL   Hemoglobin 11.7 (*) 12.0 - 15.0 g/dL   HCT 14.735.2 (*) 82.936.0 - 56.246.0 %   MCV 90.5  78.0 - 100.0 fL   MCH 30.1  26.0 - 34.0 pg   MCHC 33.2  30.0 - 36.0 g/dL   RDW 13.015.9 (*) 86.511.5 - 78.415.5 %   Platelets 159  150 - 400 K/uL  COMPREHENSIVE METABOLIC PANEL     Status: Abnormal   Collection Time    09/01/14 10:20 PM      Result Value Ref Range   Sodium 136 (*) 137 - 147 mEq/L   Potassium 4.0  3.7 - 5.3 mEq/L   Chloride 103  96 - 112 mEq/L   CO2 20  19 - 32 mEq/L   Glucose, Bld 90  70 - 99 mg/dL   BUN 9  6 - 23 mg/dL   Creatinine, Ser 6.960.63  0.50 - 1.10 mg/dL   Calcium 8.2 (*) 8.4 - 10.5 mg/dL   Total Protein 5.6 (*) 6.0 - 8.3 g/dL   Albumin 2.1 (*) 3.5 - 5.2 g/dL   AST 16  0 - 37 U/L   ALT 7  0 - 35 U/L   Alkaline Phosphatase 107  39 - 117 U/L   Total Bilirubin 0.3  0.3 - 1.2  mg/dL   GFR calc non Af Amer >90  >90 mL/min   GFR calc Af Amer >90  >90 mL/min   Anion gap 13  5 - 15  PROTEIN / CREATININE RATIO, URINE     Status: Abnormal   Collection Time    09/01/14 10:45 PM      Result Value Ref Range   Creatinine, Urine 46.69     Total Protein, Urine 27.2     Protein Creatinine Ratio 0.58 (*) 0.00 - 0.15  MAGNESIUM     Status: Abnormal   Collection Time    09/02/14  5:00 AM      Result Value Ref Range   Magnesium 4.1 (*) 1.5 - 2.5 mg/dL  TYPE AND SCREEN     Status: None   Collection Time    09/02/14  7:50 AM      Result Value Ref Range   ABO/RH(D) A NEG  Antibody Screen NEG     Sample Expiration 09/05/2014     Unit Number [id number]     Blood Component Type RED CELLS,LR     Unit division 00     Status of Unit ALLOCATED     Transfusion Status OK TO TRANSFUSE     Crossmatch Result Compatible     Unit Number [id number]     Blood Component Type RED CELLS,LR     Unit division 00     Status of Unit ALLOCATED     Transfusion Status OK TO TRANSFUSE     Crossmatch Result Compatible     Unit Number [id number]     Blood Component Type RED CELLS,LR     Unit division 00     Status of Unit ALLOCATED     Transfusion Status OK TO TRANSFUSE     Crossmatch Result Compatible     Unit Number [id number]     Blood Component Type RED CELLS,LR     Unit division 00     Status of Unit ALLOCATED     Transfusion Status OK TO TRANSFUSE     Crossmatch Result Compatible    PREPARE RBC (CROSSMATCH)     Status: None   Collection Time    09/02/14  8:00 AM      Result Value Ref Range   Order Confirmation ORDER PROCESSED BY BLOOD BANK    MAGNESIUM     Status: Abnormal   Collection Time    09/02/14  8:10 AM      Result Value Ref Range   Magnesium 4.4 (*) 1.5 - 2.5 mg/dL  CBC     Status: Abnormal   Collection Time    09/02/14  8:10 AM      Result Value Ref Range   WBC 12.7 (*) 4.0 - 10.5 K/uL   RBC 4.07  3.87 - 5.11 MIL/uL   Hemoglobin 12.5  12.0 -  15.0 g/dL   HCT 72.5  36.6 - 44.0 %   MCV 90.9  78.0 - 100.0 fL   MCH 30.7  26.0 - 34.0 pg   MCHC 33.8  30.0 - 36.0 g/dL   RDW 34.7 (*) 42.5 - 95.6 %   Platelets 154  150 - 400 K/uL  COMPREHENSIVE METABOLIC PANEL     Status: Abnormal   Collection Time    09/02/14  8:10 AM      Result Value Ref Range   Sodium 136 (*) 137 - 147 mEq/L   Potassium 3.9  3.7 - 5.3 mEq/L   Chloride 101  96 - 112 mEq/L   CO2 20  19 - 32 mEq/L   Glucose, Bld 92  70 - 99 mg/dL   BUN 9  6 - 23 mg/dL   Creatinine, Ser 3.87  0.50 - 1.10 mg/dL   Calcium 8.3 (*) 8.4 - 10.5 mg/dL   Total Protein 6.2  6.0 - 8.3 g/dL   Albumin 2.2 (*) 3.5 - 5.2 g/dL   AST 19  0 - 37 U/L   ALT 7  0 - 35 U/L   Alkaline Phosphatase 115  39 - 117 U/L   Total Bilirubin 0.3  0.3 - 1.2 mg/dL   GFR calc non Af Amer >90  >90 mL/min   GFR calc Af Amer >90  >90 mL/min   Anion gap 15  5 - 15    Assessment / Plan: [redacted]w[redacted]d week IUP Labor: Protracted active phase Fetal Wellbeing:  Category I Pain Control:  Epidural Anticipated MOD:  Uncertain Dr. Jolayne Panther updated. Will come talk to family.   [place], CNM 09/02/2014 7:04 PM

## 2014-09-02 NOTE — Progress Notes (Signed)
LABOR PROGRESS NOTE  Misty Little is a 38 y.o. G2P0010 at 4927w2d  ad[facility] for induction of labor [facility], also has sturge-weber.  Subjective: comfortable  Objective: BP 134/51  Pulse 70  Temp(Src) 98 F (36.7 C) (Oral)  Resp 18  Ht 5' 1.4" (1.56 m)  Wt 133.358 kg (294 lb)  BMI 54.80 kg/m2  SpO2 98%  LMP 11/23/2013 or  Filed Vitals:   09/01/14 2310 09/01/14 2331 09/02/14 0001 09/02/14 0030  BP: 144/66 165/83 129/65 134/51  Pulse: 72 75 73 70  Temp:      TempSrc:      Resp: 18 18 18    Height:      Weight:      SpO2:           FHT:  FHR: 150 bpm, variability: moderate,  accelerations:  Present,  decelerations:  Present variables UC:   Unable to trace on toco  SVE:   Dilation: 4 Effacement (%): 80 Station: Ballotable Exam by:: Dr Loreta AveAcosta  Dilation: 4 Effacement (%): 80 Cervical Position: Posterior Station: Ballotable Presentation: Vertex Exam by:: Dr Loreta AveAcosta  Pitocin @ 15 mL/min  Labs: Lab Results  Component Value Date   WBC 10.2 09/01/2014   HGB 11.7* 09/01/2014   HCT 35.2* 09/01/2014   MCV 90.5 09/01/2014   PLT 159 09/01/2014    Assessment / Plan: Induction of labor due to gestational hypertension, on pitocin and mag IUPC placed now  Labor: slow Fetal Wellbeing:  Category II Pain Control:  Epidural Anticipated MOD:  guarded  Tawni CarnesWight, Mckenzi Buonomo, MD 09/02/2014, 12:55 AM

## 2014-09-02 NOTE — Progress Notes (Signed)
This note also relates to the following rows which could not be included: Pulse Rate - Cannot attach notes to unvalidated device data SpO2 - Cannot attach notes to unvalidated device data   Pt uncomfortable with contractions, pca hit x3; rate increased to 1114ml/hr also; RN called Dr Loreta AveAcosta and told of fhr with decelerations, but unable to accurately monitor contractions, asked for an IUPC; IUPC placed, told Dr of fhr tachycardic, pt does not have a fever; provider reviewed strip, no new orders at this time

## 2014-09-02 NOTE — Progress Notes (Signed)
Fentanyl pulled from pyxis by Cammy CopaJennifer Davarious Tumbleson, RN per Dr. Jean RosenthalJackson request.  Dr. Jean RosenthalJackson used fentanyl for epidural redose.  Was not able to scan fentanyl vial, due to placement in sharps.

## 2014-09-02 NOTE — Progress Notes (Signed)
Spoke with Misty KinsmanVirginia Little, CNM about patient update: SVE, pitocin, UOP and discomfort. She asked if I would give Dr. Jolayne Pantheronstant an update.  Dr. Jolayne Pantheronstant updated. Order for 500 cc bolus, continue with pitocin and current plan of care.

## 2014-09-02 NOTE — Progress Notes (Addendum)
Patient very uncomfortable on her side with pressure.  Patient sat on bedpan to try BM, no results. Also, decreased UOP and Misty Little, CNM made aware. Order for 500 cc bolus given.

## 2014-09-02 NOTE — Progress Notes (Signed)
Patient ID: Misty Little, female   DOB: 07/09/1976, 38 y.o.   MRN: [id number] Misty Little is a 38 y.o. G2P0010 at 3523w3d.  Subjective: Uncomfortable w/ Contractions.   Objective: BP 140/58  Pulse 96  Temp(Src) 99.6 F (37.6 C) (Oral)  Resp 28  Ht 5' 1.4" (1.56 m)  Wt 133.358 kg (294 lb)  BMI 54.80 kg/m2  SpO2 99%  LMP 11/23/2013   Temp 100.7 at 0800.  FHT:  FHR: 150 bpm, variability: min-mod,  accelerations:  15x15,  decelerations:  None UC:   Q 6minutes, moderate  Dilation: 6 Effacement (%): 90 Cervical Position: Anterior Station: -1 Presentation: Vertex Exam by:: Misty Little, CNM [place].  Unable to determine position  Labs: Results for orders placed during the hospital encounter of 08/30/14 (from the past 24 hour(s))  CBC     Status: Abnormal   Collection Time    09/01/14 10:20 PM      Result Value Ref Range   WBC 10.2  4.0 - 10.5 K/uL   RBC 3.89  3.87 - 5.11 MIL/uL   Hemoglobin 11.7 (*) 12.0 - 15.0 g/dL   HCT 40.935.2 (*) 81.136.0 - 91.446.0 %   MCV 90.5  78.0 - 100.0 fL   MCH 30.1  26.0 - 34.0 pg   MCHC 33.2  30.0 - 36.0 g/dL   RDW 78.215.9 (*) 95.611.5 - 21.315.5 %   Platelets 159  150 - 400 K/uL  COMPREHENSIVE METABOLIC PANEL     Status: Abnormal   Collection Time    09/01/14 10:20 PM      Result Value Ref Range   Sodium 136 (*) 137 - 147 mEq/L   Potassium 4.0  3.7 - 5.3 mEq/L   Chloride 103  96 - 112 mEq/L   CO2 20  19 - 32 mEq/L   Glucose, Bld 90  70 - 99 mg/dL   BUN 9  6 - 23 mg/dL   Creatinine, Ser 0.860.63  0.50 - 1.10 mg/dL   Calcium 8.2 (*) 8.4 - 10.5 mg/dL   Total Protein 5.6 (*) 6.0 - 8.3 g/dL   Albumin 2.1 (*) 3.5 - 5.2 g/dL   AST 16  0 - 37 U/L   ALT 7  0 - 35 U/L   Alkaline Phosphatase 107  39 - 117 U/L   Total Bilirubin 0.3  0.3 - 1.2 mg/dL   GFR calc non Af Amer >90  >90 mL/min   GFR calc Af Amer >90  >90 mL/min   Anion gap 13  5 - 15  PROTEIN / CREATININE RATIO, URINE     Status: Abnormal   Collection Time    09/01/14 10:45 PM      Result Value Ref Range   Creatinine, Urine 46.69     Total Protein, Urine 27.2     Protein Creatinine Ratio 0.58 (*) 0.00 - 0.15  MAGNESIUM     Status: Abnormal   Collection Time    09/02/14  5:00 AM      Result Value Ref Range   Magnesium 4.1 (*) 1.5 - 2.5 mg/dL  TYPE AND SCREEN     Status: None   Collection Time    09/02/14  7:50 AM      Result Value Ref Range   ABO/RH(D) A NEG     Antibody Screen NEG     Sample Expiration 09/05/2014     Unit Number [id number]     Blood Component Type RED CELLS,LR     Unit division  00     Status of Unit ALLOCATED     Transfusion Status OK TO TRANSFUSE     Crossmatch Result Compatible     Unit Number [id number]     Blood Component Type RED CELLS,LR     Unit division 00     Status of Unit ALLOCATED     Transfusion Status OK TO TRANSFUSE     Crossmatch Result Compatible     Unit Number [id number]     Blood Component Type RED CELLS,LR     Unit division 00     Status of Unit ALLOCATED     Transfusion Status OK TO TRANSFUSE     Crossmatch Result Compatible     Unit Number [id number]     Blood Component Type RED CELLS,LR     Unit division 00     Status of Unit ALLOCATED     Transfusion Status OK TO TRANSFUSE     Crossmatch Result Compatible    PREPARE RBC (CROSSMATCH)     Status: None   Collection Time    09/02/14  8:00 AM      Result Value Ref Range   Order Confirmation ORDER PROCESSED BY BLOOD BANK    MAGNESIUM     Status: Abnormal   Collection Time    09/02/14  8:10 AM      Result Value Ref Range   Magnesium 4.4 (*) 1.5 - 2.5 mg/dL  CBC     Status: Abnormal   Collection Time    09/02/14  8:10 AM      Result Value Ref Range   WBC 12.7 (*) 4.0 - 10.5 K/uL   RBC 4.07  3.87 - 5.11 MIL/uL   Hemoglobin 12.5  12.0 - 15.0 g/dL   HCT 24.4  01.0 - 27.2 %   MCV 90.9  78.0 - 100.0 fL   MCH 30.7  26.0 - 34.0 pg   MCHC 33.8  30.0 - 36.0 g/dL   RDW 53.6 (*) 64.4 - 03.4 %   Platelets 154  150 - 400 K/uL  COMPREHENSIVE METABOLIC PANEL     Status:  Abnormal   Collection Time    09/02/14  8:10 AM      Result Value Ref Range   Sodium 136 (*) 137 - 147 mEq/L   Potassium 3.9  3.7 - 5.3 mEq/L   Chloride 101  96 - 112 mEq/L   CO2 20  19 - 32 mEq/L   Glucose, Bld 92  70 - 99 mg/dL   BUN 9  6 - 23 mg/dL   Creatinine, Ser 7.42  0.50 - 1.10 mg/dL   Calcium 8.3 (*) 8.4 - 10.5 mg/dL   Total Protein 6.2  6.0 - 8.3 g/dL   Albumin 2.2 (*) 3.5 - 5.2 g/dL   AST 19  0 - 37 U/L   ALT 7  0 - 35 U/L   Alkaline Phosphatase 115  39 - 117 U/L   Total Bilirubin 0.3  0.3 - 1.2 mg/dL   GFR calc non Af Amer >90  >90 mL/min   GFR calc Af Amer >90  >90 mL/min   Anion gap 15  5 - 15    Assessment / Plan: [redacted]w[redacted]d week IUP Labor: Early Fetal Wellbeing:  Category I Pain Control:  Epidural (not working) Anticipated MOD:  Unsure. CTO temp, FHR, BP, labor progress closely. May need C/S. Restart pitocin. Redose epidural Dr. Jolayne Panther updated. Agrees w/ POC.   [place], [place] 09/02/2014 10:12  AM    

## 2014-09-02 NOTE — Progress Notes (Addendum)
LABOR PROGRESS NOTE  Misty Little is a 38 y.o. G2P0010 at 8276w2d  [facility], also has sturge-weber.  Subjective: comfortable  Objective: BP 121/57  Pulse 90  Temp(Src) 98.3 F (36.8 C) (Oral)  Resp 18  Ht 5' 1.4" (1.56 m)  Wt 294 lb (133.358 kg)  BMI 54.80 kg/m2  SpO2 99%  LMP 11/23/2013 or  Filed Vitals:   09/02/14 0431 09/02/14 0500 09/02/14 0600 09/02/14 0700  BP: 95/46 100/55 103/46 121/57  Pulse: 91 98 98 90  Temp:   98.3 F (36.8 C)   TempSrc:   Oral   Resp: 18 18 18 18   Height:      Weight:      SpO2:           FHT:  FHR: 130 bpm, variability: minimal,  few accelerations:  Present,  decelerations:  Absent UC:   q4-625min  SVE:   Dilation: 4 Effacement (%): 90 Station: -1 Exam by:: [email or web address],rnc  Dilation: 4 Effacement (%): 90 Cervical Position: Posterior Station: -1 Presentation: Vertex Exam by:: [email or web address],rnc   Labs: Lab Results  Component Value Date   WBC 10.2 09/01/2014   HGB 11.7* 09/01/2014   HCT 35.2* 09/01/2014   MCV 90.5 09/01/2014   PLT 159 09/01/2014    Assessment / Plan: Induction of labor due to gestational hypertension with superimposed preeclampsia  Labor: pitocin break 2/2 recurrent lates around 0500 Fetal Wellbeing:  Cat II, restart pit at 2 as late decels have resolved, cat II 2/2 minimal variability however few accels noted Pain Control:  Epidural Anticipated MOD:  guarded Low blood pressure: mag level 4.1, continue mag 2, phenylephrine if needed  Facial swelling: keep head elevated above bed, position changes as currently laying on right side likely contributing to dependent swelling of right side of face with port wine stain.  Family updated, husband asking if cesarean section would be better, discussed risks and benefits and importance of giving patient adequate time to attempt vaginal delivery.  Perry MountACOSTA,Angelize Ryce ROCIO, MD 09/02/2014, 7:08 AM

## 2014-09-02 NOTE — Progress Notes (Signed)
Pt continues to have pain/feels like she needs to pee all the time, also c/o pressure/pain in buttox/back; pcea hit x3 again, RN checked to make sure foley bulb in the right place; RN called anesthesiologist Dr Renold DonGermeroth to come and dose pt, pt position changed.

## 2014-09-02 NOTE — Op Note (Signed)
Misty Little PROCEDURE DATE: 09/02/2014  PREOPERATIVE DIAGNOSES: Intrauterine pregnancy at 5858w3d weeks gestation; chorioamnionitis, cephalo-pelvic disproportion, failed induction, failure to progress: arrest of dilation and non-reassuring fetal status, morbid obesity.  POSTOPERATIVE DIAGNOSES: The same, malpresentation occiput posterior  PROCEDURE: Primary Low Transverse Cesarean Section  SURGEON:  Dr. Catalina AntiguaPeggy Constant  ASSISTANT:  Dr. Fredirick LatheKristy Sarie Stall  ANESTHESIOLOGIST: Dr. Neale BurlyFreeman  INDICATIONS: Misty Millinerina Macrae is a 38 y.o. G2P1011 at 7158w3d here for cesarean section secondary to the indications listed under preoperative diagnoses; please see preoperative note for further details.  Patient with Body mass index is 54.8 kg/(m^2). (294lb) was admitted for induction of labor at 2621w0d for gestational hypertension noted in clinic.   She underwent 4 day induction with multiple doses of cytotec, foley bulb, AROM, pitocin and progressed to 7-8/80/0 at which time she declined further exam and requested cesarean section.  During labor baby had recurrent lates that resolved with pitocin breaks. The risks of cesarean section were discussed with the patient including but were not limited to: bleeding which may require transfusion or reoperation; infection which may require antibiotics; injury to bowel, bladder, ureters or other surrounding organs; injury to the fetus; need for additional procedures including hysterectomy in the event of a life-threatening hemorrhage; placental abnormalities wth subsequent pregnancies, incisional problems, thromboembolic phenomenon and other postoperative/anesthesia complications.   The patient concurred with the proposed plan, giving informed written consent for the procedure.    FINDINGS:  Viable female infant in cephalic presentation.  Apgars 7 and 9.  Clear amniotic fluid.  Intact placenta, three vessel cord.  Normal uterus, fallopian tubes and ovaries bilaterally.  ANESTHESIA:  Epidural INTRAVENOUS FLUIDS: 3061.7 ml ESTIMATED BLOOD LOSS: 800 ml URINE OUTPUT:  150mL SPECIMENS: Placenta sent to pathology COMPLICATIONS: None immediate  PROCEDURE IN DETAIL:  The patient preoperatively received intravenous antibiotics and had sequential compression devices applied to her lower extremities.  She was then taken to the operating room where the epidural anesthesia was dosed up to surgical level and was found to be adequate. She was then placed in a dorsal supine position with a leftward tilt, and prepped and draped in a sterile manner.  A foley catheter was placed into her bladder and attached to constant gravity.  After an adequate timeout was performed, a Pfannenstiel skin incision was made with scalpel and carried through to the underlying layer of fascia. The fascia was incised in the midline, and this incision was extended bilaterally using the Mayo scissors.  Kocher clamps were applied to the superior aspect of the fascial incision and the underlying rectus muscles were dissected off bluntly. A similar process was carried out on the inferior aspect of the fascial incision. The rectus muscles were separated in the midline bluntly and the peritoneum was entered bluntly. Attention was turned to the lower uterine segment where a low transverse hysterotomy was made with a scalpel and extended bilaterally bluntly, a venous lake was encountered.  The infant was successfully delivered, the cord was clamped and cut and the infant was handed over to awaiting neonatology team. Uterine massage was then administered, and the placenta delivered intact with a three-vessel cord. The uterus was then cleared of clot and debris.  The hysterotomy was closed with 0 Vicryl in a running locked fashion, and an imbricating layer was also placed with 0 Vicryl. The pelvis was cleared of all clot and debris. Hemostasis was confirmed on all surfaces.  The peritoneum were reapproximated using 0 Vicryl interrupted  stitches. Irrigated.  The fascia was  then closed using 0 Vicryl in a running fashion.  The subcutaneous layer was irrigated, then reapproximated with 2-0 plain gut interrupted stitches.  The skin was closed with a 4-0 Vicryl subcuticular stitch. The patient tolerated the procedure well. Sponge, lap, instrument and needle counts were correct x 2.  She was taken to the recovery room in stable condition.    [name], MD OB Fellow [facility], [facility]

## 2014-09-02 NOTE — Transfer of Care (Signed)
Immediate Anesthesia Transfer of Care Note  Patient: Misty Little  Procedure(s) Performed: Procedure(s): CESAREAN SECTION (N/A)  Patient Location: PACU  Anesthesia Type:Epidural  Level of Consciousness: awake  Airway & Oxygen Therapy: Patient Spontanous Breathing  Post-op Assessment: Report given to PACU RN and Post -op Vital signs reviewed and stable  Post vital signs: stable  Complications: No apparent anesthesia complications

## 2014-09-02 NOTE — Progress Notes (Signed)
Called in to see patient who is requesting a cesarean section. Patient without cervical change x 3 hours. Offered to wait an additional hour before deciding on cesarean section. Patient declined. Discussed risks, benefits and alternatives including risks of bleeding, infection and damage to adjacent organs. Patient verbalized understanding and all questions were answered. Consent signed

## 2014-09-02 NOTE — Progress Notes (Signed)
ANTIBIOTIC CONSULT NOTE - INITIAL  Pharmacy Consult for Gentamicin Indication: Maternal temp; R/O chorioamnionitis  Allergies  Allergen Reactions  . Other Other (See Comments)    Pt states that she is allergic to walnuts- Reaction:  Causes tongue to hurt.     Patient Measurements: Height: 5' 1.4" (156 cm) Weight: 294 lb (133.358 kg) IBW/kg (Calculated) : 48.72 Adjusted Body Weight: 74.1 kg  Vital Signs: Temp: 98.3 F (36.8 C) (10/04 0600) Temp Source: Oral (10/04 0600) BP: 121/57 mmHg (10/04 0700) Pulse Rate: 90 (10/04 0700) Intake/Output from previous day: 10/03 0701 - 10/04 0700 In: 1888.8 [P.O.:660; I.V.:1228.8] Out: 2655 [Urine:2655] Intake/Output from this shift:    Labs:  Recent Labs  08/31/14 1610 08/31/14 1800 09/01/14 0505 09/01/14 2220 09/01/14 2245  WBC 11.6*  --  11.6* 10.2  --   HGB 12.7  --  12.4 11.7*  --   PLT 174  --  160 159  --   LABCREA  --  90.68  --   --  46.69  CREATININE 0.52  --   --  0.63  --    Estimated Creatinine Clearance: 124.3 ml/min (by C-G formula based on Cr of 0.63). No results found for this basename: VANCOTROUGH, VANCOPEAK, VANCORANDOM, GENTTROUGH, GENTPEAK, GENTRANDOM, TOBRATROUGH, TOBRAPEAK, TOBRARND, AMIKACINPEAK, AMIKACINTROU, AMIKACIN,  in the last 72 hours   Microbiology: No results found for this or any previous visit (from the past 720 hour(s)).  Medical History: Past Medical History  Diagnosis Date  . Sturge-Weber syndrome with glaucoma   . Infection     uti  . Anemia   . Headache(784.0)   . Seizures     "small ones as child"    Medications:  Ampicillin 2 gram IV q6h Assessment: 38yo F 40+ weeks admitted for IOL due to gestational HTN. Pt started on Ampicillin and Gentamicin for r/o chorioamnionitis.  Goal of Therapy:  Gent peaks 6-578mcg/ml and trough < 731mcg/ml  Plan:  1. Gentamicin 190mg  IV q8h. 2. Will continue to follow and assess need for further kinetic workup. Thanks!  Claybon Jabsngel, Jahziah Simonin  G 09/02/2014,8:08 AM

## 2014-09-02 NOTE — Anesthesia Postprocedure Evaluation (Signed)
  Anesthesia Post-op Note  Patient: Misty Little  Procedure(s) Performed: Procedure(s): CESAREAN SECTION (N/A)  Patient Location: PACU  Anesthesia Type:Epidural  Level of Consciousness: awake, alert  and oriented  Airway and Oxygen Therapy: Patient Spontanous Breathing  Post-op Pain: none  Post-op Assessment: Post-op Vital signs reviewed, Patient's Cardiovascular Status Stable, Respiratory Function Stable, Patent Airway, No signs of Nausea or vomiting, Pain level controlled, No headache and No backache  Post-op Vital Signs: Reviewed and stable  Last Vitals:  Filed Vitals:   09/02/14 2215  BP:   Pulse: 93  Temp:   Resp: 20    Complications: No apparent anesthesia complications

## 2014-09-03 ENCOUNTER — Encounter (HOSPITAL_COMMUNITY): Payer: Self-pay | Admitting: *Deleted

## 2014-09-03 LAB — CBC
HCT: 28.4 % — ABNORMAL LOW (ref 36.0–46.0)
HCT: 28.4 % — ABNORMAL LOW (ref 36.0–46.0)
HEMOGLOBIN: 9.4 g/dL — AB (ref 12.0–15.0)
Hemoglobin: 9.5 g/dL — ABNORMAL LOW (ref 12.0–15.0)
MCH: 29.7 pg (ref 26.0–34.0)
MCH: 30 pg (ref 26.0–34.0)
MCHC: 33.1 g/dL (ref 30.0–36.0)
MCHC: 33.5 g/dL (ref 30.0–36.0)
MCV: 89.6 fL (ref 78.0–100.0)
MCV: 89.6 fL (ref 78.0–100.0)
PLATELETS: 177 10*3/uL (ref 150–400)
PLATELETS: 161 10*3/uL (ref 150–400)
RBC: 3.17 MIL/uL — ABNORMAL LOW (ref 3.87–5.11)
RBC: 3.17 MIL/uL — ABNORMAL LOW (ref 3.87–5.11)
RDW: 15.7 % — ABNORMAL HIGH (ref 11.5–15.5)
RDW: 15.6 % — AB (ref 11.5–15.5)
WBC: 16.1 10*3/uL — ABNORMAL HIGH (ref 4.0–10.5)
WBC: 14.2 10*3/uL — AB (ref 4.0–10.5)

## 2014-09-03 MED ORDER — DIPHENHYDRAMINE HCL 25 MG PO CAPS: 25.0000 mg | ORAL_CAPSULE | Freq: Four times a day (QID) | ORAL | Status: DC | PRN

## 2014-09-03 MED ORDER — PRENATAL MULTIVITAMIN CH
1.0000 | ORAL_TABLET | Freq: Every day | ORAL | Status: DC
  Administered 2014-09-03: 1 via ORAL
  Filled 2014-09-03: qty 1

## 2014-09-03 MED ORDER — IBUPROFEN 600 MG PO TABS
600.0000 mg | ORAL_TABLET | Freq: Four times a day (QID) | ORAL | Status: DC
  Administered 2014-09-03 – 2014-09-05 (×11): 600 mg via ORAL
  Filled 2014-09-03 (×11): qty 1

## 2014-09-03 MED ORDER — OXYTOCIN 40 UNITS IN LACTATED RINGERS INFUSION - SIMPLE MED: 62.5000 mL/h | INTRAVENOUS | Status: AC

## 2014-09-03 MED ORDER — SCOPOLAMINE 1 MG/3DAYS TD PT72: 1.0000 | MEDICATED_PATCH | Freq: Once | TRANSDERMAL | Status: DC

## 2014-09-03 MED ORDER — LANOLIN HYDROUS EX OINT: 1.0000 "application " | TOPICAL_OINTMENT | CUTANEOUS | Status: DC | PRN

## 2014-09-03 MED ORDER — TETANUS-DIPHTH-ACELL PERTUSSIS 5-2.5-18.5 LF-MCG/0.5 IM SUSP
0.5000 mL | Freq: Once | INTRAMUSCULAR | Status: DC
  Filled 2014-09-03: qty 0.5

## 2014-09-03 MED ORDER — ZOLPIDEM TARTRATE 5 MG PO TABS: 5.0000 mg | ORAL_TABLET | Freq: Every evening | ORAL | Status: DC | PRN

## 2014-09-03 MED ORDER — NALBUPHINE HCL 10 MG/ML IJ SOLN
5.0000 mg | INTRAMUSCULAR | Status: DC | PRN
Start: 2014-09-03 — End: 2014-09-05
  Administered 2014-09-03 (×3): 5 mg via INTRAVENOUS
  Filled 2014-09-03 (×3): qty 1

## 2014-09-03 MED ORDER — MENTHOL 3 MG MT LOZG: 1.0000 | LOZENGE | OROMUCOSAL | Status: DC | PRN

## 2014-09-03 MED ORDER — NALBUPHINE HCL 10 MG/ML IJ SOLN: 5.0000 mg | Freq: Once | INTRAMUSCULAR | Status: AC | PRN

## 2014-09-03 MED ORDER — DIPHENHYDRAMINE HCL 25 MG PO CAPS: 25.0000 mg | ORAL_CAPSULE | ORAL | Status: DC | PRN

## 2014-09-03 MED ORDER — DIPHENHYDRAMINE HCL 50 MG/ML IJ SOLN: 12.5000 mg | INTRAMUSCULAR | Status: DC | PRN

## 2014-09-03 MED ORDER — ONDANSETRON HCL 4 MG PO TABS: 4.0000 mg | ORAL_TABLET | ORAL | Status: DC | PRN

## 2014-09-03 MED ORDER — LACTATED RINGERS IV SOLN
INTRAVENOUS | Status: DC
  Administered 2014-09-03 (×2): via INTRAVENOUS

## 2014-09-03 MED ORDER — MAGNESIUM SULFATE 40 G IN LACTATED RINGERS - SIMPLE
2.0000 g/h | INTRAVENOUS | Status: DC
Start: 2014-09-03 — End: 2014-09-03
  Filled 2014-09-03: qty 500

## 2014-09-03 MED ORDER — OXYCODONE-ACETAMINOPHEN 5-325 MG PO TABS
2.0000 | ORAL_TABLET | ORAL | Status: DC | PRN
  Filled 2014-09-03: qty 2

## 2014-09-03 MED ORDER — ONDANSETRON HCL 4 MG/2ML IJ SOLN: 4.0000 mg | Freq: Three times a day (TID) | INTRAMUSCULAR | Status: DC | PRN

## 2014-09-03 MED ORDER — NALOXONE HCL 0.4 MG/ML IJ SOLN: 0.4000 mg | INTRAMUSCULAR | Status: DC | PRN

## 2014-09-03 MED ORDER — SODIUM CHLORIDE 0.9 % IJ SOLN: 3.0000 mL | INTRAMUSCULAR | Status: DC | PRN

## 2014-09-03 MED ORDER — SIMETHICONE 80 MG PO CHEW
80.0000 mg | CHEWABLE_TABLET | Freq: Three times a day (TID) | ORAL | Status: DC
  Administered 2014-09-03 – 2014-09-05 (×7): 80 mg via ORAL
  Filled 2014-09-03 (×7): qty 1

## 2014-09-03 MED ORDER — SIMETHICONE 80 MG PO CHEW
80.0000 mg | CHEWABLE_TABLET | ORAL | Status: DC
  Administered 2014-09-04 – 2014-09-05 (×2): 80 mg via ORAL
  Filled 2014-09-03 (×2): qty 1

## 2014-09-03 MED ORDER — DIBUCAINE 1 % RE OINT: 1.0000 "application " | TOPICAL_OINTMENT | RECTAL | Status: DC | PRN

## 2014-09-03 MED ORDER — NALOXONE HCL 1 MG/ML IJ SOLN: 1.0000 ug/kg/h | INTRAVENOUS | Status: DC | PRN

## 2014-09-03 MED ORDER — ONDANSETRON HCL 4 MG/2ML IJ SOLN: 4.0000 mg | INTRAMUSCULAR | Status: DC | PRN

## 2014-09-03 MED ORDER — WITCH HAZEL-GLYCERIN EX PADS
1.0000 | MEDICATED_PAD | CUTANEOUS | Status: DC | PRN
Start: 2014-09-03 — End: 2014-09-05

## 2014-09-03 MED ORDER — IBUPROFEN 600 MG PO TABS: 600.0000 mg | ORAL_TABLET | Freq: Four times a day (QID) | ORAL | Status: DC

## 2014-09-03 MED ORDER — NALBUPHINE HCL 10 MG/ML IJ SOLN: 5.0000 mg | INTRAMUSCULAR | Status: DC | PRN

## 2014-09-03 MED ORDER — SENNOSIDES-DOCUSATE SODIUM 8.6-50 MG PO TABS
2.0000 | ORAL_TABLET | ORAL | Status: DC
  Administered 2014-09-04 – 2014-09-05 (×2): 2 via ORAL
  Filled 2014-09-03 (×2): qty 2

## 2014-09-03 MED ORDER — SIMETHICONE 80 MG PO CHEW
80.0000 mg | CHEWABLE_TABLET | ORAL | Status: DC | PRN
  Filled 2014-09-03: qty 1

## 2014-09-03 MED ORDER — OXYCODONE-ACETAMINOPHEN 5-325 MG PO TABS
1.0000 | ORAL_TABLET | ORAL | Status: DC | PRN
  Administered 2014-09-04 – 2014-09-05 (×6): 1 via ORAL
  Filled 2014-09-03 (×6): qty 1

## 2014-09-03 NOTE — Op Note (Signed)
I was present for the entire length of this procedure and agree with dictated operative note

## 2014-09-03 NOTE — Addendum Note (Signed)
Addendum created 09/03/14 0815 by Algis GreenhouseLinda A Chalsea Darko, CRNA   Modules edited: Notes Section   Notes Section:  File: [id number]

## 2014-09-03 NOTE — Anesthesia Postprocedure Evaluation (Signed)
Anesthesia Post Note  Patient: Misty Little  Procedure(s) Performed: Procedure(s) (LRB): CESAREAN SECTION (N/A)  Anesthesia type: Epidural  Patient location: AICU  Post pain: Pain level controlled  Post assessment: Post-op Vital signs reviewed  Last Vitals:  Filed Vitals:   09/03/14 0800  BP: 113/53  Pulse: 85  Temp:   Resp:     Post vital signs: Reviewed  Level of consciousness:alert  Complications: No apparent anesthesia complications

## 2014-09-03 NOTE — Lactation Note (Signed)
This note was copied from the chart of Misty Little. Lactation Consultation Note New mom has generalized edema. Noted edema to breast, large and heavy. Everted nipples but are tick areolas d/t edema, reverse pressure. Hand pump given to evert nipples more and for stimulation. Discussed positions, mom has large abd. And encouraged football hold. Baby fussy but wouldn't suck. Has bruising marks to head, has recessed chin.  Patient Name: Misty Merleen Millinerina Pezzullo Mom encouraged to feed baby 8-12 times/24 hours and with feeding cues. Mom encouraged to waken baby for feeds. Mom encouraged to do skin-to-skin. Educated about newborn behavior. WH/LC brochure given w/resources, support groups and LC services.Reviewed Baby & Me book's Breastfeeding Basics. Discussed importance of proper latch and depth. Mom feels like because the baby is on nipple that is a good latch. Stressed importance of nipple trauma prevention and getting a good milk transfer and sometimes edema can prevent a deep latch. Mom wasn't open to this at this time.  Today's Date: 09/03/2014 Reason for consult: Initial assessment   Maternal Data Has patient been taught Hand Expression?: Yes Does the patient have breastfeeding experience prior to this delivery?: No  Feeding Feeding Type: Breast Fed Length of feed: 3 min  LATCH Score/Interventions Latch: Too sleepy or reluctant, no latch achieved, no sucking elicited. Intervention(s): Skin to skin;Teach feeding cues;Waking techniques  Audible Swallowing: None  Type of Nipple: Everted at rest and after stimulation (edema to breast)  Comfort (Breast/Nipple): Soft / non-tender  Interventions (Mild/moderate discomfort): Reverse pressue;Hand expression;Hand massage;Pre-pump if needed  Hold (Positioning): Assistance needed to correctly position infant at breast and maintain latch. Intervention(s): Breastfeeding basics reviewed;Support Pillows;Position options;Skin to skin  LATCH Score: 5  Lactation  Tools Discussed/Used Tools: Pump Breast pump type: Manual Pump Review: Setup, frequency, and cleaning;Milk Storage Initiated by:: Peri JeffersonL. Francine Hannan RN Date initiated:: 09/03/14   Consult Status Consult Status: Follow-up Date: 09/03/14 Follow-up type: In-patient    Melaya Hoselton, Diamond NickelLAURA G 09/03/2014, 3:54 AM

## 2014-09-03 NOTE — Plan of Care (Signed)
Problem: Phase I Progression Outcomes Goal: Foley catheter patent Outcome: Completed/Met Date Met:  09/03/14 Draining clear amber urine     

## 2014-09-03 NOTE — Progress Notes (Signed)
Subjective: Postpartum Day 1: Cesarean Delivery Patient reports feeling well. She denies chest pain, SOB, lightheadedness/dizziness, nausea/emesis. HA, RUQ/epigastric pain    Objective: Vital signs in last 24 hours: Temp:  [97.9 F (36.6 C)-100.7 F (38.2 C)] 98.4 F (36.9 C) (10/05 0400) Pulse Rate:  [84-125] 92 (10/05 0700) Resp:  [16-28] 18 (10/05 0700) BP: (102-171)/(40-81) 115/63 mmHg (10/05 0700) SpO2:  [91 %-98 %] 94 % (10/05 0700) Weight:  [304 lb 4.8 oz (138.03 kg)] 304 lb 4.8 oz (138.03 kg) (10/05 0040)  Physical Exam:  General: alert, cooperative and no distress Lochia: appropriate Uterine Fundus: firm, NT Incision: dressing: clean/dry and intact. PICO wound vac in place DVT Evaluation: No evidence of DVT seen on physical exam. Negative Homan's sign. No cords or calf tenderness.   Recent Labs  09/02/14 2155 09/03/14 0603  HGB 9.9* 9.4*  HCT 30.3* 28.4*    Assessment/Plan: Status post Cesarean section. Doing well postoperatively.  Continue magnesium sulfate until 24 hr post delivery for seizure prophylaxis Continue current post partum care.  Mariann Palo 09/03/2014, 7:54 AM

## 2014-09-03 NOTE — Progress Notes (Signed)
UR chart review completed.  

## 2014-09-03 NOTE — Lactation Note (Signed)
This note was copied from the chart of Boy Merleen Millinerina Vanhecke. Lactation Consultation Note  Assisted with suck training on gloved finger and baby did develop a good suck,  Baby appears to have a short frenulum which may effect elevation of the tongue.  Baby remains very sleepy.  Colostrum easily expressed.  24 mm nipple shield used after several attempts.  Baby did sleepily latch and sucked actively for 6 minutes.  Demonstrated waking techniques and breast massage to increase flow and intake.  Encouraged mom do do a lot of skin to skin nuzzling at breast.  Encouraged to call for assist/concerns prn.  Patient Name: Boy Merleen Millinerina Burck ZOXWR'UToday's Date: 09/03/2014 Reason for consult: Follow-up assessment   Maternal Data    Feeding Feeding Type: Breast Fed Length of feed: 6 min  LATCH Score/Interventions Latch: Repeated attempts needed to sustain latch, nipple held in mouth throughout feeding, stimulation needed to elicit sucking reflex. (WITH 24 MM NIPPLE SHIELD) Intervention(s): Skin to skin;Teach feeding cues;Waking techniques Intervention(s): Breast compression;Breast massage;Assist with latch;Adjust position  Audible Swallowing: A few with stimulation  Type of Nipple: Everted at rest and after stimulation  Comfort (Breast/Nipple): Soft / non-tender     Hold (Positioning): Assistance needed to correctly position infant at breast and maintain latch. Intervention(s): Breastfeeding basics reviewed;Support Pillows;Position options;Skin to skin  LATCH Score: 7  Lactation Tools Discussed/Used     Consult Status Consult Status: Follow-up Date: 09/04/14 Follow-up type: In-patient    Huston FoleyMOULDEN, Skylar Priest S 09/03/2014, 4:30 PM

## 2014-09-03 NOTE — Lactation Note (Signed)
This note was copied from the chart of [name]. Lactation Consultation Note Lactation Consultation Note Initial visit done. Mom had failed induction followed by C/S after 4 days. Mom teary eyed and expressing sadness she was not able to have a vaginal delivery. Listened to mom's disappointment and encouraged her to talk about it as it can be part of a grief process. Baby is 7814 hours old and has latched once. Mom has erect nipples with slight areolar edema. Colostrum easily expressed into baby's mouth. Several attempts made to latch baby to breast but baby not able to sustain latch. Baby crying but not showing any other feeding cues. 24 mm nipple shield used and baby held nipple in his mouth without sucking. Baby falls asleep easily. Reassured mom that we will continue to follow. Normal newborn behavior reviewed.     Patient Name: Boy Misty Little JYNWG'NToday's Date: 09/03/2014     Maternal Data    Feeding Feeding Type: Breast Fed  LATCH Score/Interventions                      Lactation Tools Discussed/Used     Consult Status      Huston FoleyMOULDEN, Ritu Gagliardo S 09/03/2014, 10:52 AM

## 2014-09-04 ENCOUNTER — Encounter (HOSPITAL_COMMUNITY): Payer: Self-pay | Admitting: Obstetrics and Gynecology

## 2014-09-04 MED ORDER — FUROSEMIDE 20 MG PO TABS
20.0000 mg | ORAL_TABLET | Freq: Once | ORAL | Status: AC
  Administered 2014-09-04: 20 mg via ORAL
  Filled 2014-09-04: qty 1

## 2014-09-04 MED ORDER — CALCIUM CARBONATE ANTACID 500 MG PO CHEW: 2.0000 | CHEWABLE_TABLET | Freq: Three times a day (TID) | ORAL | Status: DC | PRN

## 2014-09-04 MED ORDER — PRENATAL MULTIVITAMIN CH
1.0000 | ORAL_TABLET | Freq: Every day | ORAL | Status: DC
  Administered 2014-09-04 – 2014-09-05 (×2): 1 via ORAL
  Filled 2014-09-04 (×2): qty 1

## 2014-09-04 MED ORDER — PROMETHAZINE HCL 25 MG PO TABS
12.5000 mg | ORAL_TABLET | Freq: Four times a day (QID) | ORAL | Status: DC | PRN
Start: 2014-09-04 — End: 2014-09-05

## 2014-09-04 NOTE — Progress Notes (Signed)
Patient requested to see Lactation, Victorino DikeJennifer (lactation)RN notified stated will see patient as soon as possible.

## 2014-09-04 NOTE — Lactation Note (Signed)
This note was copied from the chart of Misty Little. Lactation Consultation Note  Patient Name: Misty Merleen Millinerina Porro JXBJY'NToday's Date: 09/04/2014 Reason for consult: Follow-up assessment Called by RN to assist mother with feedings. RN had been helping mother and baby with feeding but baby was not latching and mostly crying. Parents report baby did not feed well last night and only feeds with LC help. Baby tongue thrusts with initial efforts to latch causing difficulty for baby to maintain latch. With breast compression and holding the nipple in baby's mouth tilted towards the palate, baby was able to latch and feed for 35 minutes. Mother was taught and assisted with this technique. Baby would fall asleep at times but with gentle stimulation would regain feeding pattern. Audible swallows noted intermittently. Mother has had breast changes during pregnancy, colostrum easily expressed by hand and teaching reinforced with mother, baby is voiding and stooling well, and mother wants to exclusively breast feed. Pumping initiated for additional stimulation and parents to feed any expressed milk to baby by spoon. Instructed mother to pump q 3 hours following feedings for 15 minutes in the preemie setting. Father called insurance company to arrange for personal pump for home. Baby woke up again after being placed in the crib, crying and rooting. Spoon fed 2 ml of colostrum and then back to breast, mother was able to latch more independently, and baby fed well for 10 more minutes until falling asleep. Mother prefers to feed baby in the chair using her boppy pillow. Advised to have husband close by to risk of falling asleep and baby falling. During this feeding mother was alert and dad was very involved in the feeding and teaching.RN updated on plan of care and progress with feedings. LC to follow as needed. Parents to also call nurse for assist as needed.  Maternal Data Formula Feeding for Exclusion: No Has patient been taught  Hand Expression?: Yes  Feeding Feeding Type: Breast Fed Length of feed: 45 min  LATCH Score/Interventions Latch: Grasps breast easily, tongue down, lips flanged, rhythmical sucking. (with lactation assist, tongue thrusting) Intervention(s): Skin to skin  Audible Swallowing: A few with stimulation Intervention(s): Skin to skin;Hand expression;Alternate breast massage  Type of Nipple: Everted at rest and after stimulation  Comfort (Breast/Nipple): Soft / non-tender (right nipple slightly bruised, tissue is firm)     Hold (Positioning): Assistance needed to correctly position infant at breast and maintain latch. (to assist with depth on the breast) Intervention(s): Breastfeeding basics reviewed  LATCH Score: 8  Lactation Tools Discussed/Used Breast pump type: Double-Electric Breast Pump Pump Review: Setup, frequency, and cleaning Initiated by:: BDaly, RN Date initiated:: 09/05/14   Consult Status Consult Status: Follow-up Date: 09/05/14 Follow-up type: In-patient    Christella HartiganDaly, Matthewjames Petrasek M 09/04/2014, 11:00 AM

## 2014-09-04 NOTE — Progress Notes (Signed)
Post Partum Day 2 Subjective:  Misty Little is a 38 y.o. G2P1011 8016w3d s/p pLTCS 2/2 failed induction, preeclampsia.  No acute events overnight.  Pt denies problems with ambulating, voiding or po intake.  She denies nausea or vomiting.  Pain is poorly controlled.  She has had flatus. She has not had bowel movement.    Objective: Blood pressure 110/61, pulse 100, temperature 99.2 F (37.3 C), temperature source Oral, resp. rate 20, height 5\' 1"  (1.549 m), weight 304 lb 4.8 oz (138.03 kg), last menstrual period 11/23/2013, SpO2 94.00%, unknown if currently breastfeeding.  Physical Exam:  General: alert, cooperative and no distress Lochia:normal flow Chest: CTAB Heart: RRR no m/r/g Abdomen: +BS, soft, nontender,  Uterine Fundus: difficult to palpate 2/2 morbid obesity, appropriately tender DVT Evaluation: No evidence of DVT seen on physical exam. Extremities: 2+ edema, symmetrical, facial edema much improved   Recent Labs  09/03/14 0603 09/03/14 0952  HGB 9.4* 9.5*  HCT 28.4* 28.4*    Assessment/Plan:  ASSESSMENT: Misty Little is a 38 y.o. G2P1011 7016w3d s/p pLTCS POD#2 - discharge tomorrow - lasix 20mg  po x1 2/2 LE edema   LOS: 5 days   Yurani Fettes ROCIO 09/04/2014, 3:07 PM

## 2014-09-05 LAB — TYPE AND SCREEN
ABO/RH(D): A NEG
ANTIBODY SCREEN: NEGATIVE
UNIT DIVISION: 0
UNIT DIVISION: 0
Unit division: 0
Unit division: 0

## 2014-09-05 MED ORDER — IBUPROFEN 600 MG PO TABS: 600.0000 mg | ORAL_TABLET | Freq: Four times a day (QID) | ORAL | Status: DC

## 2014-09-05 MED ORDER — OXYCODONE-ACETAMINOPHEN 5-325 MG PO TABS: 1.0000 | ORAL_TABLET | ORAL | Status: DC | PRN

## 2014-09-05 NOTE — Progress Notes (Signed)
Discharge instructions reviewed with patient.  Patient states understanding of home care, medications, activity, signs/symptoms to report to MD and return MD office visit.  Patients significant other will assist with her care @ home and no home equipment needed.  Patient discharged in stable condition without incident.

## 2014-09-05 NOTE — Discharge Instructions (Signed)

## 2014-09-05 NOTE — Lactation Note (Signed)
This note was copied from the chart of Misty Little. Lactation Consultation Note  Follow up visit done.  Baby continues to have difficulty latching.  Mom is pumping every 3 hours and obtaining small amounts of colostrum.  Parents are giving formula per bottle.  Volume parameters reviewed.  Parents plan on discussing short frenulum with pediatrician.  FOB had his clipped when he was 38 years old due to speech difficulty.  FOB [place].  Plan is for mom to continue pumping to establish and maintain milk supply, bottle feed baby formula/EBM per volume guidelines and call for outpatient appointment once milk volume increases and tongue tie evaluated.  Mom will obtain a breast pump from insurance company.  LC phone number left with parents if they desire latch assist prior to discharge.    Patient Name: Misty Little AVWUJ'WToday's Date: 09/05/2014     Maternal Data Formula Feeding for Exclusion: No (patient requesting formula) Has patient been taught Hand Expression?: Yes Does the patient have breastfeeding experience prior to this delivery?: No  Feeding Feeding Type: Formula Nipple Type: Slow - flow  LATCH Score/Interventions                      Lactation Tools Discussed/Used     Consult Status      Huston FoleyMOULDEN, Gisel Vipond S 09/05/2014, 9:35 AM

## 2014-09-05 NOTE — Progress Notes (Signed)
Patient requested baby to have formula.  Similac advance care given 10cc, tolerated well, no spitting.

## 2014-09-05 NOTE — Progress Notes (Signed)
Patient requested formula this am to give baby in bottle.  Instructions on bottle feeding and breast feeding with advantages and disadvantage given.

## 2014-09-05 NOTE — Progress Notes (Signed)
Patient and significant other requested formula to feed baby.

## 2014-09-05 NOTE — Lactation Note (Signed)
This note was copied from the chart of Misty Merleen Millinerina [place]. Lactation Consultation Note  Parents decided to rent a pump for 2 weeks.  Rental completed.  Encouraged to call with concerns prn.  Patient Name: Misty Little ZOXWR'UToday's Date: 09/05/2014     Maternal Data Formula Feeding for Exclusion: No (patient requesting formula) Has patient been taught Hand Expression?: Yes Does the patient have breastfeeding experience prior to this delivery?: No  Feeding Feeding Type: Formula Nipple Type: Slow - flow  LATCH Score/Interventions                      Lactation Tools Discussed/Used     Consult Status      Rock NephewMOULDEN, Roselia Snipe S 09/05/2014, 12:00 PM

## 2014-09-05 NOTE — Lactation Note (Addendum)
This note was copied from the chart of Boy Merleen Millinerina Mapp. Lactation Consultation Note  Patient Name: Boy Merleen Millinerina Ureta GNFAO'ZToday's Date: 09/05/2014 Reason for consult: Follow-up assessment  Baby 51 hours of life. Patient's RN called for assistance, mom asking to see LC. Mom states that she has not been able to latch baby at breast. Mom very discouraged because other have been able to latch baby, but she is not able to latch baby herself. Mom concerned because she is being discharged tomorrow and she cannot nurse baby on her on. Began assisting mom to latch in football position to left breast. Mom very exhausted. LC noticed baby not able to extend tongue across gumline. Showed parents baby's tongue and tight frenulum. FOB stated that he had to have frenotomy as a child. Enc parents to discuss with pediatrician. Mom states that she would rather pump than latch baby at this time. Enc mom to pump and give baby EBM with spoon at this feeding. Enc mom to offer breast first at each feeding, then supplement with EBM, and post-pump after each feeding for supplementing at next feeding. Enc mom to pump at least every 3 hours and more often as able in order to have enough for baby and protect supply. Mom states she has pumped twice today and baby has only at breast and latched well a few times, but was spoon-fed EBM. Discussed supply and demand and need to stimulate breast regularly. Enc mom to pump more often rather than longer at each pumping. Discussed need to supplement with formula if not obtaining enough EBM to satisfy baby. Enc mom to feed with cues. Parents declined SNS, preferring to spoon-feed now and bottle-feed later as supply increases. FOB working on getting a DEBP pump through [employer], needs doctor's prescription. Discussed hospital rental if needed. Enc parents to call for assistance as needed. Parents aware of OP appointments and LC phone line services. Enc mom to call for an appointment to help latch baby  as needed and for any questions she might have after discharge.  Maternal Data Does the patient have breastfeeding experience prior to this delivery?: No  Feeding Feeding Type: Breast Fed  LATCH Score/Interventions Latch: Repeated attempts needed to sustain latch, nipple held in mouth throughout feeding, stimulation needed to elicit sucking reflex. Intervention(s): Skin to skin Intervention(s): Adjust position;Assist with latch  Audible Swallowing: None Intervention(s): Skin to skin Intervention(s): Skin to skin  Type of Nipple: Everted at rest and after stimulation Intervention(s): No intervention needed  Comfort (Breast/Nipple): Soft / non-tender     Hold (Positioning): Full assist, staff holds infant at breast Intervention(s): Support Pillows;Position options;Breastfeeding basics reviewed  LATCH Score: 5  Lactation Tools Discussed/Used     Consult Status Consult Status: PRN    Geralynn OchsWILLIARD, Deandre Stansel 09/05/2014, 12:09 AM

## 2014-09-05 NOTE — Discharge Summary (Signed)
Obstetric Discharge Summary Reason for Admission: induction of labor for gestational hypertension Prenatal Procedures: Developed super imposed preeclampsia Intrapartum Procedures: cesarean: low cervical, transverse secondary to failure of progression Postpartum Procedures: none Complications-Operative and Postpartum: none  Hospital Course:   Operative Note PROCEDURE DATE: 09/02/2014  PREOPERATIVE DIAGNOSES: Intrauterine pregnancy at [redacted]w[redacted]d weeks gestation; chorioamnionitis, cephalo-pelvic disproportion, failed induction, failure to progress: arrest of dilation and non-reassuring fetal status, morbid obesity.  POSTOPERATIVE DIAGNOSES: The same, malpresentation occiput posterior  PROCEDURE: Primary Low Transverse Cesarean Section  SURGEON: Dr. Catalina Antigua  ASSISTANT: Dr. Fredirick Lathe  ANESTHESIOLOGIST: Dr. Neale Burly  INDICATIONS: Misty Little Little is a 38 y.o. G2P1011 at [redacted]w[redacted]d here for cesarean section secondary to the indications listed under preoperative diagnoses; please see preoperative note for further details. Patient with Body mass index is 54.8 kg/(m^2). (294lb) was admitted for induction of labor at [redacted]w[redacted]d for gestational hypertension noted in clinic. She underwent 4 day induction with multiple doses of cytotec, foley bulb, AROM, pitocin and progressed to 7-8/80/0 at which time she declined further exam and requested cesarean section. During labor baby had recurrent lates that resolved with pitocin breaks.   The risks of cesarean section were discussed with the patient including but were not limited to: bleeding which may require transfusion or reoperation; infection which may require antibiotics; injury to bowel, bladder, ureters or other surrounding organs; injury to the fetus; need for additional procedures including hysterectomy in the event of a life-threatening hemorrhage; placental abnormalities wth subsequent pregnancies, incisional problems, thromboembolic phenomenon and other  postoperative/anesthesia complications. The patient concurred with the proposed plan, giving informed written consent for the procedure.   FINDINGS: Viable female infant in cephalic presentation. Apgars 7 and 9. Clear amniotic fluid. Intact placenta, three vessel cord. Normal uterus, fallopian tubes and ovaries bilaterally.   ANESTHESIA: Epidural  INTRAVENOUS FLUIDS: 3061.7 ml  ESTIMATED BLOOD LOSS: 800 ml  URINE OUTPUT:  SPECIMENS: Placenta sent to pathology  COMPLICATIONS: None immediate   PROCEDURE IN DETAIL: The patient preoperatively received intravenous antibiotics and had sequential compression devices applied to her lower extremities. She was then taken to the operating room where the epidural anesthesia was dosed up to surgical level and was found to be adequate. She was then placed in a dorsal supine position with a leftward tilt, and prepped and draped in a sterile manner. A foley catheter was placed into her bladder and attached to constant gravity. After an adequate timeout was performed, a Pfannenstiel skin incision was made with scalpel and carried through to the underlying layer of fascia. The fascia was incised in the midline, and this incision was extended bilaterally using the Mayo scissors. Kocher clamps were applied to the superior aspect of the fascial incision and the underlying rectus muscles were dissected off bluntly. A similar process was carried out on the inferior aspect of the fascial incision. The rectus muscles were separated in the midline bluntly and the peritoneum was entered bluntly. Attention was turned to the lower uterine segment where a low transverse hysterotomy was made with a scalpel and extended bilaterally bluntly, a venous lake was encountered. The infant was successfully delivered, the cord was clamped and cut and the infant was handed over to awaiting neonatology team. Uterine massage was then administered, and the placenta delivered intact with a  three-vessel cord. The uterus was then cleared of clot and debris. The hysterotomy was closed with 0 Vicryl in a running locked fashion, and an imbricating layer was also placed with 0 Vicryl. The pelvis  was cleared of all clot and debris. Hemostasis was confirmed on all surfaces. The peritoneum were reapproximated using 0 Vicryl interrupted stitches. Irrigated. The fascia was then closed using 0 Vicryl in a running fashion. The subcutaneous layer was irrigated, then reapproximated with 2-0 plain gut interrupted stitches. The skin was closed with a 4-0 Vicryl subcuticular stitch. The patient tolerated the procedure well. Sponge, lap, instrument and needle counts were correct x 2. She was taken to the recovery room in stable condition.   [name], MD  OB Fellow  Faculty Practice, [facility]      H/H: Lab Results  Component Value Date/Time   HGB 9.5* 09/03/2014  9:52 AM   HGB 13.5 01/02/2014   HCT 28.4* 09/03/2014  9:52 AM   HCT 40 01/02/2014    Filed Vitals:   09/05/14 0640  BP: 142/75  Pulse: 99  Temp: 97.4 F (36.3 C)  Resp: 22    Physical Exam: VSS NAD Abd: Appropriately tender, ND, Fundus firm Incision: clean. Dressing dry and intact No c/c/e, Neg homan's sign, neg cords Lochia Appropriate  Discharge Diagnoses: Term Pregnancy-delivered and Preelampsia  Discharge Information: Date: 06/11/2011 Activity: pelvic rest Diet: routine  Medications: PNV, Ibuprofen and Percocet Breast feeding:  Yes Condition: stable Instructions: refer to handout Discharge to: home      Medication List         calcium carbonate 500 MG chewable tablet  Commonly known as:  TUMS - dosed in mg elemental calcium  Chew 2 tablets by mouth 3 (three) times daily as needed for indigestion or heartburn.     ibuprofen 600 MG tablet  Commonly known as:  ADVIL,MOTRIN  Take 1 tablet (600 mg total) by mouth every 6 (six) hours.     oxyCODONE-acetaminophen 5-325 MG per  tablet  Commonly known as:  PERCOCET/ROXICET  Take 1 tablet by mouth every 4 (four) hours as needed (for pain scale less than 7).     prenatal multivitamin Tabs tablet  Take 1 tablet by mouth daily at 12 noon.     promethazine 25 MG tablet  Commonly known as:  PHENERGAN  Take 12.5 mg by mouth every 6 (six) hours as needed for nausea or vomiting.       Follow-up Information   Follow up with [facility] Schedule an appointment as soon as possible for a visit in 6 weeks. (for follow up)    Specialty:  Obstetrics and Gynecology   Contact information:   [address] [place] [address] [phone]      Misty Little Little 09/05/2014,9:31 AM     OB fellow attestation Post Partum Day 3 I have seen and examined this patient and agree with above documentation in the resident's note.   Misty Little Little is a 38 y.o. G2P1011 s/p pLTCS 2/2 preeclampsia, failure to progress, malpresentation.  Pt denies problems with ambulating, voiding or po intake. Pain is well controlled.  Method of Feeding: breast feeding  PE:  BP 146/60  Pulse 100  Temp(Src) 98 F (36.7 C) (Oral)  Resp 20  Ht 5\' 1"  (1.549 m)  Wt 323 lb 8 oz (146.739 kg)  BMI 61.16 kg/m2  SpO2 99%  LMP 11/23/2013  Breastfeeding? Unknown Fundus firm  Plan for discharge: today, blood pressures well controlled without medications, asymptomatic.  Concerned that she needs wound care and home health.  Advised will make appointment for patient for 1 week follow up in clinic as does not live in  [place] and baby love nurse will not be able to visit.  Perry MountACOSTA,Betania Dizon ROCIO, MD 9:32 AM

## 2014-09-06 ENCOUNTER — Telehealth: Payer: Self-pay | Admitting: *Deleted

## 2014-09-06 ENCOUNTER — Ambulatory Visit (INDEPENDENT_AMBULATORY_CARE_PROVIDER_SITE_OTHER): Payer: BC Managed Care – PPO | Admitting: Obstetrics & Gynecology

## 2014-09-06 ENCOUNTER — Encounter: Payer: BC Managed Care – PPO | Admitting: Obstetrics & Gynecology

## 2014-09-06 VITALS — BP 127/72 | HR 72 | Temp 98.3°F

## 2014-09-06 DIAGNOSIS — Z98891 History of uterine scar from previous surgery: Secondary | ICD-10-CM | POA: Insufficient documentation

## 2014-09-06 DIAGNOSIS — Z9889 Other specified postprocedural states: Secondary | ICD-10-CM

## 2014-09-06 NOTE — Patient Instructions (Signed)
Return to clinic for any scheduled appointments or for any gynecologic concerns as needed.   

## 2014-09-06 NOTE — Telephone Encounter (Signed)
Cassie from [employer] [facility] [employer] [facility] [employer] [facility] called requesting verification of when Misty Little delivered claim #[id number]. Per chart review do not see signed release authorizing me to release this information. Called [employer] and notified them we need a signed request of information- they will send to our fax number.

## 2014-09-06 NOTE — Telephone Encounter (Signed)
Patient called and stated that her wound vac is flashing a light that says low suction. I spoke with Dr. Macon LargeAnyanwu about this and she stated that patient would need to come in to have it evaluated. Patient informed. She stated that she can come around 230. Patient put on schedule. Dr. Macon LargeAnyanwu also advised that patient could try retaping the dressing to see if this helped. Patient stated that she will try it and call us if she doesn't need appointment this afternoon.

## 2014-09-06 NOTE — Progress Notes (Signed)
   CLINIC ENCOUNTER NOTE  History:  38 y.o. G2P1011 here today for wound check after undergoing cesarean section on 09/02/14.  Patient had PICO dressing placed; she reports that the machine was saying that there was inadequate suction.  She tried to troubleshoot it at home but was not successful. She is here now to change the dressing. Accompanied by her husband. No acute concerns.  The following portions of the patient's history were reviewed and updated as appropriate: allergies, current medications, past family history, past medical history, past social history, past surgical history and problem list.    Review of Systems:  Pertinent items are noted in HPI.  Objective:  Physical Exam BP 127/72  Pulse 72  Temp(Src) 98.3 F (36.8 C)  LMP 11/23/2013 Gen: NAD Abd: Soft, obese, nontender and nondistended Incision: PICO dressing removed.  Incision noted to be C/D/I, no erythema, no induration.  Incision area cleaned, benzoin placed around incision. New PICO dressing (obtained from OR) placed and secured with provided tapes around the edges.  Machine activation, good suction noted. Extra dressing that came in pack given to patient.   Assessment & Plan:  PICO replaced; patient to remove in 4 days She will follow up for scheduled postpartum visit or earlier as needed.  Wound complication precautions advised. Routine preventative health maintenance measures emphasized.   Jaynie CollinsUGONNA  Tamsen Reist, MD, FACOG Attending Obstetrician & Gynecologist Center for [facility], [facility]

## 2014-09-07 ENCOUNTER — Ambulatory Visit: Payer: Self-pay | Admitting: Family Medicine

## 2014-09-07 ENCOUNTER — Inpatient Hospital Stay (HOSPITAL_COMMUNITY)
Admission: AD | Admit: 2014-09-07 | Discharge: 2014-09-09 | DRG: 776 | Disposition: A | Discharge status: Home / Self Care | Payer: BC Managed Care – PPO | Source: Ambulatory Visit | Attending: Family Medicine | Admitting: Family Medicine

## 2014-09-07 ENCOUNTER — Encounter (HOSPITAL_COMMUNITY): Payer: Self-pay | Admitting: *Deleted

## 2014-09-07 ENCOUNTER — Telehealth: Payer: Self-pay | Admitting: *Deleted

## 2014-09-07 DIAGNOSIS — O9903 Anemia complicating the puerperium: Secondary | ICD-10-CM | POA: Diagnosis present

## 2014-09-07 DIAGNOSIS — O864 Pyrexia of unknown origin following delivery: Secondary | ICD-10-CM | POA: Diagnosis present

## 2014-09-07 DIAGNOSIS — Z98891 History of uterine scar from previous surgery: Secondary | ICD-10-CM

## 2014-09-07 DIAGNOSIS — R0602 Shortness of breath: Secondary | ICD-10-CM

## 2014-09-07 DIAGNOSIS — D509 Iron deficiency anemia, unspecified: Secondary | ICD-10-CM | POA: Diagnosis present

## 2014-09-07 DIAGNOSIS — Z833 Family history of diabetes mellitus: Secondary | ICD-10-CM | POA: Diagnosis not present

## 2014-09-07 DIAGNOSIS — O152 Eclampsia in the puerperium: Secondary | ICD-10-CM | POA: Diagnosis not present

## 2014-09-07 DIAGNOSIS — Z8249 Family history of ischemic heart disease and other diseases of the circulatory system: Secondary | ICD-10-CM

## 2014-09-07 DIAGNOSIS — R5082 Postprocedural fever: Secondary | ICD-10-CM | POA: Diagnosis not present

## 2014-09-07 DIAGNOSIS — O1425 HELLP syndrome, complicating the puerperium: Secondary | ICD-10-CM | POA: Diagnosis present

## 2014-09-07 LAB — CBC
HCT: 24.2 % — ABNORMAL LOW (ref 36.0–46.0)
Hemoglobin: 7.9 g/dL — ABNORMAL LOW (ref 12.0–15.0)
MCH: 29.8 pg (ref 26.0–34.0)
MCHC: 32.6 g/dL (ref 30.0–36.0)
MCV: 91.3 fL (ref 78.0–100.0)
Platelets: 225 10*3/uL (ref 150–400)
RBC: 2.65 MIL/uL — AB (ref 3.87–5.11)
RDW: 15.6 % — AB (ref 11.5–15.5)
WBC: 8 10*3/uL (ref 4.0–10.5)

## 2014-09-07 LAB — COMPREHENSIVE METABOLIC PANEL
ALT: 51 U/L — ABNORMAL HIGH (ref 0–35)
ANION GAP: 13 (ref 5–15)
AST: 102 U/L — ABNORMAL HIGH (ref 0–37)
Albumin: 2.2 g/dL — ABNORMAL LOW (ref 3.5–5.2)
Alkaline Phosphatase: 239 U/L — ABNORMAL HIGH (ref 39–117)
BUN: 14 mg/dL (ref 6–23)
CO2: 23 meq/L (ref 19–32)
CREATININE: 0.8 mg/dL (ref 0.50–1.10)
Calcium: 8.7 mg/dL (ref 8.4–10.5)
Chloride: 105 mEq/L (ref 96–112)
GFR calc Af Amer: >90 mL/min (ref >90)
Glucose, Bld: 87 mg/dL (ref 70–99)
Potassium: 4.5 mEq/L (ref 3.7–5.3)
Sodium: 141 mEq/L (ref 137–147)
Total Bilirubin: <0.2 mg/dL — ABNORMAL LOW (ref 0.3–1.2)
Total Protein: 5.9 g/dL — ABNORMAL LOW (ref 6.0–8.3)

## 2014-09-07 LAB — PROTEIN / CREATININE RATIO, URINE
CREATININE, URINE: 51.66 mg/dL
Protein Creatinine Ratio: 1.18 — ABNORMAL HIGH (ref 0.00–0.15)
Total Protein, Urine: 60.9 mg/dL

## 2014-09-07 LAB — MRSA PCR SCREENING: MRSA by PCR: NEGATIVE

## 2014-09-07 LAB — PRO B NATRIURETIC PEPTIDE: Pro B Natriuretic peptide (BNP): 1066 pg/mL — ABNORMAL HIGH (ref 0–125)

## 2014-09-07 MED ORDER — IBUPROFEN 600 MG PO TABS
600.0000 mg | ORAL_TABLET | Freq: Four times a day (QID) | ORAL | Status: DC | PRN
  Administered 2014-09-07 – 2014-09-08 (×2): 600 mg via ORAL
  Filled 2014-09-07 (×2): qty 1

## 2014-09-07 MED ORDER — MAGNESIUM SULFATE BOLUS VIA INFUSION
4.0000 g | Freq: Once | INTRAVENOUS | Status: AC
  Administered 2014-09-07: 4 g via INTRAVENOUS
  Filled 2014-09-07: qty 500

## 2014-09-07 MED ORDER — ZOLPIDEM TARTRATE 5 MG PO TABS: 5.0000 mg | ORAL_TABLET | Freq: Every evening | ORAL | Status: DC | PRN

## 2014-09-07 MED ORDER — FERROUS SULFATE 325 (65 FE) MG PO TABS
325.0000 mg | ORAL_TABLET | Freq: Two times a day (BID) | ORAL | Status: DC
  Administered 2014-09-07 – 2014-09-09 (×4): 325 mg via ORAL
  Filled 2014-09-07 (×4): qty 1

## 2014-09-07 MED ORDER — PRENATAL MULTIVITAMIN CH
1.0000 | ORAL_TABLET | Freq: Every day | ORAL | Status: DC
  Administered 2014-09-08: 1 via ORAL
  Filled 2014-09-07 (×3): qty 1

## 2014-09-07 MED ORDER — CALCIUM CARBONATE ANTACID 500 MG PO CHEW
2.0000 | CHEWABLE_TABLET | ORAL | Status: DC | PRN
  Filled 2014-09-07: qty 2

## 2014-09-07 MED ORDER — DOCUSATE SODIUM 100 MG PO CAPS
100.0000 mg | ORAL_CAPSULE | Freq: Every day | ORAL | Status: DC
  Administered 2014-09-08 – 2014-09-09 (×2): 100 mg via ORAL
  Filled 2014-09-07 (×4): qty 1

## 2014-09-07 MED ORDER — ACETAMINOPHEN 325 MG PO TABS
650.0000 mg | ORAL_TABLET | ORAL | Status: DC | PRN
Start: 2014-09-07 — End: 2014-09-09
  Administered 2014-09-08 – 2014-09-09 (×4): 650 mg via ORAL
  Filled 2014-09-07 (×4): qty 2

## 2014-09-07 MED ORDER — MAGNESIUM SULFATE 40 G IN LACTATED RINGERS - SIMPLE
2.0000 g/h | INTRAVENOUS | Status: DC
  Filled 2014-09-07: qty 500

## 2014-09-07 MED ORDER — FUROSEMIDE 10 MG/ML IJ SOLN
20.0000 mg | Freq: Two times a day (BID) | INTRAMUSCULAR | Status: DC
  Administered 2014-09-07 – 2014-09-09 (×4): 20 mg via INTRAVENOUS
  Filled 2014-09-07 (×4): qty 2

## 2014-09-07 MED ORDER — LACTATED RINGERS IV SOLN
INTRAVENOUS | Status: DC
  Administered 2014-09-07: 16:00:00 via INTRAVENOUS
  Administered 2014-09-08: 75 mL/h via INTRAVENOUS

## 2014-09-07 NOTE — Progress Notes (Signed)
Patient transferred to AICU bed #373. Report given to Raynelle FanningJulie, [place]RN

## 2014-09-07 NOTE — Consult Note (Signed)
Visit made to mom who was readmitted to AICU 5 days post partum with elevated blood pressure and SOB.  Lactation worked with mom after delivery due to difficult latch.  Baby had frenotomy prior to discharge.  Mom has been pumping at home every 3 hours and not producing milk.  She is exhausted and teary eyed.  Inetta Fermoina states she and her husband have recently discussed if she should continue pumping or change to formula feeding.  Baby has already been receiving formula due to feeding difficulties.  Supported mom in her decision to change to formula feeding.

## 2014-09-07 NOTE — H&P (Signed)
Misty Little is a 38 y.o. female G2P1011 pt of HRC presenting on Day 5 following PLTCS with SOB and elevated BP.  She went to her primary care office today with SOB.  SOB episode last night when she tried to lay supine. SOB is worse supine. Progressively getting worse since she left hospital. Primary care was concerned with SOB and elevated BP so did CXR, then recommended she see her OB so she called clinic and was sent to MAU.  She also reports difficulty breastfeeding with poor latch.  She has not pumped or nursed the infant x7-8 hours today and is considering switching to all formula feeding.  She reports light lochia, denies vaginal itching/burning, urinary symptoms, h/a, dizziness, n/v, or fever/chills.     Maternal Medical History:  Reason for admission: Nausea.  Prenatal complications: PIH.     OB History   Grav Para Term Preterm Abortions TAB SAB Ect Mult Living   2 1 1  0 1 0 1 0 0 1     Past Medical History  Diagnosis Date  . Sturge-Weber syndrome with glaucoma   . Infection     uti  . Anemia   . Headache(784.0)   . Seizures     "small ones as child"   Past Surgical History  Procedure Laterality Date  . Eye surgery  1977  . Toe surgery  1977  . Cesarean section N/A 09/02/2014    Procedure: CESAREAN SECTION;  Surgeon: Catalina AntiguaPeggy Constant, MD;  Location: [facility];  Service: Obstetrics;  Laterality: N/A;   Family History: family history includes Diabetes in her maternal aunt and maternal grandmother; Heart disease in her mother; Hypertension in her father. There is no history of Hearing loss. Social History:  reports that she has never smoked. She has never used smokeless tobacco. She reports that she does not drink alcohol or use illicit drugs.    Review of Systems  Constitutional: Negative for fever, chills and malaise/fatigue.  Eyes: Negative for blurred vision.  Respiratory: Positive for cough and shortness of breath.   Cardiovascular: Negative for chest pain.   Gastrointestinal: Negative for heartburn, nausea and vomiting.  Genitourinary: Negative for dysuria, urgency and frequency.  Musculoskeletal: Negative.   Neurological: Negative for dizziness and headaches.  Psychiatric/Behavioral: Negative for depression.      Blood pressure 140/77, pulse 82, temperature 98.8 F (37.1 C), temperature source Oral, resp. rate 18, last menstrual period 11/23/2013, SpO2 99.00%, unknown if currently breastfeeding. Exam Physical Exam  Nursing note and vitals reviewed. Constitutional: She is oriented to person, place, and time. She appears well-developed and well-nourished.  Neck: Normal range of motion.  Cardiovascular: Normal rate, regular rhythm and normal heart sounds.   Respiratory: Effort normal and breath sounds normal.  GI: Soft. There is tenderness.  Musculoskeletal: Normal range of motion. She exhibits edema.  2+ pitting BLE   Neurological: She is alert and oriented to person, place, and time.  Skin: Skin is warm and dry.  Psychiatric: She has a normal mood and affect. Her behavior is normal. Judgment and thought content normal.    Prenatal labs: ABO, Rh: --/--/A NEG (10/04 0750) Antibody: NEG (10/04 0750) Rubella: 1.40 (02/03 0911) RPR: NON REAC (10/01 1300)  HBsAg: NEGATIVE (02/03 0911)  HIV: NONREACTIVE (10/01 1300)  GBS: Negative (09/01 0000)   Assessment/Plan: 1. Hemolysis, elevated liver enzymes, and low platelet (HELLP) syndrome during pregnancy, postpartum   2. Shortness of breath   3. Iron deficiency anemia     Consult  Dr Adrian BlackwaterStinson Admit to AICU Mag sulfate bolus/2g/hour Echocardiogram--[facility] Ferrous sulfate 325 BID ProBNP ordered Lasix 20 mg IV BID [facility] done today--scanned into chart Discussed availability of breastpump and lactation with pt.  Pt to decide if she will continue pumping/breastfeeding at this time.     LEFTWICH-KIRBY,  Medrith Veillon 09/07/2014, 3:03 PM

## 2014-09-07 NOTE — MAU Note (Signed)
Patient states she had a cesarean section on 10-4. States she has been having shortness of breath during the pregnancy and since she delivered. Was seen by her primary today and was told she needed to be evaluated for her elevated blood pressure. Hard to breath when lying down.

## 2014-09-07 NOTE — Telephone Encounter (Signed)
Misty Little called and state she is at her primary care MD for shortness of breath, but her blood pressure is 170/96 and since she is recently postpartum they told her to call us and make an appointment.  I instructed her to come to MAU for evaluation.

## 2014-09-07 NOTE — Progress Notes (Signed)
Unable to obtain IV access. CRNA requested to start IV. Charlesetta, CRNA in pt room at this time.

## 2014-09-07 NOTE — Progress Notes (Signed)
Dr. Adrian BlackwaterStinson in to see patient and discuss plan of care. Patient tearful, encouraged to talk about feelings. States she feels some soreness in incision area. Has not taken Ibuprofen since early AM. Dr. Adrian BlackwaterStinson spoke of Ibuprofen for pain and Lasix.

## 2014-09-07 NOTE — MAU Provider Note (Signed)
History     CSN: [id number]  Arrival date and time: 09/07/14 1144   First Provider Initiated Contact with Patient 09/07/14 1357      Chief Complaint  Patient presents with  . Shortness of Breath   HPI  Misty Little is a 38 y.o. 502P1011 female with a history of a presenting on Day 5 following PLTCS with SOB and elevated BP. SOB episode last night when she tried to lay supine. SOB is worse supine and with exertion. Progressively getting worse since she left hospital. Furosemide rxd this morning by their PCP but has not had a chance to refill. General abdominal pain, started yesterday, 3/10. Described as soreness and localized to lower abdomen. Dizziness the last couple of days, progressively worse, no LOC or falls. Pt denies vaginal discharge, vomiting or headaches. She endorses nausea, pluritic chest pain, and feeling depressed.    OB History   Grav Para Term Preterm Abortions TAB SAB Ect Mult Living   2 1 1  0 1 0 1 0 0 1      Past Medical History  Diagnosis Date  . Sturge-Weber syndrome with glaucoma   . Infection     uti  . Anemia   . Headache(784.0)   . Seizures     "small ones as child"    Past Surgical History  Procedure Laterality Date  . Eye surgery  1977  . Toe surgery  1977  . Cesarean section N/A 09/02/2014    Procedure: CESAREAN SECTION;  Surgeon: Catalina AntiguaPeggy Constant, MD;  Location: [facility];  Service: Obstetrics;  Laterality: N/A;    Family History  Problem Relation Age of Onset  . Heart disease Mother   . Diabetes Maternal Aunt   . Diabetes Maternal Grandmother   . Hearing loss Neg Hx   . Hypertension Father     History  Substance Use Topics  . Smoking status: Never Smoker   . Smokeless tobacco: Never Used  . Alcohol Use: No     Comment: occasional before pregnancy    Allergies:  Allergies  Allergen Reactions  . Other Other (See Comments)    Pt states that she is allergic to walnuts- Reaction:  Causes tongue to hurt.     Prescriptions prior to  admission  Medication Sig Dispense Refill  . calcium carbonate (TUMS - DOSED IN MG ELEMENTAL CALCIUM) 500 MG chewable tablet Chew 2 tablets by mouth 3 (three) times daily as needed for indigestion or heartburn.      Marland Kitchen. ibuprofen (ADVIL,MOTRIN) 600 MG tablet Take 1 tablet (600 mg total) by mouth every 6 (six) hours.  30 tablet  0  . oxyCODONE-acetaminophen (PERCOCET/ROXICET) 5-325 MG per tablet Take 1 tablet by mouth every 4 (four) hours as needed (for pain scale less than 7).  15 tablet  0  . Prenatal Vit-Fe Fumarate-FA (PRENATAL MULTIVITAMIN) TABS tablet Take 1 tablet by mouth daily at 12 noon.      . promethazine (PHENERGAN) 25 MG tablet Take 12.5 mg by mouth every 6 (six) hours as needed for nausea or vomiting.       Results for orders placed during the hospital encounter of 09/07/14 (from the past 24 hour(s))  PROTEIN / CREATININE RATIO, URINE     Status: Abnormal   Collection Time    09/07/14 12:00 PM      Result Value Ref Range   Creatinine, Urine 51.66     Total Protein, Urine 60.9     Protein Creatinine Ratio 1.18 (*)  0.00 - 0.15  CBC     Status: Abnormal   Collection Time    09/07/14 12:42 PM      Result Value Ref Range   WBC 8.0  4.0 - 10.5 K/uL   RBC 2.65 (*) 3.87 - 5.11 MIL/uL   Hemoglobin 7.9 (*) 12.0 - 15.0 g/dL   HCT 74.224.2 (*) 59.536.0 - 63.846.0 %   MCV 91.3  78.0 - 100.0 fL   MCH 29.8  26.0 - 34.0 pg   MCHC 32.6  30.0 - 36.0 g/dL   RDW 75.615.6 (*) 43.311.5 - 29.515.5 %   Platelets 225  150 - 400 K/uL  COMPREHENSIVE METABOLIC PANEL     Status: Abnormal   Collection Time    09/07/14 12:42 PM      Result Value Ref Range   Sodium 141  137 - 147 mEq/L   Potassium 4.5  3.7 - 5.3 mEq/L   Chloride 105  96 - 112 mEq/L   CO2 23  19 - 32 mEq/L   Glucose, Bld 87  70 - 99 mg/dL   BUN 14  6 - 23 mg/dL   Creatinine, Ser 1.880.80  0.50 - 1.10 mg/dL   Calcium 8.7  8.4 - 41.610.5 mg/dL   Total Protein 5.9 (*) 6.0 - 8.3 g/dL   Albumin 2.2 (*) 3.5 - 5.2 g/dL   AST 606102 (*) 0 - 37 U/L   ALT 51 (*) 0 - 35  U/L   Alkaline Phosphatase 239 (*) 39 - 117 U/L   Total Bilirubin <0.2 (*) 0.3 - 1.2 mg/dL   GFR calc non Af Amer >90  >90 mL/min   GFR calc Af Amer >90  >90 mL/min   Anion gap 13  5 - 15     Review of Systems  Constitutional: Positive for chills and malaise/fatigue. Negative for fever.  HENT: Negative for nosebleeds and sore throat.   Eyes: Negative for blurred vision and double vision.  Respiratory: Positive for cough, shortness of breath and wheezing. Negative for hemoptysis and sputum production.   Cardiovascular: Negative for chest pain (Plluritic pain) and palpitations.  Gastrointestinal: Positive for nausea and abdominal pain. Negative for heartburn, vomiting, diarrhea and constipation.  Genitourinary: Negative for dysuria.  Neurological: Positive for dizziness, tingling (right leg) and weakness. Negative for loss of consciousness and headaches.  Psychiatric/Behavioral: Positive for depression. Negative for substance abuse.   Physical Exam   Blood pressure 144/71, pulse 85, temperature 98.1 F (36.7 C), temperature source Oral, resp. rate 16, last menstrual period 11/23/2013, SpO2 97.00%, unknown if currently breastfeeding.  Physical Exam  Constitutional: She is oriented to person, place, and time. She appears well-developed and well-nourished. She appears distressed (mild).  HENT:  Head: Normocephalic.  Eyes: EOM are normal.  Neck: Normal range of motion.  Cardiovascular: Normal rate, regular rhythm, normal heart sounds and intact distal pulses.   No murmur heard. Respiratory: Breath sounds normal. She is in respiratory distress (SOB).  GI: Soft. She exhibits no distension. There is no hepatomegaly. There is tenderness in the left upper quadrant. There is no rebound and no guarding.  Neurological: She is alert and oriented to person, place, and time.  Skin: Skin is warm and dry.  Psychiatric: She exhibits a depressed mood.    MAU Course  Procedures  MDM Patient  stable during MAU stay  Assessment and Plan  A:  1. Hemolysis, elevated liver enzymes, and low platelet (HELLP) syndrome during pregnancy, postpartum   2. Shortness of  breath   3. Iron deficiency anemia     P:   Consult Dr Adrian Blackwater  Admit to AICU  Ferrous sulfate 325 mg BID  Echocardiogram  Mag sulfate bolus/2g/hour  ProBNP ordered   Lasix 20 mg IV BID  X-Ray records obtained from [facility] done today--scanned into Epic  Discussed availability of breastpump and lactation with pt. Pt to decide if she will continue pumping/breastfeeding at this time.   Pt tearful about leaving baby but discussed with husband and decision for baby to go to stay with relative and husband to spend night in hospital with pt.  Pt bonding well but difficulties with breastfeeding and readmission to hospital causing stress.     Rosemary Holms 09/07/2014, 3:59 PM   I have seen this patient and agree with the above PA student's note.  See H&P.  LEFTWICH-KIRBY, Zackary Mckeone Certified Nurse-Midwife

## 2014-09-08 ENCOUNTER — Inpatient Hospital Stay (HOSPITAL_COMMUNITY): Payer: BC Managed Care – PPO

## 2014-09-08 LAB — CBC WITH DIFFERENTIAL/PLATELET
BASOS ABS: 0 10*3/uL (ref 0.0–0.1)
BASOS PCT: 1 % (ref 0–1)
EOS PCT: 2 % (ref 0–5)
Eosinophils Absolute: 0.1 10*3/uL (ref 0.0–0.7)
HEMATOCRIT: 27.3 % — AB (ref 36.0–46.0)
Hemoglobin: 9 g/dL — ABNORMAL LOW (ref 12.0–15.0)
Lymphocytes Relative: 8 % — ABNORMAL LOW (ref 12–46)
Lymphs Abs: 0.7 10*3/uL (ref 0.7–4.0)
MCH: 30 pg (ref 26.0–34.0)
MCHC: 33 g/dL (ref 30.0–36.0)
MCV: 91 fL (ref 78.0–100.0)
MONO ABS: 0.8 10*3/uL (ref 0.1–1.0)
Monocytes Relative: 9 % (ref 3–12)
Neutro Abs: 7.1 10*3/uL (ref 1.7–7.7)
Neutrophils Relative %: 81 % — ABNORMAL HIGH (ref 43–77)
Platelets: 289 10*3/uL (ref 150–400)
RBC: 3 MIL/uL — ABNORMAL LOW (ref 3.87–5.11)
RDW: 15.9 % — AB (ref 11.5–15.5)
WBC: 8.8 10*3/uL (ref 4.0–10.5)

## 2014-09-08 LAB — URINALYSIS, ROUTINE W REFLEX MICROSCOPIC
Glucose, UA: 100 mg/dL — AB
Ketones, ur: NEGATIVE mg/dL
NITRITE: NEGATIVE
Protein, ur: 100 mg/dL — AB
Specific Gravity, Urine: 1.01 (ref 1.005–1.030)
UROBILINOGEN UA: 0.2 mg/dL (ref 0.0–1.0)
pH: 7.5 (ref 5.0–8.0)

## 2014-09-08 LAB — COMPREHENSIVE METABOLIC PANEL
ALT: 46 U/L — AB (ref 0–35)
AST: 78 U/L — AB (ref 0–37)
Albumin: 2.1 g/dL — ABNORMAL LOW (ref 3.5–5.2)
Alkaline Phosphatase: 208 U/L — ABNORMAL HIGH (ref 39–117)
Anion gap: 14 (ref 5–15)
BUN: 13 mg/dL (ref 6–23)
CHLORIDE: 101 meq/L (ref 96–112)
CO2: 23 mEq/L (ref 19–32)
CREATININE: 0.69 mg/dL (ref 0.50–1.10)
Calcium: 7.6 mg/dL — ABNORMAL LOW (ref 8.4–10.5)
GFR calc Af Amer: >90 mL/min (ref >90)
Glucose, Bld: 98 mg/dL (ref 70–99)
Potassium: 4 mEq/L (ref 3.7–5.3)
SODIUM: 138 meq/L (ref 137–147)
Total Protein: 5.9 g/dL — ABNORMAL LOW (ref 6.0–8.3)

## 2014-09-08 LAB — URINE MICROSCOPIC-ADD ON

## 2014-09-08 LAB — CBC
HCT: 25.3 % — ABNORMAL LOW (ref 36.0–46.0)
Hemoglobin: 8.1 g/dL — ABNORMAL LOW (ref 12.0–15.0)
MCH: 29 pg (ref 26.0–34.0)
MCHC: 32 g/dL (ref 30.0–36.0)
MCV: 90.7 fL (ref 78.0–100.0)
PLATELETS: 231 10*3/uL (ref 150–400)
RBC: 2.79 MIL/uL — ABNORMAL LOW (ref 3.87–5.11)
RDW: 15.7 % — ABNORMAL HIGH (ref 11.5–15.5)
WBC: 8 10*3/uL (ref 4.0–10.5)

## 2014-09-08 MED ORDER — LABETALOL HCL 200 MG PO TABS
400.0000 mg | ORAL_TABLET | Freq: Two times a day (BID) | ORAL | Status: DC
Start: 2014-09-08 — End: 2014-09-09
  Administered 2014-09-08 – 2014-09-09 (×2): 400 mg via ORAL
  Filled 2014-09-08 (×2): qty 2

## 2014-09-08 MED ORDER — TRIAMTERENE-HCTZ 75-50 MG PO TABS
1.0000 | ORAL_TABLET | Freq: Every day | ORAL | Status: DC
  Administered 2014-09-08: 1 via ORAL
  Filled 2014-09-08 (×2): qty 1

## 2014-09-08 MED ORDER — BUTALBITAL-APAP-CAFFEINE 50-325-40 MG PO TABS
1.0000 | ORAL_TABLET | Freq: Once | ORAL | Status: AC
  Administered 2014-09-08: 1 via ORAL
  Filled 2014-09-08: qty 1

## 2014-09-08 NOTE — Progress Notes (Signed)
Patient ID: Misty Little, female DOB: 04/22/1976, 38 y.o. MRN: [id number]  POD #6 primary Caesarean section G2P1011 with pre eclampsia  Subjective: Called by nurse for fever 101.7. Patient states she feels chills despite being warm. No increased pain, no nausea/vomiting, no SOB or CP. Legs are still swollen but improved compared to before coming in to hospital. Patient does report nasal congestion.  Objective: Filed Vitals:   09/08/14 1700 09/08/14 1800 09/08/14 1900 09/08/14 2000  BP: 163/93 177/89 167/88 156/72  Pulse: 95 98 113 114  Temp:    101.7 F (38.7 C)  TempSrc:    Oral  Resp:    20  Height:      Weight:      SpO2: 100% 99% 96% 96%   General: NAD, pleasant, converses appropriately, non-toxic appearing CV: Elevated rate, but normal s1 and s2, no murmur appreciated. Resp: Clear bilaterally, no appreciable w/r/c. Normal effort. Abdomen: obese, soft, mildly tender periumbilical to deep palpation, no rebound or guarding.. Incision with wound vac applied, no erythema extending beyond dressing, no bleeding or drainage noted on dressing, no significant tenderness over dressing. Normal bowel sounds Ext: 2+ pitting edema bilaterally up to knee. Skin: warm to touch forehead, abdomen, lower legs. Port wine stain on face. Reticular appearing rash on left medial lower leg, patient notes this to be her normal appearance.  Assessment/Plan: Misty Little is a 38 y.o. [id number] POD#6 admitted for pre-E now with fever.  # Fever: SIRS criteria by tachycardia and fever. Non-toxic appearing, normal lung exam, reassuring abdominal exam. Limiting fluids in setting of fluid overload and diuresis. DDx including infectious etiology (UTI, pneumonia, endometritis), low suspicion for DVT/PE or drug reaction. - CXR - UA and urine culture - CBC with diff - tylenol PRN for fever  # Pre-E: BPs still elevated. S/p mag d/c'd at 15:00 today.  - continue labetalol 400mg  BID (first dose tonight)  Tawni CarnesAndrew Nolen Lindamood,  MD 09/08/2014, 9:53 PM PGY-2, [facility]

## 2014-09-08 NOTE — H&P (Signed)
Attestation of Attending Supervision of Advanced Practitioner (PA/CNM/NP): Evaluation and management procedures were performed by the Advanced Practitioner under my supervision and collaboration.  I have seen and examined this patient.  I have reviewed the Advanced Practitioner's note and chart, and I agree with the management and plan.  Candelaria CelesteJacob Stinson, DO Attending Physician Faculty Practice, [facility]

## 2014-09-08 NOTE — MAU Provider Note (Signed)
Attestation of Attending Supervision of Advanced Practitioner (PA/CNM/NP): Evaluation and management procedures were performed by the Advanced Practitioner under my supervision and collaboration.  I have reviewed the Advanced Practitioner's note and chart, and I agree with the management and plan.  Margalit Leece, DO Attending Physician Faculty Practice, [facility]  

## 2014-09-08 NOTE — Progress Notes (Signed)
Patient ID: Laretta Bolsterina L Weinman, female   DOB: 05/25/1976, 38 y.o.   MRN: [id number] POD #6 primary Caesarean section G2P1011 with pre eclampsia  Filed Vitals:   09/08/14 0700 09/08/14 0701 09/08/14 0702 09/08/14 0818  BP: 128/67   128/79  Pulse: 91 87 91 110  Temp:    98.5 F (36.9 C)  TempSrc:    Oral  Resp:    30  Height:      Weight:      SpO2:    93%   Pt doing well this am, no breathing issues   Intake/Output Summary (Last 24 hours) at 09/08/14 0842 Last data filed at 09/08/14 0800  Gross per 24 hour  Intake 2497.92 ml  Output   6500 ml  Net -4002.08 ml   Abdomen soft normal post op  Results for orders placed during the hospital encounter of 09/07/14 (from the past 48 hour(s))  PROTEIN / CREATININE RATIO, URINE   Collection Time    09/07/14 12:00 PM      Result Value Ref Range   Creatinine, Urine 51.66     Total Protein, Urine 60.9     Protein Creatinine Ratio 1.18 (*) 0.00 - 0.15  CBC   Collection Time    09/07/14 12:42 PM      Result Value Ref Range   WBC 8.0  4.0 - 10.5 K/uL   RBC 2.65 (*) 3.87 - 5.11 MIL/uL   Hemoglobin 7.9 (*) 12.0 - 15.0 g/dL   HCT 33.224.2 (*) 95.136.0 - 88.446.0 %   MCV 91.3  78.0 - 100.0 fL   MCH 29.8  26.0 - 34.0 pg   MCHC 32.6  30.0 - 36.0 g/dL   RDW 16.615.6 (*) 06.311.5 - 01.615.5 %   Platelets 225  150 - 400 K/uL  COMPREHENSIVE METABOLIC PANEL   Collection Time    09/07/14 12:42 PM      Result Value Ref Range   Sodium 141  137 - 147 mEq/L   Potassium 4.5  3.7 - 5.3 mEq/L   Chloride 105  96 - 112 mEq/L   CO2 23  19 - 32 mEq/L   Glucose, Bld 87  70 - 99 mg/dL   BUN 14  6 - 23 mg/dL   Creatinine, Ser 0.100.80  0.50 - 1.10 mg/dL   Calcium 8.7  8.4 - 93.210.5 mg/dL   Total Protein 5.9 (*) 6.0 - 8.3 g/dL   Albumin 2.2 (*) 3.5 - 5.2 g/dL   AST 355102 (*) 0 - 37 U/L   ALT 51 (*) 0 - 35 U/L   Alkaline Phosphatase 239 (*) 39 - 117 U/L   Total Bilirubin <0.2 (*) 0.3 - 1.2 mg/dL   GFR calc non Af Amer >90  >90 mL/min   GFR calc Af Amer >90  >90 mL/min   Anion gap 13  5  - 15  PRO B NATRIURETIC PEPTIDE   Collection Time    09/07/14 12:42 PM      Result Value Ref Range   Pro B Natriuretic peptide (BNP) 1066.0 (*) 0 - 125 pg/mL  MRSA PCR SCREENING   Collection Time    09/07/14  8:25 PM      Result Value Ref Range   MRSA by PCR NEGATIVE  NEGATIVE  CBC   Collection Time    09/08/14  5:30 AM      Result Value Ref Range   WBC 8.0  4.0 - 10.5 K/uL   RBC 2.79 (*) 3.87 -  5.11 MIL/uL   Hemoglobin 8.1 (*) 12.0 - 15.0 g/dL   HCT 11.925.3 (*) 14.736.0 - 82.946.0 %   MCV 90.7  78.0 - 100.0 fL   MCH 29.0  26.0 - 34.0 pg   MCHC 32.0  30.0 - 36.0 g/dL   RDW 56.215.7 (*) 13.011.5 - 86.515.5 %   Platelets 231  150 - 400 K/uL  COMPREHENSIVE METABOLIC PANEL   Collection Time    09/08/14  5:30 AM      Result Value Ref Range   Sodium 138  137 - 147 mEq/L   Potassium 4.0  3.7 - 5.3 mEq/L   Chloride 101  96 - 112 mEq/L   CO2 23  19 - 32 mEq/L   Glucose, Bld 98  70 - 99 mg/dL   BUN 13  6 - 23 mg/dL   Creatinine, Ser 7.840.69  0.50 - 1.10 mg/dL   Calcium 7.6 (*) 8.4 - 10.5 mg/dL   Total Protein 5.9 (*) 6.0 - 8.3 g/dL   Albumin 2.1 (*) 3.5 - 5.2 g/dL   AST 78 (*) 0 - 37 U/L   ALT 46 (*) 0 - 35 U/L   Alkaline Phosphatase 208 (*) 39 - 117 U/L   Total Bilirubin <0.2 (*) 0.3 - 1.2 mg/dL   GFR calc non Af Amer >90  >90 mL/min   GFR calc Af Amer >90  >90 mL/min   Anion gap 14  5 - 15   Improving LFT  Continue magnesium until this afternoon, then if stable transfer to floor Begun on Maxzide 75/50

## 2014-09-08 NOTE — Progress Notes (Signed)
09/08/14 2000  Vitals  Temp ! 101.7 F (38.7 C)  Temp Source Oral  BP ! 156/72 mmHg  MAP (mmHg) 92  BP Location Right arm  BP Method Automatic  Patient Position (if appropriate) Sitting  Pulse Rate ! 114  Resp 20  Oxygen Therapy  SpO2 96 %  O2 Device None (Room air)  Pt seen by Dr Cliffton AstersWhite and Cala BradfordKimberly CNM. Orders received for CBC, U/A & Culture and a Chest X- ray.

## 2014-09-09 DIAGNOSIS — R5082 Postprocedural fever: Secondary | ICD-10-CM | POA: Diagnosis not present

## 2014-09-09 LAB — COMPREHENSIVE METABOLIC PANEL
ALBUMIN: 2.2 g/dL — AB (ref 3.5–5.2)
ALT: 35 U/L (ref 0–35)
AST: 34 U/L (ref 0–37)
Alkaline Phosphatase: 181 U/L — ABNORMAL HIGH (ref 39–117)
Anion gap: 14 (ref 5–15)
BUN: 15 mg/dL (ref 6–23)
CHLORIDE: 97 meq/L (ref 96–112)
CO2: 24 meq/L (ref 19–32)
Calcium: 7.6 mg/dL — ABNORMAL LOW (ref 8.4–10.5)
Creatinine, Ser: 0.93 mg/dL (ref 0.50–1.10)
GFR calc Af Amer: 89 mL/min — ABNORMAL LOW (ref >90)
GFR, EST NON AFRICAN AMERICAN: 77 mL/min — AB (ref >90)
Glucose, Bld: 103 mg/dL — ABNORMAL HIGH (ref 70–99)
Potassium: 4 mEq/L (ref 3.7–5.3)
SODIUM: 135 meq/L — AB (ref 137–147)
Total Bilirubin: <0.2 mg/dL — ABNORMAL LOW (ref 0.3–1.2)
Total Protein: 5.6 g/dL — ABNORMAL LOW (ref 6.0–8.3)

## 2014-09-09 MED ORDER — LABETALOL HCL 200 MG PO TABS: 400.0000 mg | ORAL_TABLET | Freq: Two times a day (BID) | ORAL | Status: DC

## 2014-09-09 MED ORDER — TRIAMTERENE-HCTZ 37.5-25 MG PO TABS
2.0000 | ORAL_TABLET | Freq: Every day | ORAL | Status: DC
  Administered 2014-09-09: 2 via ORAL
  Filled 2014-09-09: qty 2

## 2014-09-09 MED ORDER — TRIAMTERENE-HCTZ 75-50 MG PO TABS: 1.0000 | ORAL_TABLET | Freq: Every day | ORAL | Status: DC

## 2014-09-09 NOTE — Progress Notes (Signed)
Pt discharged home with husband via W/C, accompanied by RN. VSS and pt without complaints upon discharge.

## 2014-09-09 NOTE — Progress Notes (Signed)
Chest xray completed.

## 2014-09-09 NOTE — Progress Notes (Signed)
09/09/14 1111  Vitals  BP ! 103/51 mmHg  MAP (mmHg) 62  Pulse Rate 74  Resp 16  Oxygen Therapy  SpO2 96 %  O2 Device None (Room air)  Discussed BP's with Dr. Despina HiddenEure. Instructed pt to notify for BP's > 150/105 and < 95/50.

## 2014-09-09 NOTE — Discharge Summary (Signed)
Physician Discharge Summary  Patient ID: Misty Little MRN: [id number] DOB/AGE: 38/03/1976 38 y.o.  Admit date: 09/07/2014 Discharge date: 09/09/2014  Admission Diagnoses: #1 hellp syndrome #2 postop day #5 from primary cesarean section    Discharge Diagnoses:  #1 HELLP syndrome #2 postop day #7 #3 postoperative fever  Discharged Condition: good  Hospital Course: Patient was admitted due to HELLP syndrome and elevated blood pressures. She was admitted to the ICU on magnesium and on Lasix for diuresis as she did appear to be fluid overloaded. The patient diuresed 9 L of urine and was less dyspneic. Her blood pressures were still elevated off her magnesium and she was continued on Maxzide and labetalol.  With the addition of her blood pressure medications, her blood pressures have been normal. She did develop a fever on hospital day #1, which resolved with Tylenol. She is been afebrile since that time. Additionally, urinalysis, chest x-ray, blood work were unrevealing and normal. She is being discharged today in stable condition to followup in the outpatient clinic in approximately one week.   Consults: None  Significant Diagnostic Studies:  #1 Chest x-ray a normal #2 urinalysis was normal #3 liver enzymes showed an AST of 102 and an ALT of 51 on admission which gradually returned to normayourl on day of discharge #4 urine protein creatinine ratio was 1.18   Treatments: Diuresis with Lasix  Discharge Exam: Blood pressure 135/77, pulse 78, temperature 98.5 F (36.9 C), temperature source Oral, resp. rate 20, height 5\' 1"  (1.549 m), weight 125.828 kg (277 lb 6.4 oz), SpO2 94.00%, unknown if currently breastfeeding. General appearance: alert, cooperative and no distress GI: soft, non-tender; bowel sounds normal; no masses,  no organomegaly and Wound vac in place. No discoloration of pad Extremities: extremities normal, atraumatic, no cyanosis or edema and Homans sign is negative, no sign  of DVT Pulses: 2+ and symmetric Skin: Skin color, texture, turgor normal. No rashes or lesions Neurologic: Alert and oriented X 3, normal strength and tone. Normal symmetric reflexes. Normal coordination and gait  Disposition: 01-Home or Self Care  Discharge Instructions   Discharge patient    Complete by:  As directed             Medication List    STOP taking these medications       ibuprofen 600 MG tablet  Commonly known as:  ADVIL,MOTRIN      TAKE these medications       calcium carbonate 500 MG chewable tablet  Commonly known as:  TUMS - dosed in mg elemental calcium  Chew 2 tablets by mouth 3 (three) times daily as needed for indigestion or heartburn.     labetalol 200 MG tablet  Commonly known as:  NORMODYNE  Take 2 tablets (400 mg total) by mouth 2 (two) times daily.     oxyCODONE-acetaminophen 5-325 MG per tablet  Commonly known as:  PERCOCET/ROXICET  Take 1 tablet by mouth every 4 (four) hours as needed (for pain scale less than 7).     prenatal multivitamin Tabs tablet  Take 1 tablet by mouth daily at 12 noon.     promethazine 25 MG tablet  Commonly known as:  PHENERGAN  Take 12.5 mg by mouth every 6 (six) hours as needed for nausea or vomiting.     triamterene-hydrochlorothiazide 75-50 MG per tablet  Commonly known as:  MAXZIDE  Take 1 tablet by mouth daily.           Follow-up Information  [facility]. (HTN)    Contact information:   [address] [place] [id number] [phone]      Signed: Candelaria CelesteSTINSON, Jilene Spohr JEHIEL 09/09/2014, 7:43 AM

## 2014-09-09 NOTE — Discharge Instructions (Signed)
HELLP Syndrome HELLP syndrome is a life-threatening liver disorder thought to be a type of severe preeclampsia during pregnancy. Preeclampsia is a disorder of pregnancy that causes high blood pressure and protein in the urine. It develops after the 20th week of pregnancy. The name HELLP stands for:  H - hemolytic anemia, hemolysis (destruction of blood cells).  EL - elevated liver enzymes (sign of liver damage).  LP - low platelet count (blood cells that help stop bleeding). HELLP syndrome often occurs without warning and can be difficult to recognize. In most cases, HELLP syndrome occurs before 35 weeks of pregnancy, but it can also develop right after childbirth. It can be fatal to both the mother and baby. CAUSES The cause of HELLP syndrome is unknown at this time. SIGNS AND SYMPTOMS   A headache.  Blurry vision.  Pain in the upper right abdomen.  Shoulder, neck, and upper body pain.  Fatigue.  Feeling sick.  Seizures.  Extreme weight gain or swelling. DIAGNOSIS Blood tests are performed to confirm the diagnosis. These tests include:  A complete blood count.  Liver enzymes test (liver function tests).  Kidney function test.  Measurement of salts in your blood (electrolytes).  Blood coagulation tests. TREATMENT  The main treatment for HELLP syndrome is to deliver your baby as soon as possible. This may be done by giving you medicines to start contractions (induction of labor) or cesarean delivery.  Before delivery, you can be treated for a short time with an injection of magnesium sulphate. This medicine reduces muscle contractions and blocks the impulse from nerves to the muscles, which helps prevent seizures.  Medicines to lower and control blood pressure may also be used. Corticosteroids may be given to help your baby's lungs mature faster.  Your health care provider may recommend that you take one low-dose aspirin (81 mg) each day to help prevent high blood  pressure during your pregnancy if you are at risk for preeclampsia. You may be at risk for preeclampsia if:  You had preeclampsia or eclampsia during a previous pregnancy.  Your baby did not grow as expected during a previous pregnancy.  You experienced preterm birth with a previous pregnancy.  You experienced a separation of the placenta from the uterus (placental abruption) during a previous pregnancy.  You experienced the loss of your baby during a previous pregnancy.  You are pregnant with more than one baby.  You have other medical conditions (such as diabetes or autoimmune disease).  Continuous medical management and monitoring of you and your baby is needed. This is true during pregnancy, during labor, during delivery, and after delivery (postpartum). A blood transfusion may be required if bleeding problems become severe. SEEK IMMEDIATE MEDICAL CARE IF: You have symptoms of HELLP syndrome during your pregnancy. You may need to call your local emergency services (911 in [place] to get to the hospital as soon as possible. Do not drive yourself to the hospital. Document Released: 02/22/2007 Document Revised: 11/21/2013 Document Reviewed: 05/04/2013 [facility] Patient Information 2015 [employer] [place]. This information is not intended to replace advice given to you by your health care provider. Make sure you discuss any questions you have with your health care provider.

## 2014-09-10 LAB — URINE CULTURE: Colony Count: 50000

## 2014-09-10 NOTE — Discharge Summary (Signed)
Attestation of Attending Supervision of Fellow: Evaluation and management procedures were performed by the Fellow under my supervision and collaboration.  I have reviewed the Fellow's note and chart, and I agree with the management and plan.    

## 2014-09-10 NOTE — Progress Notes (Signed)
Post discharge chart review completed.  

## 2014-09-12 ENCOUNTER — Telehealth: Payer: Self-pay | Admitting: General Practice

## 2014-09-12 NOTE — Telephone Encounter (Signed)
[employer] called wanting to verify dates of service and delivery date and left callback number of [phone] claim #[id number]

## 2014-09-13 NOTE — Telephone Encounter (Signed)
Received signed release from patient- called [employer] back and verified date of delivery and other necessary information. Also discussed they had sent a written request for information,that does not need to be faxed back because we discussed by phone.

## 2014-09-19 ENCOUNTER — Encounter: Payer: Self-pay | Admitting: *Deleted

## 2014-10-01 ENCOUNTER — Encounter (HOSPITAL_COMMUNITY): Payer: Self-pay | Admitting: *Deleted

## 2014-10-11 ENCOUNTER — Encounter: Payer: Self-pay | Admitting: Obstetrics and Gynecology

## 2014-10-11 ENCOUNTER — Ambulatory Visit (INDEPENDENT_AMBULATORY_CARE_PROVIDER_SITE_OTHER): Payer: Self-pay | Admitting: Obstetrics and Gynecology

## 2014-10-11 MED ORDER — NORGESTIMATE-ETH ESTRADIOL 0.25-35 MG-MCG PO TABS: 1.0000 | ORAL_TABLET | Freq: Every day | ORAL | Status: DC

## 2014-10-11 NOTE — Progress Notes (Signed)
  Subjective:     Misty Little is a 38 y.o. female who presents for a postpartum visit. She is 6 weeks postpartum following a low cervical transverse Cesarean section. I have fully reviewed the prenatal and intrapartum course. The delivery was at 39 gestational weeks. Outcome: primary cesarean section, low transverse incision. Anesthesia: epidural. Postpartum course has been complicated by a hospital admission for preeclampsia. Baby's course has been uncomplicated. Baby is feeding by bottle - Similac Advance. Bleeding no bleeding. Bowel function is normal. Bladder function is normal. Patient is sexually active. Contraception method is condoms. Postpartum depression screening: negative.     Review of Systems A comprehensive review of systems was negative.   Objective:    BP 116/76 mmHg  Pulse 78  Ht 5' 1.75" (1.568 m)  Wt 267 lb 11.2 oz (121.428 kg)  BMI 49.39 kg/m2  Breastfeeding? No  General:  alert, cooperative and no distress   Breasts:  inspection negative, no nipple discharge or bleeding, no masses or nodularity palpable  Lungs: clear to auscultation bilaterally  Heart:  regular rate and rhythm  Abdomen: soft, non-tender; bowel sounds normal; no masses,  no organomegaly and incision completely healed   Vulva:  normal  Vagina: normal vagina  Cervix:  multiparous appearance and no cervical motion tenderness  Corpus: normal size, contour, position, consistency, mobility, non-tender  Adnexa:  not evaluated  Rectal Exam: Not performed.        Assessment:     Normal postpartum exam. Pap smear not done at today's visit.   Plan:    1. Contraception: OCP (estrogen/progesterone) 2. Patient is medically cleared to resume all activities of daily living 3. Follow up in: 1 year or as needed.

## 2014-10-31 ENCOUNTER — Encounter: Payer: Self-pay | Admitting: Family Medicine

## 2015-06-14 ENCOUNTER — Encounter: Payer: Self-pay | Admitting: Family Medicine

## 2015-06-14 ENCOUNTER — Ambulatory Visit (INDEPENDENT_AMBULATORY_CARE_PROVIDER_SITE_OTHER): Payer: BLUE CROSS/BLUE SHIELD | Admitting: Family Medicine

## 2015-06-14 VITALS — BP 112/76 | HR 71 | Temp 98.2°F | Resp 16 | Ht 62.0 in | Wt 241.8 lb

## 2015-06-14 DIAGNOSIS — K529 Noninfective gastroenteritis and colitis, unspecified: Secondary | ICD-10-CM | POA: Diagnosis not present

## 2015-06-14 NOTE — Progress Notes (Signed)
Subjective:     Patient ID: Misty Little, female   DOB: 03/13/1976, 39 y.o.   MRN: [id number]  HPI  Chief Complaint  Patient presents with  . Abdominal Pain    Patient comes in office today with concerns of LLQ pain and diarrhea that has been occuring over the past 7days. Patient states that she has been having loose BM at least 30 min after eating a meal. She described abdominal pain as a constant cramping feeling  . Dizziness    Patient reports episodes of dizziness intermittent over the past month.   States her young son, Franchot Gallozekiel, had loose stools prior to the onset of her symptoms. Reports prior hx of chronic brief, seconds long episodes of dizziness when she first stands up. 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 States her father has had diverticulosis and another family member cholecystectomy.   Review of Systems      Objective:   Physical Exam  Constitutional: She appears well-developed and well-nourished. She appears distressed (tearful as afraid she has other medical problems to deal with. Accompanied by her son. ).  Cardiovascular: Normal rate and regular rhythm.   Pulmonary/Chest: Breath sounds normal.  Abdominal: There is tenderness (diffuse in upper and left lower quadrant). There is no guarding.  Musculoskeletal: She exhibits no edema (of lower extremities).       Assessment:    1. Gastroenteritis    Plan:    Will try imodium. If persistent area of tenderness consider rx for diverticulitis.

## 2015-06-14 NOTE — Patient Instructions (Addendum)
Discussed use of imodium. If pain persists in one specific area of our abdomen let us know.

## 2015-08-19 ENCOUNTER — Ambulatory Visit (INDEPENDENT_AMBULATORY_CARE_PROVIDER_SITE_OTHER): Payer: BLUE CROSS/BLUE SHIELD | Admitting: Family Medicine

## 2015-08-19 ENCOUNTER — Encounter: Payer: Self-pay | Admitting: Family Medicine

## 2015-08-19 VITALS — BP 102/68 | HR 69 | Resp 16 | Wt 240.6 lb

## 2015-08-19 DIAGNOSIS — R600 Localized edema: Secondary | ICD-10-CM | POA: Insufficient documentation

## 2015-08-19 DIAGNOSIS — H409 Unspecified glaucoma: Secondary | ICD-10-CM | POA: Insufficient documentation

## 2015-08-19 DIAGNOSIS — L259 Unspecified contact dermatitis, unspecified cause: Secondary | ICD-10-CM

## 2015-08-19 DIAGNOSIS — IMO0001 Reserved for inherently not codable concepts without codable children: Secondary | ICD-10-CM | POA: Insufficient documentation

## 2015-08-19 DIAGNOSIS — Z872 Personal history of diseases of the skin and subcutaneous tissue: Secondary | ICD-10-CM | POA: Insufficient documentation

## 2015-08-19 DIAGNOSIS — R030 Elevated blood-pressure reading, without diagnosis of hypertension: Secondary | ICD-10-CM

## 2015-08-19 DIAGNOSIS — N912 Amenorrhea, unspecified: Secondary | ICD-10-CM | POA: Insufficient documentation

## 2015-08-19 MED ORDER — HYDROXYZINE HCL 10 MG PO TABS: 10.0000 mg | ORAL_TABLET | Freq: Three times a day (TID) | ORAL | Status: DC | PRN

## 2015-08-19 MED ORDER — BETAMETHASONE DIPROPIONATE 0.05 % EX CREA: TOPICAL_CREAM | Freq: Two times a day (BID) | CUTANEOUS | Status: DC

## 2015-08-19 NOTE — Progress Notes (Signed)
Patient ID: Misty Little, female   DOB: 12-17-1975, 39 y.o.   MRN: [id number] Name: Misty Little   MRN: [id number]    DOB: 1976-11-28   Date:08/19/2015       Progress Note  Subjective  Chief Complaint  Chief Complaint  Patient presents with  . Rash    left side X 5 days    Rash This is a new problem. The current episode started in the past 7 days. The problem has been gradually worsening since onset. The affected locations include the abdomen and back. The rash is characterized by blistering and redness.   No problem-specific assessment & plan notes found for this encounter.  Past Medical History  Diagnosis Date  . Sturge-Weber syndrome with glaucoma   . Infection     uti  . Anemia   . Headache(784.0)   . Seizures     "small ones as child"   Social History  Substance Use Topics  . Smoking status: Never Smoker   . Smokeless tobacco: Never Used  . Alcohol Use: No     Comment: occasional before pregnancy   Family History  Problem Relation Age of Onset  . Heart disease Mother   . Diabetes Maternal Aunt   . Diabetes Maternal Grandmother   . Alzheimer's disease Maternal Grandmother   . Hearing loss Neg Hx   . Hypertension Father   . Healthy Son   . Alzheimer's disease Maternal Grandfather   . Parkinsonism Maternal Grandfather   . Healthy Sister      Current outpatient prescriptions:  .  IRON PO, Take by mouth., Disp: , Rfl:  .  norgestimate-ethinyl estradiol (ORTHO-CYCLEN,SPRINTEC,PREVIFEM) 0.25-35 MG-MCG tablet, Take 1 tablet by mouth daily., Disp: 1 Package, Rfl: 11  Allergies  Allergen Reactions  . Other Other (See Comments)    Pt states that she is allergic to walnuts- Reaction:  Causes tongue to hurt.    Review of Systems  Constitutional: Negative.   HENT: Negative.   Eyes: Negative.   Respiratory: Negative.   Cardiovascular: Negative.   Gastrointestinal: Negative.   Genitourinary: Negative.   Musculoskeletal: Negative.   Skin: Positive for rash.   Neurological: Negative.   Endo/Heme/Allergies: Negative.   Psychiatric/Behavioral: Negative.    Objective  Filed Vitals:   08/19/15 1042  BP: 102/68  Pulse: 69  Resp: 16  Weight: 240 lb 9.6 oz (109.135 kg)  SpO2: 97%   Physical Exam  Constitutional: She is well-developed, well-nourished, and in no distress.  Cardiovascular: Normal rate and regular rhythm.   Pulmonary/Chest: Effort normal and breath sounds normal.  Skin: Rash noted.  Large pruritic rash left low back/flank (approximately 8 x 10 cm area) in a skin fold.   Assessment & Plan  1. Contact dermatitis Onset over the past 5 days after helping husband cut some wood that involved a large poison ivy vine. Recommend using [place] compresses TID and will treat with Diprolene cream. Add Atarax for itching and recheck prn. - betamethasone dipropionate (DIPROLENE) 0.05 % cream; Apply topically 2 (two) times daily.  Dispense: 30 g; Refill: 0 - hydrOXYzine (ATARAX/VISTARIL) 10 MG tablet; Take 1 tablet (10 mg total) by mouth 3 (three) times daily as needed.  Dispense: 30 tablet; Refill: 0

## 2015-08-22 ENCOUNTER — Encounter: Payer: Self-pay | Admitting: Family Medicine

## 2015-08-22 ENCOUNTER — Ambulatory Visit (INDEPENDENT_AMBULATORY_CARE_PROVIDER_SITE_OTHER): Payer: BLUE CROSS/BLUE SHIELD | Admitting: Family Medicine

## 2015-08-22 ENCOUNTER — Other Ambulatory Visit: Payer: Self-pay | Admitting: Family Medicine

## 2015-08-22 VITALS — BP 106/82 | HR 76 | Temp 98.3°F | Resp 16 | Wt 239.2 lb

## 2015-08-22 DIAGNOSIS — L309 Dermatitis, unspecified: Secondary | ICD-10-CM

## 2015-08-22 NOTE — Progress Notes (Signed)
Patient ID: Misty Little, female   DOB: 1976/08/25, 39 y.o.   MRN: [id number]    Subjective:  Misty Little This is a recurrent problem. The current episode started in the past 7 days. The problem has been rapidly worsening since onset. The affected locations include the abdomen, chest, back, left arm and right arm. The rash is characterized by redness and itchiness. She was exposed to plant contact. Past treatments include antihistamine, topical steroids and anti-itch cream. The treatment provided no relief.     Prior to Admission medications   Medication Sig Start Date End Date Taking? Authorizing Provider  betamethasone dipropionate (DIPROLENE) 0.05 % cream Apply topically 2 (two) times daily. 08/19/15  Yes Dennis E Chrismon, PA  hydrOXYzine (ATARAX/VISTARIL) 10 MG tablet Take 1 tablet (10 mg total) by mouth 3 (three) times daily as needed. 08/19/15  Yes Dennis E Chrismon, PA  IRON PO Take by mouth.   Yes Historical Provider, MD  norgestimate-ethinyl estradiol (ORTHO-CYCLEN,SPRINTEC,PREVIFEM) 0.25-35 MG-MCG tablet Take 1 tablet by mouth daily. 10/11/14  Yes Catalina Antigua, MD    Patient Active Problem List   Diagnosis Date Noted  . Absence of menstruation 08/19/2015  . Blood pressure elevated 08/19/2015  . Glaucoma 08/19/2015  . Personal history of disease of skin and subcutaneous tissue 08/19/2015  . Edema of foot 08/19/2015  . S/P cesarean section 09/06/2014  . Gestational hypertension 08/30/2014  . Dizziness 05/04/2014  . Obesity 04/03/2014  . Antepartum multigravida of advanced maternal age 104/09/2014  . 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 Rh negative state in antepartum period 01/03/2014    Past Medical History  Diagnosis Date  . Sturge-Weber syndrome with glaucoma   . Infection     uti  . Anemia   . Headache(784.0)   . Seizures     "small ones as child"    Social History   Social History  . Marital Status: Married    Spouse Name: N/A  . Number of Children: N/A  .  Years of Education: N/A   Occupational History  . Not on file.   Social History Main Topics  . Smoking status: Never Smoker   . Smokeless tobacco: Never Used  . Alcohol Use: No     Comment: occasional before pregnancy  . Drug Use: No  . Sexual Activity: Yes    Birth Control/ Protection: None   Other Topics Concern  . Not on file   Social History Narrative    Allergies  Allergen Reactions  . Other Other (See Comments)    Pt states that she is allergic to walnuts- Reaction:  Causes tongue to hurt.     Review of Systems  Constitutional: Negative.   HENT: Negative.   Eyes: Negative.   Respiratory: Negative.   Cardiovascular: Negative.   Gastrointestinal: Negative.   Genitourinary: Negative.   Musculoskeletal: Negative.   Skin: Positive for itching and rash.  Neurological: Negative.   Endo/Heme/Allergies: Negative.   Psychiatric/Behavioral: Negative.     Immunization History  Administered Date(s) Administered  . Influenza Split 09/13/2013, 07/31/2014  . Tdap 07/31/2014   Objective:   BP 106/82 mmHg  Pulse 76  Temp(Src) 98.3 F (36.8 C) (Oral)  Resp 16  Wt 239 lb 3.2 oz (108.5 kg)  LMP 08/05/2015  Physical Exam  Constitutional: She is oriented to person, place, and time and well-developed, well-nourished, and in no distress.  Cardiovascular: Normal rate and regular rhythm.   Pulmonary/Chest: Breath sounds normal.  Abdominal: Soft.  Musculoskeletal: Normal range of  motion.  Neurological: She is alert and oriented to person, place, and time.  Skin: Rash noted.  Large area on the left flank is showing some small satellite lesion and some yellowish discharge. Red spots across abdomen with central scabs scattered. All lesions are itchy. No pain or fever.   Assessment and Plan :   1. Dermatitis Persistent pruritic rash on left flank with new lesions and questionable purulent discharge. No significant response from Hydroxyzine and Diprolene Cream. Encouraged to  add H2 blocker and Domeboro compresses with this regimen. Will get wound culture and CBC to rule out secondary bacterial infection. Recheck pending report. - CBC with Differential/Platelet - Wound culture  Dortha Kern [facility] 08/22/2015 11:17 AM

## 2015-08-23 ENCOUNTER — Ambulatory Visit: Payer: BLUE CROSS/BLUE SHIELD | Admitting: Family Medicine

## 2015-08-25 ENCOUNTER — Encounter: Payer: Self-pay | Admitting: Emergency Medicine

## 2015-08-25 ENCOUNTER — Emergency Department
Admission: EM | Admit: 2015-08-25 | Discharge: 2015-08-25 | Disposition: A | Discharge status: Home / Self Care | Payer: BLUE CROSS/BLUE SHIELD | Attending: Emergency Medicine | Admitting: Emergency Medicine

## 2015-08-25 DIAGNOSIS — Z7952 Long term (current) use of systemic steroids: Secondary | ICD-10-CM | POA: Diagnosis not present

## 2015-08-25 DIAGNOSIS — Z793 Long term (current) use of hormonal contraceptives: Secondary | ICD-10-CM | POA: Diagnosis not present

## 2015-08-25 DIAGNOSIS — L298 Other pruritus: Secondary | ICD-10-CM | POA: Diagnosis present

## 2015-08-25 DIAGNOSIS — L259 Unspecified contact dermatitis, unspecified cause: Secondary | ICD-10-CM | POA: Diagnosis not present

## 2015-08-25 LAB — WOUND CULTURE: Organism ID, Bacteria: NONE SEEN

## 2015-08-25 MED ORDER — HYDROXYZINE HCL 25 MG PO TABS
25.0000 mg | ORAL_TABLET | Freq: Once | ORAL | Status: AC
  Administered 2015-08-25: 25 mg via ORAL
  Filled 2015-08-25: qty 1

## 2015-08-25 MED ORDER — DEXAMETHASONE SODIUM PHOSPHATE 10 MG/ML IJ SOLN
10.0000 mg | Freq: Once | INTRAMUSCULAR | Status: AC
  Administered 2015-08-25: 10 mg via INTRAMUSCULAR
  Filled 2015-08-25: qty 1

## 2015-08-25 MED ORDER — PREDNISONE 10 MG PO TABS: 60.0000 mg | ORAL_TABLET | Freq: Every day | ORAL | Status: DC

## 2015-08-25 MED ORDER — HYDROXYZINE HCL 25 MG PO TABS
25.0000 mg | ORAL_TABLET | Freq: Three times a day (TID) | ORAL | Status: DC | PRN
Start: 2015-08-25 — End: 2016-07-02

## 2015-08-25 NOTE — ED Provider Notes (Signed)
[facility] Emergency Department Provider Note ____________________________________________  Time seen: Approximately 10:14 AM  I have reviewed the triage vital signs and the nursing notes.   HISTORY  Chief Complaint Pruritis   HPI Misty Little is a 39 y.o. female who presents to the emergency department for evaluation of poison oak. She was helping her husband cut down a tree about 2 weeks ago and started noticing a itchy rash on her arms and stomach couple of days later. She has been to her primary care provider twice for the rash. He has not prescribed prednisone. The rash continues to itch.   Past Medical History  Diagnosis Date  . Sturge-Weber syndrome with glaucoma   . Infection     uti  . Anemia   . Headache(784.0)   . Seizures     "small ones as child"    Patient Active Problem List   Diagnosis Date Noted  . Absence of menstruation 08/19/2015  . Blood pressure elevated 08/19/2015  . Glaucoma 08/19/2015  . Personal history of disease of skin and subcutaneous tissue 08/19/2015  . Edema of foot 08/19/2015  . S/P cesarean section 09/06/2014  . Gestational hypertension 08/30/2014  . Dizziness 05/04/2014  . Obesity 04/03/2014  . Antepartum multigravida of advanced maternal age 77/09/2014  . 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 Rh negative state in antepartum period 01/03/2014    Past Surgical History  Procedure Laterality Date  . Eye surgery  1977  . Toe surgery  1977  . Cesarean section N/A 09/02/2014    Procedure: CESAREAN SECTION;  Surgeon: Catalina Antigua, MD;  Location: [facility] ORS;  Service: Obstetrics;  Laterality: N/A;  . Eye surgery Right 2010    glaucoma    Current Outpatient Rx  Name  Route  Sig  Dispense  Refill  . betamethasone dipropionate (DIPROLENE) 0.05 % cream   Topical   Apply topically 2 (two) times daily.   30 g   0   . hydrOXYzine (ATARAX/VISTARIL) 25 MG tablet   Oral   Take 1 tablet (25 mg total) by mouth 3 (three)  times daily as needed.   30 tablet   0   . IRON PO   Oral   Take by mouth.         . norgestimate-ethinyl estradiol (ORTHO-CYCLEN,SPRINTEC,PREVIFEM) 0.25-35 MG-MCG tablet   Oral   Take 1 tablet by mouth daily.   1 Package   11   . predniSONE (DELTASONE) 10 MG tablet   Oral   Take 6 tablets (60 mg total) by mouth daily.   63 tablet   0     Allergies Other  Family History  Problem Relation Age of Onset  . Heart disease Mother   . Diabetes Maternal Aunt   . Diabetes Maternal Grandmother   . Alzheimer's disease Maternal Grandmother   . Hearing loss Neg Hx   . Hypertension Father   . Healthy Son   . Alzheimer's disease Maternal Grandfather   . Parkinsonism Maternal Grandfather   . Healthy Sister     Social History Social History  Substance Use Topics  . Smoking status: Never Smoker   . Smokeless tobacco: Never Used  . Alcohol Use: No     Comment: occasional before pregnancy    Review of Systems   Constitutional: No fever/chills Eyes: No visual changes. ENT: No congestion or rhinorrhea Cardiovascular: Denies chest pain. Respiratory: Denies shortness of breath. Gastrointestinal: No abdominal pain.  No nausea, no vomiting.  No diarrhea.  No constipation. Genitourinary: Negative for dysuria. Musculoskeletal: Negative for back pain. Skin: Positive for rash Neurological: Negative for headaches, focal weakness or numbness.  10-point ROS otherwise negative.  ____________________________________________   PHYSICAL EXAM:  VITAL SIGNS: ED Triage Vitals  Enc Vitals Group     BP 08/25/15 0913 108/63 mmHg     Pulse Rate 08/25/15 0913 72     Resp 08/25/15 0913 20     Temp 08/25/15 0913 97.5 F (36.4 C)     Temp Source 08/25/15 0913 Oral     SpO2 08/25/15 0913 98 %     Weight 08/25/15 0911 239 lb (108.41 kg)     Height 08/25/15 0913  (1.575 m)     Head Cir --      Peak Flow --      Pain Score --      Pain Loc --      Pain Edu? --      Excl. in  GC? --     Constitutional: Alert and oriented. Well appearing and in no acute distress. Eyes: Conjunctivae are normal. PERRL. EOMI. Head: Atraumatic. Nose: No congestion/rhinnorhea. Mouth/Throat: Mucous membranes are moist.  Oropharynx non-erythematous. No oral lesions. Neck: No stridor. Cardiovascular: Normal rate, regular rhythm.  Good peripheral circulation. Respiratory: Normal respiratory effort.  No retractions. Lungs CTAB. Gastrointestinal: Soft and nontender. No distention. No abdominal bruits.  Musculoskeletal: No lower extremity tenderness nor edema.  No joint effusions. Neurologic:  Normal speech and language. No gross focal neurologic deficits are appreciated. Speech is normal. No gait instability. Skin:  Maculopapular erythematous rash around the abdomen and bilateral forearms, rash appears in linear patterns consistent with poison ivy; Negative for petechiae.  Psychiatric: Mood and affect are normal. Speech and behavior are normal.  ____________________________________________   LABS (all labs ordered are listed, but only abnormal results are displayed)  Labs Reviewed - No data to display ____________________________________________  EKG   ____________________________________________  RADIOLOGY   ____________________________________________   PROCEDURES  Procedure(s) performed: None ____________________________________________   INITIAL IMPRESSION / ASSESSMENT AND PLAN / ED COURSE  Pertinent labs & imaging results that were available during my care of the patient were reviewed by me and considered in my medical decision making (see chart for details).  I am Decadron given in the emergency department as well as by mouth hydroxyzine. She will start a taper dose of prednisone tomorrow and take hydroxyzine every 6 hours as needed for the itch. She was advised follow-up with her primary care provider for symptoms that are not improving over the week. She was  advised to return the emergency department for symptoms that change or worsen if she is unable schedule an appointment. ____________________________________________   FINAL CLINICAL IMPRESSION(S) / ED DIAGNOSES  Final diagnoses:  Contact dermatitis       Chinita Pester, FNP 08/25/15 1027  Sharman Cheek, MD 08/25/15 1553

## 2015-08-25 NOTE — ED Notes (Signed)
Developed itching and rash after working with husband cutting a tree

## 2015-08-30 ENCOUNTER — Telehealth: Payer: Self-pay

## 2015-08-30 NOTE — Telephone Encounter (Signed)
Patient advised as directed below. Patient verbalized understanding. Patient states the rash has went away now. Advised patient to call back if needed.

## 2015-08-30 NOTE — Telephone Encounter (Signed)
-----   Message from Tamsen Roers, [place] sent at 08/28/2015 10:59 AM EDT ----- Final report of culture showed moderate bacterial growth from rash. Hope the prednisone taper started at the ER has helped and not caused this to turn into a significant infection secondary to the poison ivy rash. Recommend she finish the prednisone taper and consider an antibiotic. Would like to see the rash next week.

## 2015-08-30 NOTE — Telephone Encounter (Signed)
LMTCB

## 2015-09-16 ENCOUNTER — Other Ambulatory Visit: Payer: Self-pay

## 2015-09-16 ENCOUNTER — Encounter: Payer: Self-pay | Admitting: Family Medicine

## 2015-09-16 ENCOUNTER — Ambulatory Visit (INDEPENDENT_AMBULATORY_CARE_PROVIDER_SITE_OTHER): Payer: BLUE CROSS/BLUE SHIELD | Admitting: Family Medicine

## 2015-09-16 VITALS — BP 136/84 | HR 80 | Temp 98.7°F | Resp 16 | Wt 245.6 lb

## 2015-09-16 DIAGNOSIS — L308 Other specified dermatitis: Secondary | ICD-10-CM

## 2015-09-16 DIAGNOSIS — L299 Pruritus, unspecified: Secondary | ICD-10-CM | POA: Diagnosis not present

## 2015-09-16 NOTE — Progress Notes (Signed)
Patient ID: Misty Little, female   DOB: Mar 20, 1976, 39 y.o.   MRN: [id number] Name: Misty Little   MRN: [id number]    DOB: 22-Feb-1976   Date:09/16/2015       Progress Note  Subjective  Chief Complaint  Chief Complaint  Patient presents with  . Poison Oak    Rash This is a recurrent problem. The problem is unchanged. The affected locations include the abdomen. The rash is characterized by itchiness and redness. She was exposed to plant contact. Past treatments include oral steroids, antihistamine and topical steroids. The treatment provided mild relief.   Patient Active Problem List   Diagnosis Date Noted  . Absence of menstruation 08/19/2015  . Blood pressure elevated 08/19/2015  . Glaucoma 08/19/2015  . Personal history of disease of skin and subcutaneous tissue 08/19/2015  . Edema of foot 08/19/2015  . S/P cesarean section 09/06/2014  . Gestational hypertension 08/30/2014  . Dizziness 05/04/2014  . Obesity 04/03/2014  . Antepartum multigravida of advanced maternal age 62/09/2014  . 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 Rh negative state in antepartum period 01/03/2014   Past Medical History  Diagnosis Date  . Sturge-Weber syndrome with glaucoma (HCC)   . Infection     uti  . Anemia   . Headache(784.0)   . Seizures (HCC)     "small ones as child"   Social History  Substance Use Topics  . Smoking status: Never Smoker   . Smokeless tobacco: Never Used  . Alcohol Use: No     Comment: occasional before pregnancy    Current outpatient prescriptions:  .  betamethasone dipropionate (DIPROLENE) 0.05 % cream, Apply topically 2 (two) times daily., Disp: 30 g, Rfl: 0 .  hydrOXYzine (ATARAX/VISTARIL) 25 MG tablet, Take 1 tablet (25 mg total) by mouth 3 (three) times daily as needed., Disp: 30 tablet, Rfl: 0 .  IRON PO, Take by mouth., Disp: , Rfl:  .  norgestimate-ethinyl estradiol (ORTHO-CYCLEN,SPRINTEC,PREVIFEM) 0.25-35 MG-MCG tablet, Take 1 tablet by mouth daily., Disp: 1  Package, Rfl: 11  Allergies  Allergen Reactions  . Other Other (See Comments)    Pt states that she is allergic to walnuts- Reaction:  Causes tongue to hurt.    Review of Systems  Constitutional: Negative.   HENT: Negative.   Eyes: Negative.   Respiratory: Negative.   Cardiovascular: Negative.   Gastrointestinal: Negative.   Genitourinary: Negative.   Musculoskeletal: Negative.   Skin: Positive for rash.  Neurological: Negative.   Endo/Heme/Allergies: Negative.   Psychiatric/Behavioral: Negative.    Objective  Filed Vitals:   09/16/15 1140  BP: 136/84  Pulse: 80  Temp: 98.7 F (37.1 C)  TempSrc: Oral  Resp: 16  Weight: 245 lb 9.6 oz (111.403 kg)   Physical Exam  Constitutional: She is oriented to person, place, and time and well-developed, well-nourished, and in no distress.  obesity  HENT:  Head: Normocephalic.  Eyes: Conjunctivae are normal.  Neck: Neck supple.  Cardiovascular: Normal rate and regular rhythm.   Pulmonary/Chest: Breath sounds normal.  Neurological: She is alert and oriented to person, place, and time.  Skin: Rash noted.  Pinpoint pink papules that are very pruritic around abdomen to lower back bilaterally.  Psychiatric: Affect and judgment normal.    Recent Results (from the past 2160 hour(s))  Wound culture     Status: None   Collection Time: 08/22/15 12:00 AM  Result Value Ref Range   Gram Stain Result Final report    Result 1  Comment     Comment: Rare white blood cells.   RESULT 2 No organisms seen    Aerobic Bacterial Culture Final report    Result 1 Routine flora     Comment: Moderate growth   Assessment & Plan  1. Pruritic dermatitis Flare over the past 2-3 days. Thought the original rash occurred after helping husband cut wood, but, continue to have recurrences. Was seen in the ER on 08-25-15 and given prednisone with hydroxyzine for itching. Seemed to improve but has returned now. May use the Atarax prn and will refer to  dermatology. Refuses prednisone this time. Recheck prn. - Ambulatory referral to Dermatology

## 2015-10-21 ENCOUNTER — Ambulatory Visit (INDEPENDENT_AMBULATORY_CARE_PROVIDER_SITE_OTHER): Payer: BLUE CROSS/BLUE SHIELD | Admitting: Family Medicine

## 2015-10-21 ENCOUNTER — Encounter: Payer: Self-pay | Admitting: Family Medicine

## 2015-10-21 VITALS — BP 124/90 | HR 84 | Temp 97.7°F | Resp 16 | Ht 62.25 in | Wt 245.6 lb

## 2015-10-21 DIAGNOSIS — Q8589 Other phakomatoses, not elsewhere classified: Secondary | ICD-10-CM

## 2015-10-21 DIAGNOSIS — Z8639 Personal history of other endocrine, nutritional and metabolic disease: Secondary | ICD-10-CM

## 2015-10-21 DIAGNOSIS — Z6841 Body Mass Index (BMI) 40.0 and over, adult: Secondary | ICD-10-CM

## 2015-10-21 DIAGNOSIS — Q858 Other phakomatoses, not elsewhere classified: Secondary | ICD-10-CM

## 2015-10-21 DIAGNOSIS — Z0000 Encounter for general adult medical examination without abnormal findings: Secondary | ICD-10-CM | POA: Diagnosis not present

## 2015-10-21 LAB — POCT URINALYSIS DIPSTICK
BILIRUBIN UA: NEGATIVE
Blood, UA: NEGATIVE
GLUCOSE UA: NEGATIVE
KETONES UA: NEGATIVE
Leukocytes, UA: NEGATIVE
Nitrite, UA: NEGATIVE
Protein, UA: NEGATIVE
SPEC GRAV UA: 1.02
Urobilinogen, UA: 0.2
pH, UA: 6

## 2015-10-21 MED ORDER — NORGESTIMATE-ETH ESTRADIOL 0.25-35 MG-MCG PO TABS: 1.0000 | ORAL_TABLET | Freq: Every day | ORAL | Status: DC

## 2015-10-21 NOTE — Progress Notes (Signed)
Patient ID: Misty Little, female   DOB: 04-Feb-1976, 39 y.o.   MRN: [id number]       Patient: Misty Little, Female    DOB: [birth year], 39 y.o.   MRN: [id number] Visit Date: 10/21/2015  Today's Provider: Dortha Kern, PA   Chief Complaint  Patient presents with  . Annual Exam   Subjective:    Annual physical exam Misty Little is a 39 y.o. female who presents today for health maintenance and complete physical. She feels well. She reports exercising none. She reports she is sleeping well (average 8 hours a night).  -----------------------------------------------------------------   Review of Systems  Constitutional: Positive for unexpected weight change.  Eyes: Negative.   Respiratory: Negative.   Cardiovascular: Negative.   Gastrointestinal: Negative.   Endocrine: Negative.   Genitourinary: Positive for genital sores.  Musculoskeletal: Positive for back pain.  Skin: Negative.   Allergic/Immunologic: Negative.   Neurological: Positive for dizziness and headaches.  Hematological: Negative.   Psychiatric/Behavioral: Negative.     Social History She  reports that she has never smoked. She has never used smokeless tobacco. She reports that she does not drink alcohol or use illicit drugs. Social History   Social History  . Marital Status: Married    Spouse Name: N/A  . Number of Children: N/A  . Years of Education: N/A   Social History Main Topics  . Smoking status: Never Smoker   . Smokeless tobacco: Never Used  . Alcohol Use: No     Comment: occasional before pregnancy  . Drug Use: No  . Sexual Activity: Yes    Birth Control/ Protection: None   Other Topics Concern  . None   Social History Narrative    Patient Active Problem List   Diagnosis Date Noted  . Absence of menstruation 08/19/2015  . Blood pressure elevated 08/19/2015  . Glaucoma 08/19/2015  . Personal history of disease of skin and subcutaneous tissue 08/19/2015  . Edema of foot 08/19/2015  . S/P  cesarean section 09/06/2014  . Gestational hypertension 08/30/2014  . Dizziness 05/04/2014  . Obesity 04/03/2014  . Antepartum multigravida of advanced maternal age 32/09/2014  . 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 Rh negative state in antepartum period 01/03/2014    Past Surgical History  Procedure Laterality Date  . Eye surgery  1977  . Toe surgery  1977  . Cesarean section N/A 09/02/2014    Procedure: CESAREAN SECTION;  Surgeon: Catalina Antigua, MD;  Location: [facility] ORS;  Service: Obstetrics;  Laterality: N/A;  . Eye surgery Right 2010    glaucoma    Family History  Family Status  Relation Status Death Age  . Mother Alive   . Maternal Grandmother Deceased   . Father Alive   . Sister Deceased   . Son Alive   . Maternal Grandfather Deceased   . Paternal Grandmother Alive   . Paternal Grandfather Deceased   . Sister Alive   . Sister Alive    Her family history includes Alzheimer's disease in her maternal grandfather and maternal grandmother; Diabetes in her maternal aunt and maternal grandmother; Healthy in her sister and son; Heart disease in her mother; Hypertension in her father; Parkinsonism in her maternal grandfather. There is no history of Hearing loss.    Allergies  Allergen Reactions  . Other Other (See Comments)    Pt states that she is allergic to walnuts- Reaction:  Causes tongue to hurt.     Previous Medications   HYDROXYZINE (ATARAX/VISTARIL)  25 MG TABLET    Take 1 tablet (25 mg total) by mouth 3 (three) times daily as needed.   IRON PO    Take by mouth.   NORGESTIMATE-ETHINYL ESTRADIOL (ORTHO-CYCLEN,SPRINTEC,PREVIFEM) 0.25-35 MG-MCG TABLET    Take 1 tablet by mouth daily.    Patient Care Team: Tamsen Roers, PA as PCP - General (Physician Assistant)     Objective:   Vitals: BP 124/90 mmHg  Pulse 84  Temp(Src) 97.7 F (36.5 C) (Oral)  Resp 16  Ht 5' 2.25" (1.581 m)  Wt 245 lb 9.6 oz (111.403 kg)  BMI 44.57 kg/m2  SpO2 97%  LMP  09/23/2015 (Approximate)  Wt Readings from Last 3 Encounters:  10/21/15 245 lb 9.6 oz (111.403 kg)  09/16/15 245 lb 9.6 oz (111.403 kg)  08/25/15 240 lb (108.863 kg)   Temp Readings from Last 3 Encounters:  10/21/15 97.7 F (36.5 C) Oral  09/16/15 98.7 F (37.1 C) Oral  08/25/15 97.5 F (36.4 C) Oral   BP Readings from Last 3 Encounters:  10/21/15 124/90  09/16/15 136/84  08/25/15 108/63   Pulse Readings from Last 3 Encounters:  10/21/15 84  09/16/15 80  08/25/15 72    Physical Exam  Constitutional: She is oriented to person, place, and time. She appears well-developed and well-nourished.  HENT:  Head: Normocephalic and atraumatic.  Right Ear: External ear normal.  Left Ear: External ear normal.  Nose: Nose normal.  Mouth/Throat: Oropharynx is clear and moist.  Eyes: Conjunctivae and EOM are normal. Pupils are equal, round, and reactive to light. Right eye exhibits no discharge.  Neck: Normal range of motion. Neck supple. No tracheal deviation present. No thyromegaly present.  Cardiovascular: Normal rate, regular rhythm, normal heart sounds and intact distal pulses.   No murmur heard. Pulmonary/Chest: Effort normal and breath sounds normal. No respiratory distress. She has no wheezes. She has no rales. She exhibits no tenderness.  Abdominal: Soft. She exhibits no distension and no mass. There is no tenderness. There is no rebound and no guarding.  Genitourinary: Vagina normal and uterus normal.  Musculoskeletal: Normal range of motion. She exhibits no edema or tenderness.  Lymphadenopathy:    She has no cervical adenopathy.  Neurological: She is alert and oriented to person, place, and time. She has normal reflexes. No cranial nerve deficit. She exhibits normal muscle tone. Coordination normal.  Skin: Skin is warm and dry. No erythema.  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  Psychiatric: She has a normal mood and affect. Her behavior is normal. Judgment  and thought content normal.     Depression Screen PHQ 2/9 Scores 07/17/2014 06/12/2014 05/15/2014  PHQ - 2 Score 0 0 0      Assessment & Plan:     Routine Health Maintenance and Physical Exam  Exercise Activities and Dietary recommendations Goals    Recommend 30 minute exercise 3-4 times a week and weight loss diet.      Immunization History  Administered Date(s) Administered  . Influenza Split 09/13/2013, 07/31/2014  . Influenza-Unspecified 08/29/2015  . Tdap 07/31/2014    Health Maintenance  Topic Date Due  . INFLUENZA VACCINE  06/30/2016  . PAP SMEAR  02/07/2017  . TETANUS/TDAP  07/31/2024  . HIV Screening  Completed      Discussed health benefits of physical activity, and encouraged her to engage in regular exercise appropriate for her age and condition.    -------------------------------------------------------------------- 1. Annual physical exam General health good. Last  Tdap was 07-31-14 and flu shot accomplished at work October 2016. LMP 09-23-15 and requests refill of Sprintc. Has noticed some headache with BCP on the 3rd week of her cycle (headaches stopped during last pregnancy). Will recheck routine labs and Pap obtained. - POCT urinalysis dipstick - norgestimate-ethinyl estradiol (ORTHO-CYCLEN,SPRINTEC,PREVIFEM) 0.25-35 MG-MCG tablet; Take 1 tablet by mouth daily.  Dispense: 3 Package; Refill: 4 - Pap IG (Image Guided)  2. Sturge-Weber syndrome (HCC) Unchanged.  3. Hx of iron deficiency - CBC with Differential/Platelet - Ferritin  4. Morbid obesity with body mass index (BMI) of 40.0 to 44.9 in adult [facility](HCC) Will check labs and encouraged to exercise regularly and start a weight loss diet. May need referral to bariatric clinic pending reports and follow up assessment in a month. - COMPLETE METABOLIC PANEL WITH GFR - TSH - Lipid panel - POCT glycosylated hemoglobin (Hb A1C)

## 2015-10-23 LAB — PAP IG (IMAGE GUIDED): PAP SMEAR COMMENT: 0

## 2015-10-28 ENCOUNTER — Telehealth: Payer: Self-pay

## 2015-10-28 NOTE — Telephone Encounter (Signed)
-----   Message from Dennis E Chrismon, PA sent at 10/28/2015  5:17 AM EST ----- Normal PAP test. No sign of malignancy. Recheck annually. 

## 2015-10-28 NOTE — Telephone Encounter (Signed)
Patient's husband Jill AlexandersJustin advised as directed below.

## 2015-10-28 NOTE — Telephone Encounter (Signed)
-----   Message from Tamsen Roersennis E Chrismon, [place] sent at 10/28/2015  5:17 AM EST ----- Normal PAP test. No sign of malignancy. Recheck annually.

## 2015-10-28 NOTE — Telephone Encounter (Signed)
Patient's husband Justin advised as directed below.  

## 2016-07-02 ENCOUNTER — Ambulatory Visit (INDEPENDENT_AMBULATORY_CARE_PROVIDER_SITE_OTHER): Payer: BLUE CROSS/BLUE SHIELD | Admitting: Family Medicine

## 2016-07-02 ENCOUNTER — Encounter: Payer: Self-pay | Admitting: Family Medicine

## 2016-07-02 VITALS — BP 126/68 | HR 80 | Temp 98.8°F | Resp 16 | Wt 245.0 lb

## 2016-07-02 DIAGNOSIS — G43829 Menstrual migraine, not intractable, without status migrainosus: Secondary | ICD-10-CM | POA: Diagnosis not present

## 2016-07-02 DIAGNOSIS — Q8589 Other phakomatoses, not elsewhere classified: Secondary | ICD-10-CM

## 2016-07-02 DIAGNOSIS — N943 Premenstrual tension syndrome: Secondary | ICD-10-CM | POA: Diagnosis not present

## 2016-07-02 DIAGNOSIS — Q858 Other phakomatoses, not elsewhere classified: Secondary | ICD-10-CM

## 2016-07-02 MED ORDER — SUMATRIPTAN SUCCINATE 50 MG PO TABS: ORAL_TABLET | ORAL | 0 refills | Status: DC

## 2016-07-02 NOTE — Progress Notes (Signed)
Patient: Misty Little Female    DOB: [birth year]   40 y.o.   MRN: [id number] Visit Date: 07/02/2016  Today's Provider: Dortha Kern, PA   Chief Complaint  Patient presents with  . Migraine    X 2 days.    Subjective:    HPI Patient reports that she has had a migraine headache for the last 2 days. She reports that the pain is constant. She reports that the pain is located more on her right side behind her eye. Patient has been taking Goody powders for the pain. She reports that it "takes the edge off", but her headache is still there. Occurs once a month just before menses starts and last 1-3 days. Usually right side of forehead with some tenderness of right eye. Sensitive to light and slight nausea but no vomiting. Headache described as a throbbing pain.   Patient Active Problem List   Diagnosis Date Noted  . Absence of menstruation 08/19/2015  . Blood pressure elevated 08/19/2015  . Glaucoma 08/19/2015  . Personal history of disease of skin and subcutaneous tissue 08/19/2015  . Edema of foot 08/19/2015  . S/P cesarean section 09/06/2014  . Gestational hypertension 08/30/2014  . Dizziness 05/04/2014  . Obesity 04/03/2014  . Antepartum multigravida of advanced maternal age 20/09/2014  . 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 Rh negative state in antepartum period 01/03/2014   Past Surgical History:  Procedure Laterality Date  . CESAREAN SECTION N/A 09/02/2014   Procedure: CESAREAN SECTION;  Surgeon: Catalina Antigua, MD;  Location: [facility];  Service: Obstetrics;  Laterality: N/A;  . EYE SURGERY  1977  . EYE SURGERY Right 2010   glaucoma  . TOE SURGERY  1977   Family History  Problem Relation Age of Onset  . Heart disease Mother   . Diabetes Maternal Aunt   . Diabetes Maternal Grandmother   . Alzheimer's disease Maternal Grandmother   . Hearing loss Neg Hx   . Hypertension Father   . Healthy Son   . Alzheimer's disease Maternal Grandfather   . Parkinsonism  Maternal Grandfather   . Healthy Sister     Allergies  Allergen Reactions  . Other Other (See Comments)    Pt states that she is allergic to walnuts- Reaction:  Causes tongue to hurt.    Current Meds  Medication Sig  . IRON PO Take by mouth.  . norgestimate-ethinyl estradiol (ORTHO-CYCLEN,SPRINTEC,PREVIFEM) 0.25-35 MG-MCG tablet Take 1 tablet by mouth daily.  . [DISCONTINUED] hydrOXYzine (ATARAX/VISTARIL) 25 MG tablet Take 1 tablet (25 mg total) by mouth 3 (three) times daily as needed.    Review of Systems  Constitutional: Negative.   Respiratory: Negative.   Cardiovascular: Negative.   Skin: Positive for color change.  Neurological: Positive for dizziness, light-headedness and headaches.    Social History  Substance Use Topics  . Smoking status: Never Smoker  . Smokeless tobacco: Never Used  . Alcohol use No     Comment: occasional before pregnancy   Objective:   BP 126/68 (BP Location: Right Arm, Patient Position: Sitting, Cuff Size: Large)   Pulse 80   Temp 98.8 F (37.1 C)   Resp 16   Wt 245 lb (111.1 kg)   LMP 07/01/2016 (Exact Date)   Breastfeeding? No   BMI 44.45 kg/m   Physical Exam  Constitutional: She is oriented to person, place, and time. She appears well-developed and well-nourished.  HENT:  Head: Normocephalic.  Nose: Nose normal.  Mouth/Throat:  Oropharynx is clear and moist.  Port wine stain right side of face. Photosensitivity today with right frontal throbbing/pounding headache.   Eyes: EOM are normal.  Migraine with photosensitivity.  Neck: Neck supple.  Cardiovascular: Normal rate and regular rhythm.   Pulmonary/Chest: Effort normal and breath sounds normal.  Abdominal: Bowel sounds are normal.  Neurological: She is alert and oriented to person, place, and time.      Assessment & Plan:     1. Menstrual migraine without status migrainosus, not intractable Onset the last 2 days in the right frontal region where her "port wine stain"  lesion is. No vomiting but feeling slight nausea and photosensitive today. Seems to occur just before menses each month. Will give Imitrex and may add Aleve 2 tablets at onset. If no better, will need to consider change to Tri-phasic BCP. Plans CPE/contraceptive management visit in November 2017. - SUMAtriptan (IMITREX) 50 MG tablet; May repeat in 2 hours if headache persists or recurs.  Dispense: 10 tablet; Refill: 0  2. 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 Not on any glaucoma medications.        Dortha Kern, PA  [facility]

## 2016-07-10 IMAGING — US US OB FOLLOW-UP
1 series · 12 of 28 positions shown · non-contrast
Comparison: none

[Series 1: us ob follow-up · 0.32mm/px · 12 of 38 slices shown]
[im 2/38]
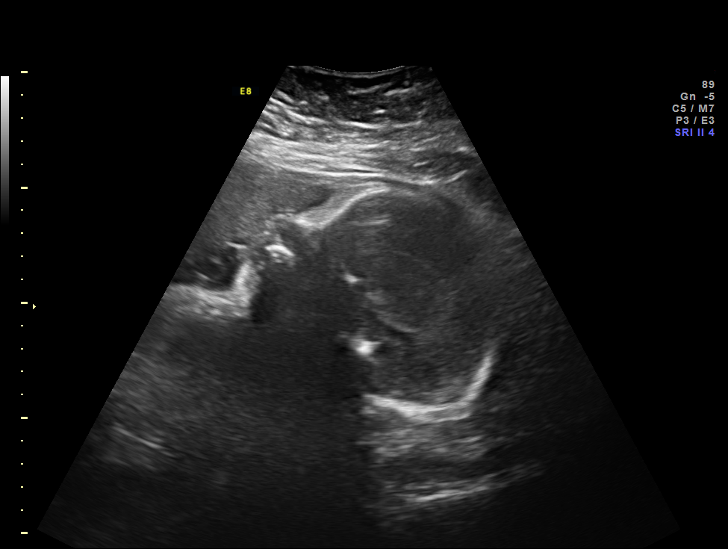
[im 5/38]
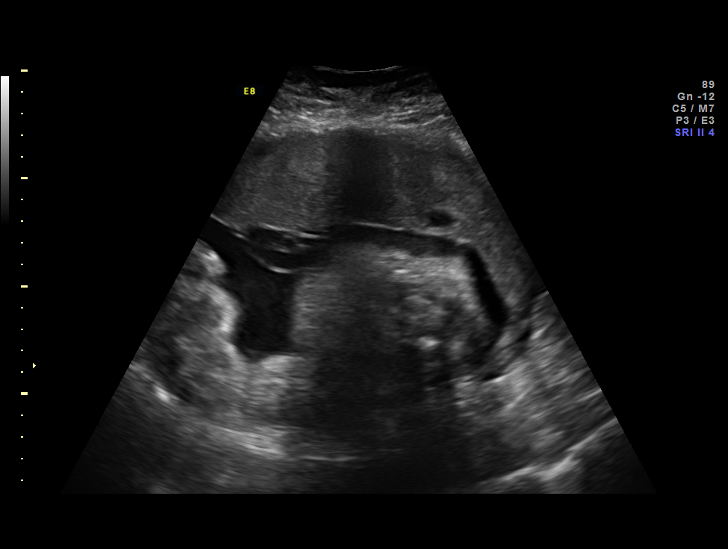
[im 7/38]
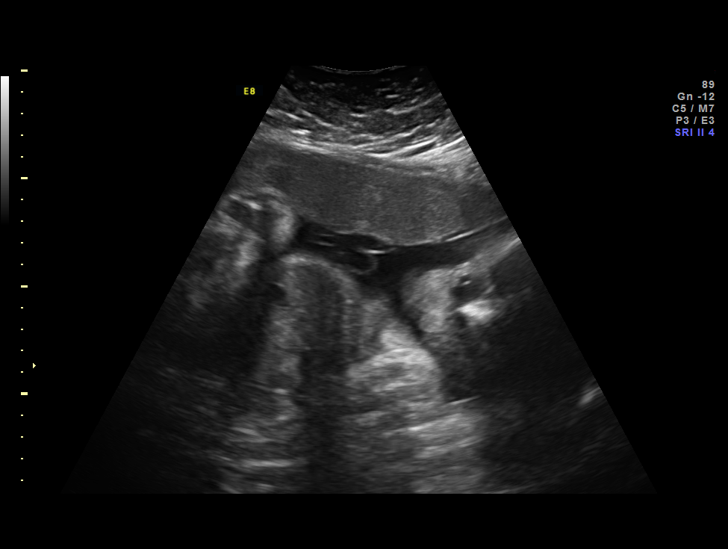
[im 11/38]
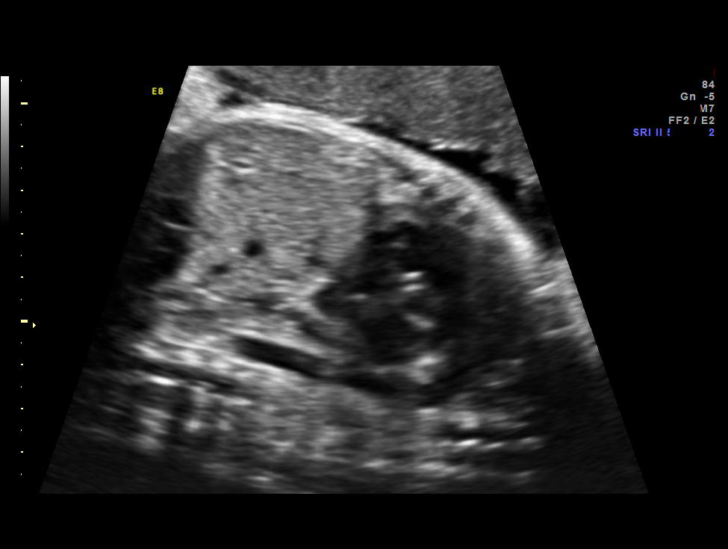
[im 14/38]
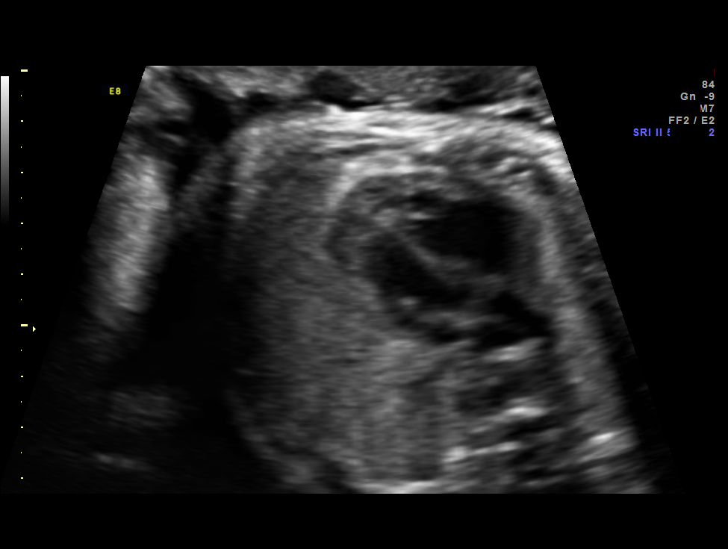
[im 17/38]
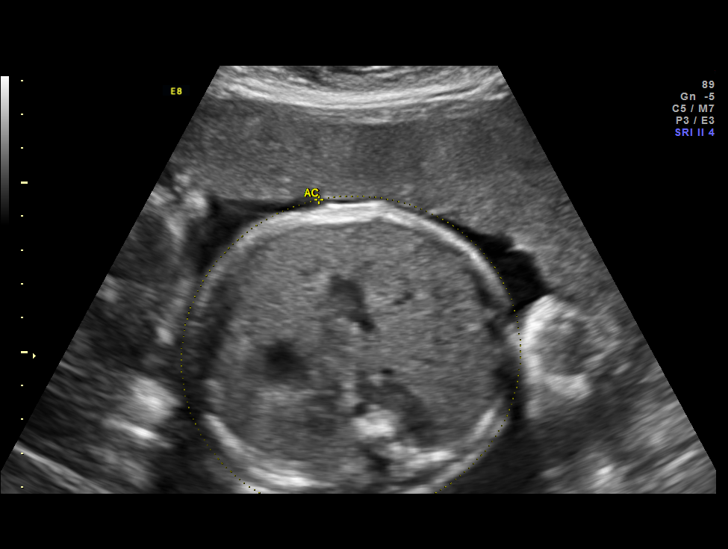
[im 21/38]
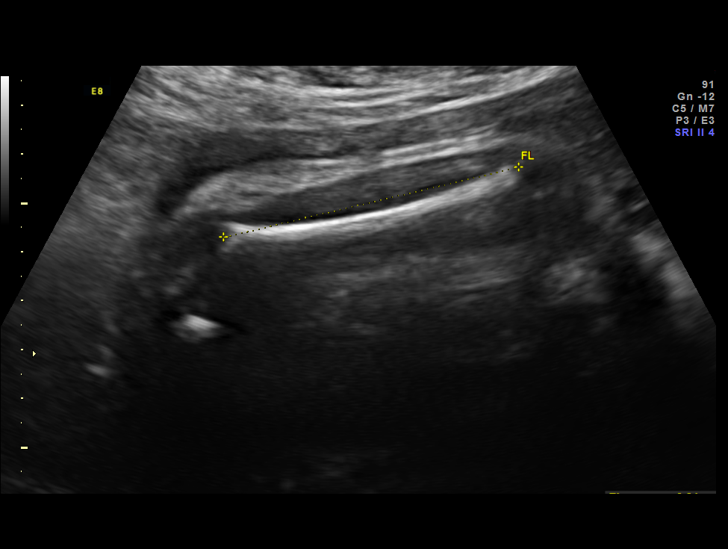
[im 24/38]
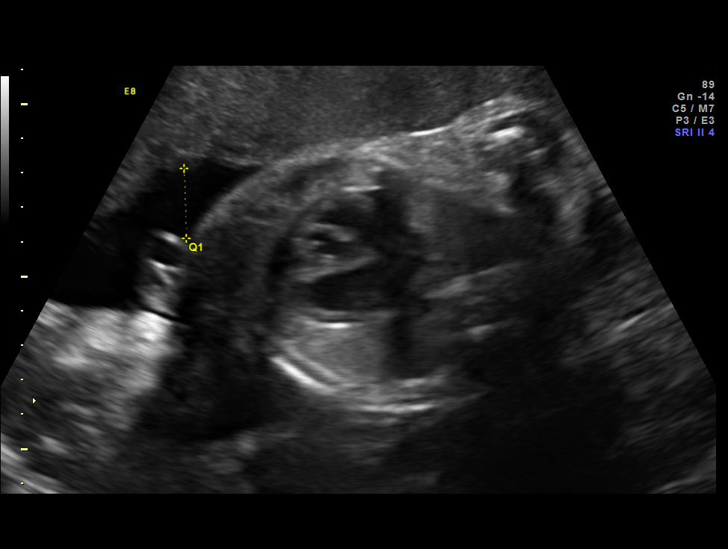
[im 27/38]
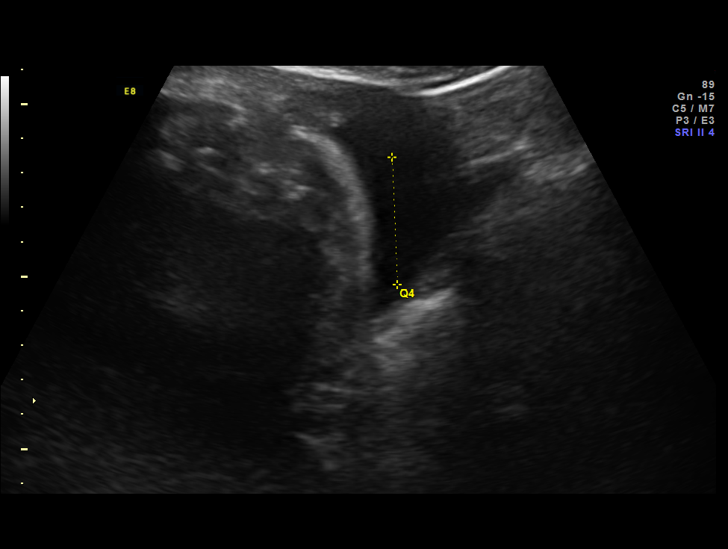
[im 31/38]
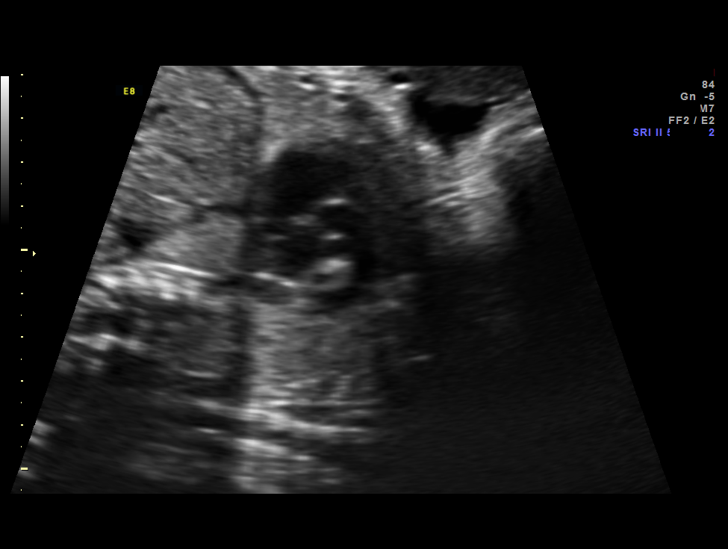
[im 33/38]
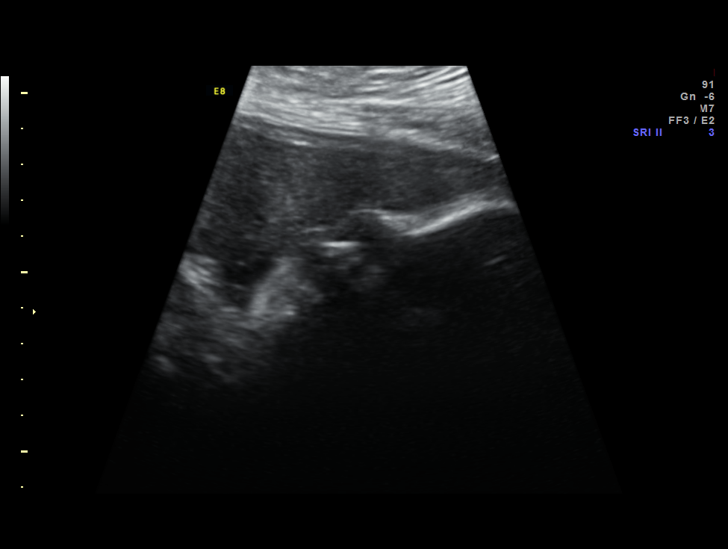
[im 36/38]
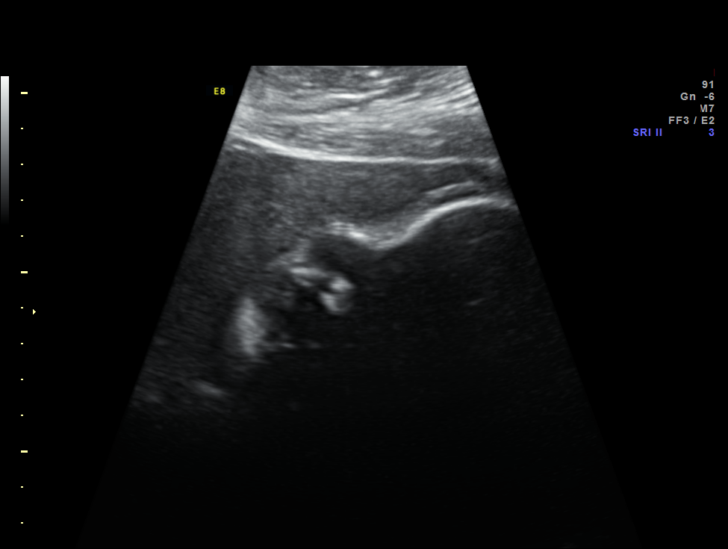

[12 of 28 positions shown; findings below may reference images not displayed]

OBSTETRICS REPORT
                      (Signed Final 07/17/2014 [DATE])

Service(s) Provided

 US OB FOLLOW UP                                       76816.1
Indications

 Advanced maternal age (38), Multigravida - low
 risk NIPS
 History of genetic / anatomic abnormality -
 personal: Sturge-Weber syndrome
 Maternal obesity
Fetal Evaluation

 Num Of Fetuses:    1
 Fetal Heart Rate:  145                          bpm
 Cardiac Activity:  Observed
 Presentation:      Cephalic
 Placenta:          Anterior, above cervical os
 P. Cord            Previously Visualized
 Insertion:

 Amniotic Fluid
 AFI FV:      Subjectively within normal limits
 AFI Sum:     16.06   cm       58  %Tile     Larg Pckt:    5.62  cm
 RUQ:   2.03    cm   RLQ:    3.7    cm    LUQ:   5.62    cm   LLQ:    4.71   cm
Biometry

 BPD:     88.2  mm     G. Age:  35w 5d                CI:         77.9   70 - 86
 OFD:    113.2  mm                                    FL/HC:      19.7   19.4 -

 HC:     318.9  mm     G. Age:  35w 6d       70  %    HC/AC:      1.04   0.96 -

 AC:     306.8  mm     G. Age:  34w 5d       78  %    FL/BPD:     71.1   71 - 87
 FL:      62.7  mm     G. Age:  32w 3d       13  %    FL/AC:      20.4   20 - 24
 HUM:     54.7  mm     G. Age:  31w 6d       19  %

 Est. FW:    8633  gm      5 lb 4 oz     70  %
Gestational Age

 LMP:           33w 5d        Date:  11/23/13                 EDD:   08/30/14
 U/S Today:     34w 5d                                        EDD:   08/23/14
 Best:          33w 5d     Det. By:  LMP  (11/23/13)          EDD:   08/30/14
Anatomy

 Cranium:          Previously seen        Aortic Arch:      Not well visualized
 Fetal Cavum:      Previously seen        Ductal Arch:      Not well visualized
 Ventricles:       Appears normal         Diaphragm:        Appears normal
 Choroid Plexus:   Previously seen        Stomach:          Appears normal, left
                                                            sided
 Cerebellum:       Previously seen        Abdomen:          Previously seen
 Posterior Fossa:  Previously seen        Abdominal Wall:   Previously seen
 Nuchal Fold:      Previously seen        Cord Vessels:     Previously seen
 Face:             Orbits previously      Kidneys:          Appear normal
                   seen
 Lips:             Previously seen        Bladder:          Appears normal
 Heart:            Appears normal         Spine:            Previously seen
                   (4CH, axis, and
                   situs)
 RVOT:             Previously seen        Lower             Previously seen
                                          Extremities:
 LVOT:             Previously seen        Upper             Previously seen
                                          Extremities:

 Other:  Male gender.
Cervix Uterus Adnexa

 Cervix:       Not visualized (advanced GA >09wks)
Impression

 SIUP at 33+5 weeks
 Normal interval anatomy; anatomic survey complete except
 for arches
 Normal amniotic fluid volume
 Appropriate interval growth with EFW at the 70th %tile
Recommendations

 Follow-up as clinically indicated

 questions or concerns.

## 2016-07-31 IMAGING — MR MR LUMBAR SPINE W/O CM
5 of 13 series · 19 of 48 positions shown · non-contrast
Comparison: None.

CLINICAL DATA: Sturge-Weber syndrome. Rule out spinal abnormality
prior to spinal anesthesia for delivery. Thirty-seven weeks
pregnant.

EXAM:
MRI THORACIC AND LUMBAR SPINE WITHOUT CONTRAST
TECHNIQUE: Multiplanar and multiecho pulse sequences of the thoracic and lumbar
spine were obtained without intravenous contrast.

[Series 2: T1 · sagittal · 3.0mm · 0.98mm/px · 2 of 9 slices shown]
[im 1/9]
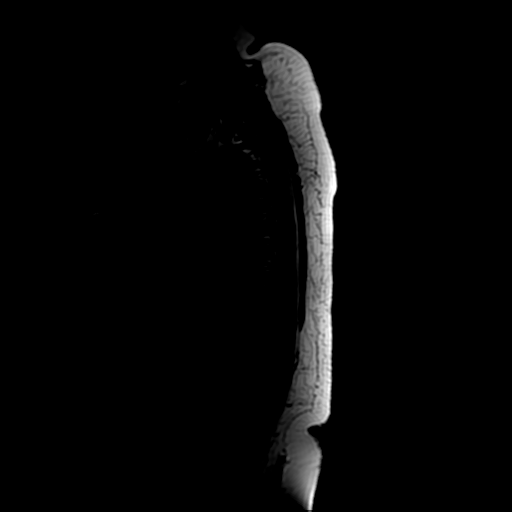
[im 5/9]
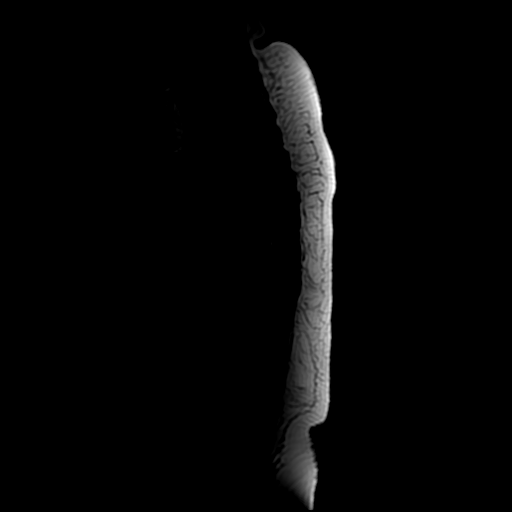

[Series 600: T2 · sagittal · 3.0mm · 0.66mm/px · 2 of 13 slices shown (1 of 4)]
[im 1/13]
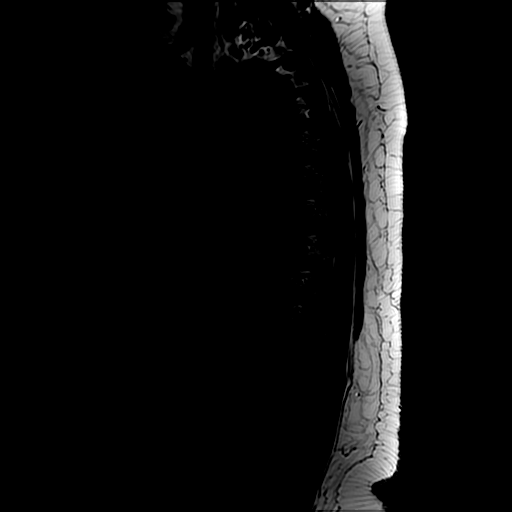
[im 13/13]
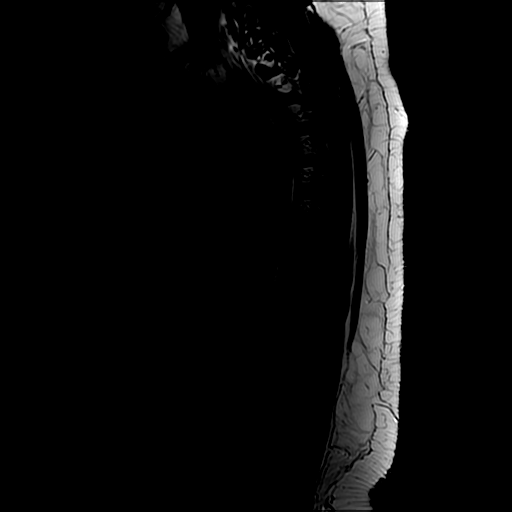

[Series 800: T2 · axial · 4.0mm · 0.39mm/px · z∈[-206,+17]mm · 7 of 38 slices shown (2 of 4)]
[im 1/38]
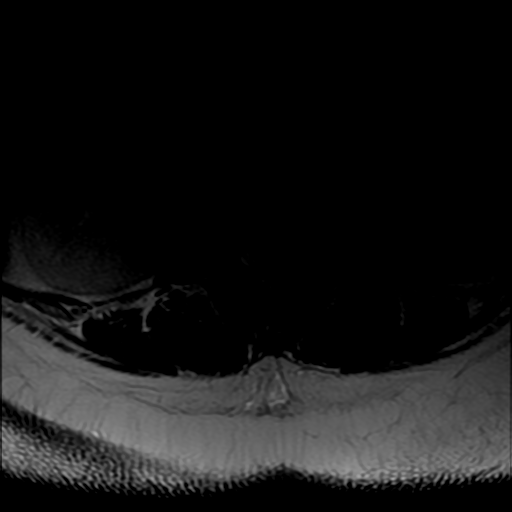
[im 7/38]
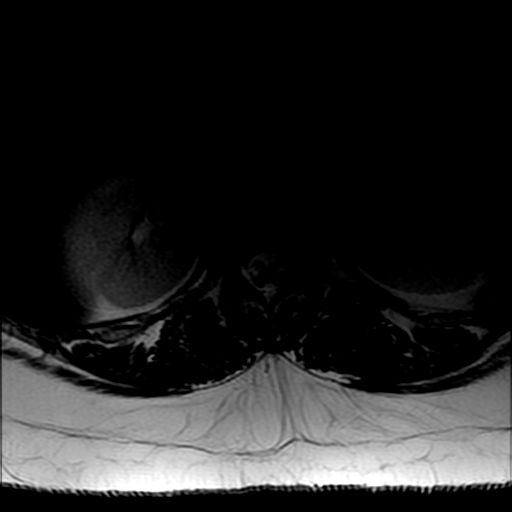
[im 13/38]
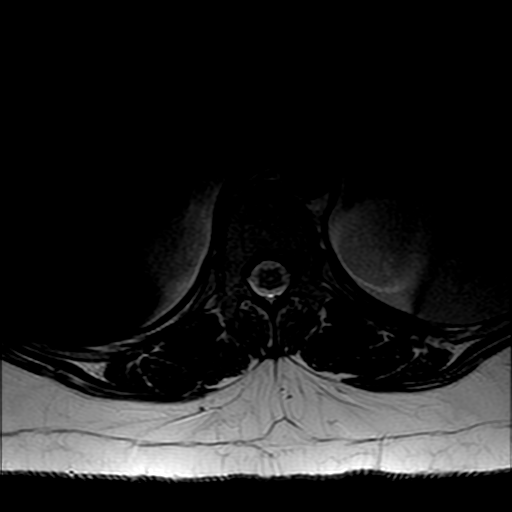
[im 19/38]
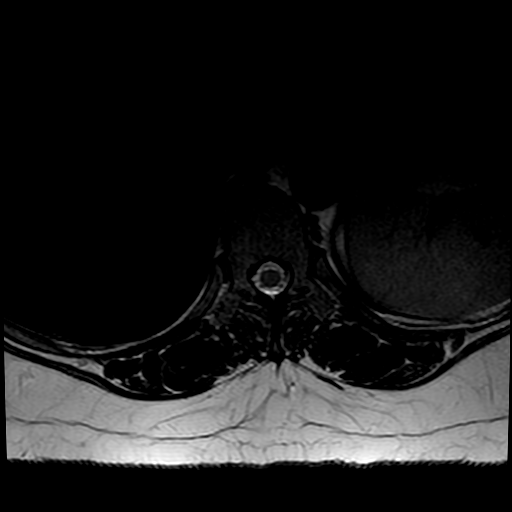
[im 25/38]
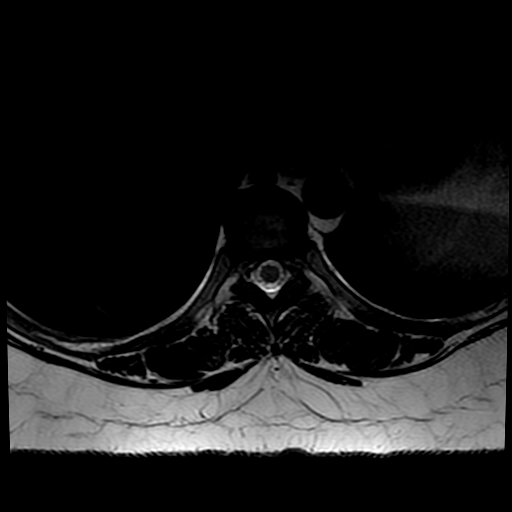
[im 31/38]
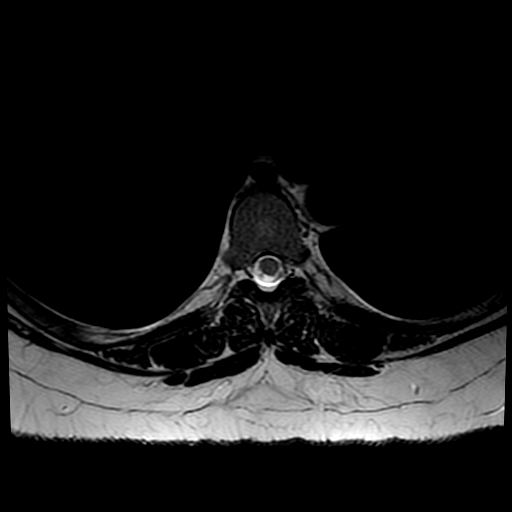
[im 38/38]
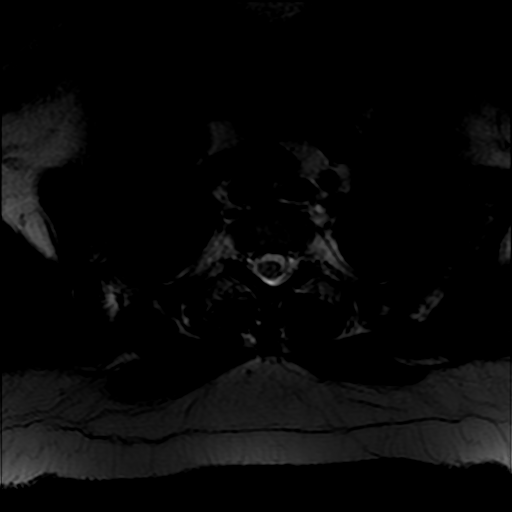

[Series 1200: T2 · sagittal · 4.0mm · 0.59mm/px · 2 of 13 slices shown (3 of 4)]
[im 1/13]
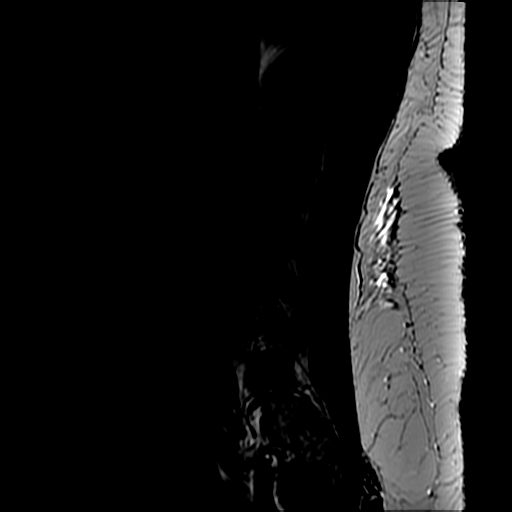
[im 13/13]
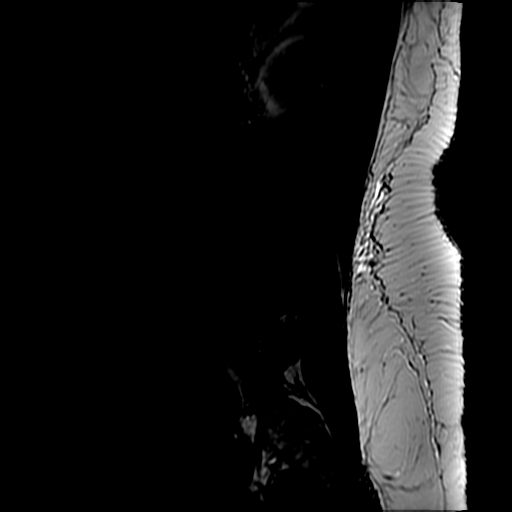

[Series 1600: T2 · axial · 4.5mm · 0.43mm/px · z∈[-382,-176]mm · 6 of 36 slices shown (4 of 4)]
[im 1/36]
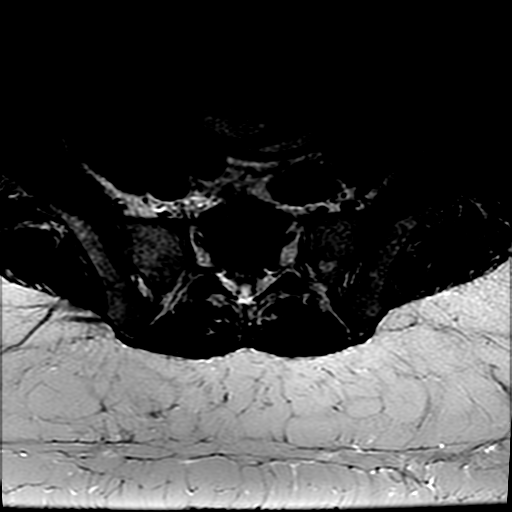
[im 8/36]
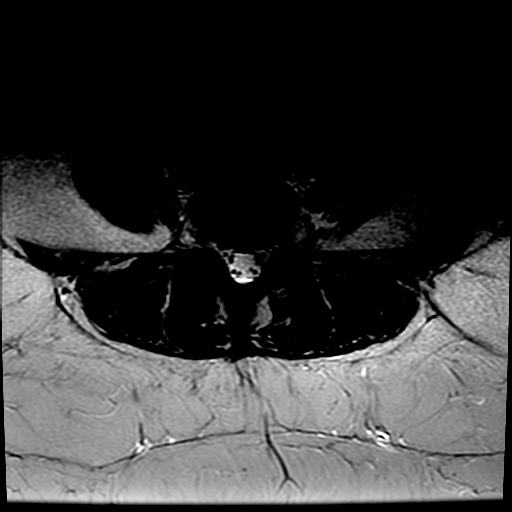
[im 15/36]
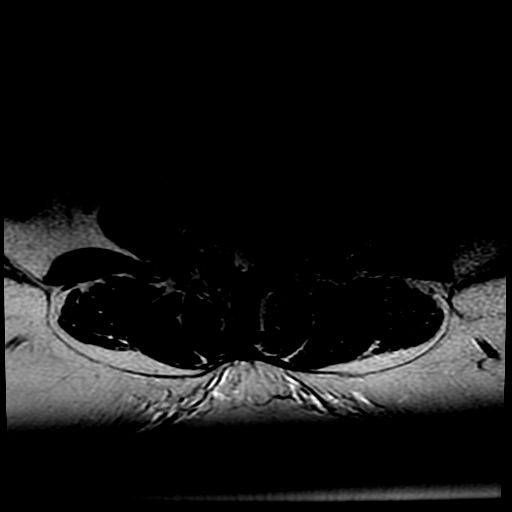
[im 22/36]
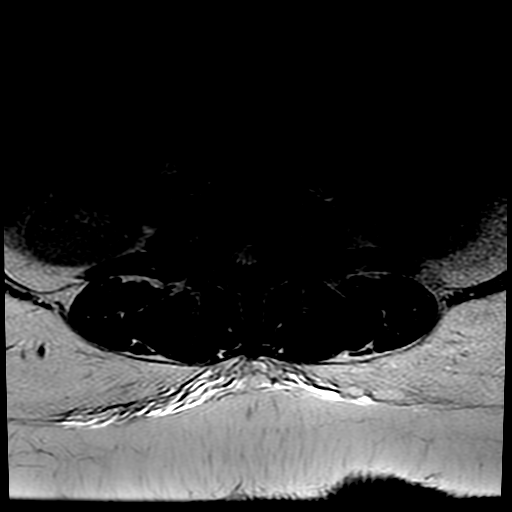
[im 29/36]
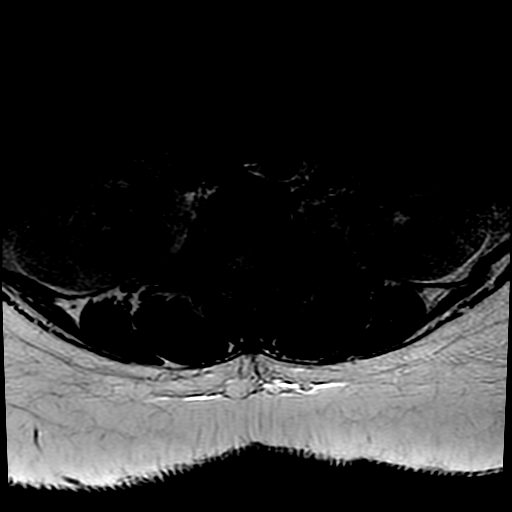
[im 36/36]
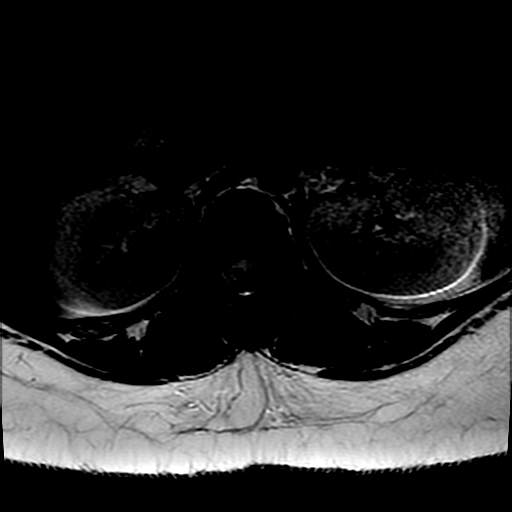

[19 of 48 positions shown; findings below may reference images not displayed]

FINDINGS: MR THORACIC SPINE FINDINGS

Normal thoracic alignment. Negative for fracture or mass. Spinal
cord signal is normal. Cord is normal in size and morphology. No
infarct mass or edema in the cord. No cord compression. No evidence
of AVM or other vascular malformation on unenhanced images.

Disc spaces are normal. No disc protrusion or spinal stenosis is
identified in the thoracic spine.

MR LUMBAR SPINE FINDINGS

Image quality limited due to difficulty with coil placement due to
gravid uterus.

L5 is partially incorporated into the sacrum. Conus medullaris is
normal and terminates at T12-L1.

Normal lumbar alignment. Negative for fracture or mass. Negative for
disc protrusion. Mild facet degeneration L4-5 and L3-4.

No evidence of spinal vascular malformation in the lumbar spine.
IMPRESSION: MR THORACIC SPINE IMPRESSION

Normal

MR LUMBAR SPINE IMPRESSION

Mild facet degeneration at L3-4 L4-5. No disc protrusion. Negative
for lumbar spine vascular malformation.

Gravid uterus.

## 2016-08-06 ENCOUNTER — Ambulatory Visit: Payer: BLUE CROSS/BLUE SHIELD | Admitting: Family Medicine

## 2016-08-27 ENCOUNTER — Other Ambulatory Visit: Payer: Self-pay | Admitting: Family Medicine

## 2016-08-27 DIAGNOSIS — G43829 Menstrual migraine, not intractable, without status migrainosus: Secondary | ICD-10-CM

## 2016-11-24 ENCOUNTER — Other Ambulatory Visit: Payer: Self-pay | Admitting: Family Medicine

## 2016-11-24 DIAGNOSIS — G43829 Menstrual migraine, not intractable, without status migrainosus: Secondary | ICD-10-CM

## 2016-11-25 NOTE — Telephone Encounter (Signed)
[email or web address]

## 2016-11-25 NOTE — Telephone Encounter (Signed)
Last ov 07/02/16; last filled 08/27/16. Please review. Thank you. sd

## 2016-11-26 ENCOUNTER — Encounter: Payer: Self-pay | Admitting: Physician Assistant

## 2016-11-26 ENCOUNTER — Ambulatory Visit (INDEPENDENT_AMBULATORY_CARE_PROVIDER_SITE_OTHER): Payer: BLUE CROSS/BLUE SHIELD | Admitting: Physician Assistant

## 2016-11-26 VITALS — BP 110/74 | HR 68 | Temp 97.8°F | Resp 16 | Ht 61.0 in | Wt 224.0 lb

## 2016-11-26 DIAGNOSIS — IMO0001 Reserved for inherently not codable concepts without codable children: Secondary | ICD-10-CM

## 2016-11-26 DIAGNOSIS — Z6841 Body Mass Index (BMI) 40.0 and over, adult: Secondary | ICD-10-CM

## 2016-11-26 DIAGNOSIS — G43829 Menstrual migraine, not intractable, without status migrainosus: Secondary | ICD-10-CM

## 2016-11-26 DIAGNOSIS — E669 Obesity, unspecified: Secondary | ICD-10-CM

## 2016-11-26 DIAGNOSIS — Z0000 Encounter for general adult medical examination without abnormal findings: Secondary | ICD-10-CM | POA: Diagnosis not present

## 2016-11-26 DIAGNOSIS — Z308 Encounter for other contraceptive management: Secondary | ICD-10-CM

## 2016-11-26 DIAGNOSIS — Q8589 Other phakomatoses, not elsewhere classified: Secondary | ICD-10-CM

## 2016-11-26 DIAGNOSIS — Q858 Other phakomatoses, not elsewhere classified: Secondary | ICD-10-CM

## 2016-11-26 MED ORDER — NORGESTIMATE-ETH ESTRADIOL 0.25-35 MG-MCG PO TABS: 1.0000 | ORAL_TABLET | Freq: Every day | ORAL | 11 refills | Status: DC

## 2016-11-26 MED ORDER — NORGESTIMATE-ETH ESTRADIOL 0.25-35 MG-MCG PO TABS: ORAL_TABLET | ORAL | 3 refills | Status: DC

## 2016-11-26 MED ORDER — LEVONORGEST-ETH ESTRAD 91-DAY 0.15-0.03 &0.01 MG PO TABS: 1.0000 | ORAL_TABLET | Freq: Every day | ORAL | 4 refills | Status: DC

## 2016-11-26 MED ORDER — SUMATRIPTAN SUCCINATE 50 MG PO TABS: ORAL_TABLET | ORAL | 0 refills | Status: DC

## 2016-11-26 NOTE — Progress Notes (Signed)
Patient: Misty Little, Female    DOB: [birth year], 40 y.o.   MRN: [id number] Visit Date: 11/26/2016  Today's Provider: Trinna Post, PA-C   Chief Complaint  Patient presents with  . Annual Exam   Subjective:    Annual physical exam   Misty Little is a 40 y.o. female who presents today for health maintenance and complete physical. She feels well. She reports exercising on the weekends. She reports she is sleeping well.  Patient has lost 20 pounds since last physical exam. Her BMI has decreased from 44 to 42. She has been doing the [employer] which consists of small balanced meals and also works out at [employer].  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 She is married and has been for the past 5 years. She has a two year old son and a 42 year old step daughter.   She does not and has never smoked. She drinks 1-2 12 oz beers per week.   She has a history of menstrual migraines for which she was seen in March. She reports getting a Migraine around the time of her placebo week, as she is currently on Sprintec. Last menstrual period one week ago. Has been taking Imitrex for this, once-twice per month. Discussed managing birth control options for her to reduce migraines at last visit. She reports her headaches are right sided and throbbing, she is photophobic and phonophobic and nauseas during them. Denies aura. She does not smoke. No history of clotting disorder. Is not immobile.   Last Pap 11/16 negative, last Pap/HPV negative in 2013. Due for Pap/HPV in 2018. No history of abnormal PAPs. Did give birth in 4503, complicated by HELLP syndrome. Does not do routine breast exams. Has no family history of breast cancer.   No family history of colon cancer.   ----------------------------------------------------------------- Pap- 10/21/15 negative. Negative HPV and pap in 2013   Immunization History  Administered Date(s) Administered  . Influenza Split 09/13/2013,  07/31/2014  . Influenza-Unspecified 08/29/2015, 10/05/2016  . Tdap 07/31/2014     Review of Systems  Constitutional: Negative.   HENT: Negative.   Eyes: Negative.   Respiratory: Negative.   Cardiovascular: Negative.   Gastrointestinal: Negative.   Endocrine: Negative.   Genitourinary: Negative.   Musculoskeletal: Positive for back pain.  Skin: Negative.   Allergic/Immunologic: Negative.   Neurological: Positive for dizziness.  Hematological: Negative.   Psychiatric/Behavioral: Negative.     Social History      She  reports that she has never smoked. She has never used smokeless tobacco. She reports that she does not drink alcohol or use drugs.       Social History   Social History  . Marital status: Married    Spouse name: N/A  . Number of children: N/A  . Years of education: N/A   Social History Main Topics  . Smoking status: Never Smoker  . Smokeless tobacco: Never Used  . Alcohol use No     Comment: occasional before pregnancy  . Drug use: No  . Sexual activity: Yes    Birth control/ protection: None   Other Topics Concern  . None   Social History Narrative  . None    Past Medical History:  Diagnosis Date  . Anemia   . Headache(784.0)   . Infection    uti  . Seizures ([place])    "small ones as child"  . 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 Absence of menstruation 08/19/2015  . Blood pressure elevated 08/19/2015  . Glaucoma 08/19/2015  . Personal history of disease of skin and subcutaneous tissue 08/19/2015  . Edema of foot 08/19/2015  . S/P cesarean section 09/06/2014  . Gestational hypertension 08/30/2014  . Dizziness 05/04/2014  . Obesity 04/03/2014  . Antepartum multigravida of advanced maternal age 93/09/2014  . Sturge-Weber syndrome ([place]) 02/07/2014  . Rh negative state in antepartum period 01/03/2014    Past Surgical History:  Procedure Laterality Date  . CESAREAN  SECTION N/A 09/02/2014   Procedure: CESAREAN SECTION;  Surgeon: Mora Bellman, MD;  Location: [facility];  Service: Obstetrics;  Laterality: N/A;  . EYE SURGERY  1977  . EYE SURGERY Right 2010   glaucoma  . TOE SURGERY  1977    Family History        Family Status  Relation Status  . Mother Alive  . Maternal Grandmother Deceased  . Father Alive  . Sister Deceased  . Son Alive  . Maternal Grandfather Deceased  . Paternal Grandmother Alive  . Paternal Grandfather Deceased  . Sister Alive  . Sister Alive  . Maternal Aunt   . Neg Hx         Her family history includes Alzheimer's disease in her maternal grandfather and maternal grandmother; Diabetes in her maternal aunt and maternal grandmother; Healthy in her sister and son; Heart disease in her mother; Hypertension in her father; Parkinsonism in her maternal grandfather.     Allergies  Allergen Reactions  . Other Other (See Comments)    Pt states that she is allergic to walnuts- Reaction:  Causes tongue to hurt.      Current Outpatient Prescriptions:  .  IRON PO, Take by mouth., Disp: , Rfl:  .  SUMAtriptan (IMITREX) 50 MG tablet, TAKE 1 TABLET BY MOUTH AS DIRECTED, MAY REPEAT IN 2 HOURS IF HEADACHE PERSISTS OR RECURS, Disp: 10 tablet, Rfl: 0 .  norgestimate-ethinyl estradiol (ORTHO-CYCLEN,SPRINTEC,PREVIFEM) 0.25-35 MG-MCG tablet, Take 1 tablet by mouth daily., Disp: 1 Package, Rfl: 11   Patient Care Team: Margo Common, PA as PCP - General (Physician Assistant)      Objective:   Vitals: BP 110/74 (BP Location: Left Arm, Patient Position: Sitting, Cuff Size: Large)   Pulse 68   Temp 97.8 F (36.6 C) (Oral)   Resp 16   Ht 5' 1" (1.549 m)   Wt 224 lb (101.6 kg)   BMI 42.32 kg/m    Physical Exam  Constitutional: She is oriented to person, place, and time. She appears well-developed and well-nourished. No distress.  HENT:  Right Ear: Tympanic membrane and external ear normal.  Left Ear: Tympanic membrane and  external ear normal.  Mouth/Throat: Uvula is midline. No oropharyngeal exudate, posterior oropharyngeal edema or posterior oropharyngeal erythema.  Neck: Neck supple. No thyromegaly present.  Cardiovascular: Normal rate and regular rhythm.   Pulmonary/Chest: Effort normal and breath sounds normal. Right breast exhibits no inverted nipple, no mass, no nipple discharge, no skin change and no tenderness. Left breast exhibits no inverted nipple, no mass, no nipple discharge, no skin change and no tenderness. Breasts are symmetrical.  Abdominal: Soft. Bowel sounds are normal. She exhibits no distension and no mass. There is no tenderness. There is no rebound and no guarding.  Lymphadenopathy:    She has no cervical adenopathy.  Neurological: She is alert and oriented to person, place, and time.  Skin:  Skin is warm and dry. She is not diaphoretic.  Right sided facial port wine stain.  Psychiatric: She has a normal mood and affect. Her behavior is normal.     Depression Screen PHQ 2/9 Scores 07/17/2014 06/12/2014 05/15/2014  PHQ - 2 Score 0 0 0      Assessment & Plan:     Routine Health Maintenance and Physical Exam  Exercise Activities and Dietary recommendations Goals    None      Immunization History  Administered Date(s) Administered  . Influenza Split 09/13/2013, 07/31/2014  . Influenza-Unspecified 08/29/2015, 10/05/2016  . Tdap 07/31/2014    Health Maintenance  Topic Date Due  . INFLUENZA VACCINE  06/30/2016  . PAP SMEAR  10/20/2018  . TETANUS/TDAP  07/31/2024  . HIV Screening  Completed     Discussed health benefits of physical activity, and encouraged her to engage in regular exercise appropriate for her age and condition.    1. Annual physical exam   - MM DIGITAL SCREENING BILATERAL; Future  2. Class 3 obesity with body mass index (BMI) of 40.0 to 44.9 in adult, unspecified obesity type, unspecified whether serious comorbidity present [facility])  Patient has lost  twenty pounds. Check labs as below.  - Comprehensive metabolic panel - TSH - Lipid panel - CBC with Differential/Platelet  3. Encounter for other contraceptive management  Discussed options for birth control. Seasonique contraindicated due to age and migraines. Discussed possibility of taking current birth control continuously with one placebo week every 3 months, but patient thinks that she will get confused with this. Have talked about possibility of IUD, since these are without estrogen, are long term, patient does not desire more children, and there is the possibility of no periods to hopefully help with migraines. Counseled on risks of estrogen use in somebody her age and BMI. Patient does accept these risks and will continue Sprintec until she meets with gynecology. Patient agrees to gynecology referral.   - norgestimate-ethinyl estradiol (ORTHO-CYCLEN,SPRINTEC,PREVIFEM) 0.25-35 MG-MCG tablet; Take 1 tablet by mouth daily.  Dispense: 1 Package; Refill: 11 - Ambulatory referral to Obstetrics / Gynecology  4. Menstrual migraine without status migrainosus, not intractable  No aura. Refilled imitrex for as needed use.  - SUMAtriptan (IMITREX) 50 MG tablet; TAKE 1 TABLET BY MOUTH AS DIRECTED, MAY REPEAT IN 2 HOURS IF HEADACHE PERSISTS OR RECURS  Dispense: 10 tablet; Refill: 0  5. Sturge-Weber syndrome ([place])  Return in about 1 year (around 11/26/2017) for cpe.  The entirety of the information documented in the History of Present Illness, Review of Systems and Physical Exam were personally obtained by me. Portions of this information were initially documented by [place] and reviewed by me for thoroughness and accuracy.     Patient Instructions  Health Maintenance, Female Introduction Adopting a healthy lifestyle and getting preventive care can go a long way to promote health and wellness. Talk with your health care provider about what schedule of regular examinations is right for  you. This is a good chance for you to check in with your provider about disease prevention and staying healthy. In between checkups, there are plenty of things you can do on your own. Experts have done a lot of research about which lifestyle changes and preventive measures are most likely to keep you healthy. Ask your health care provider for more information. Weight and diet Eat a healthy diet  Be sure to include plenty of vegetables, fruits, low-fat dairy products, and lean protein.  Do not  eat a lot of foods high in solid fats, added sugars, or salt.  Get regular exercise. This is one of the most important things you can do for your health.  Most adults should exercise for at least 150 minutes each week. The exercise should increase your heart rate and make you sweat (moderate-intensity exercise).  Most adults should also do strengthening exercises at least twice a week. This is in addition to the moderate-intensity exercise. Maintain a healthy weight  Body mass index (BMI) is a measurement that can be used to identify possible weight problems. It estimates body fat based on height and weight. Your health care provider can help determine your BMI and help you achieve or maintain a healthy weight.  For females 74 years of age and older:  A BMI below 18.5 is considered underweight.  A BMI of 18.5 to 24.9 is normal.  A BMI of 25 to 29.9 is considered overweight.  A BMI of 30 and above is considered obese. Watch levels of cholesterol and blood lipids  You should start having your blood tested for lipids and cholesterol at 40 years of age, then have this test every 5 years.  You may need to have your cholesterol levels checked more often if:  Your lipid or cholesterol levels are high.  You are older than 40 years of age.  You are at high risk for heart disease. Cancer screening Lung Cancer  Lung cancer screening is recommended for adults 79-9 years old who are at high risk for  lung cancer because of a history of smoking.  A yearly low-dose CT scan of the lungs is recommended for people who:  Currently smoke.  Have quit within the past 15 years.  Have at least a 30-pack-year history of smoking. A pack year is smoking an average of one pack of cigarettes a day for 1 year.  Yearly screening should continue until it has been 15 years since you quit.  Yearly screening should stop if you develop a health problem that would prevent you from having lung cancer treatment. Breast Cancer  Practice breast self-awareness. This means understanding how your breasts normally appear and feel.  It also means doing regular breast self-exams. Let your health care provider know about any changes, no matter how small.  If you are in your 20s or 30s, you should have a clinical breast exam (CBE) by a health care provider every 1-3 years as part of a regular health exam.  If you are 67 or older, have a CBE every year. Also consider having a breast X-ray (mammogram) every year.  If you have a family history of breast cancer, talk to your health care provider about genetic screening.  If you are at high risk for breast cancer, talk to your health care provider about having an MRI and a mammogram every year.  Breast cancer gene (BRCA) assessment is recommended for women who have family members with BRCA-related cancers. BRCA-related cancers include:  Breast.  Ovarian.  Tubal.  Peritoneal cancers.  Results of the assessment will determine the need for genetic counseling and BRCA1 and BRCA2 testing. Cervical Cancer  Your health care provider may recommend that you be screened regularly for cancer of the pelvic organs (ovaries, uterus, and vagina). This screening involves a pelvic examination, including checking for microscopic changes to the surface of your cervix (Pap test). You may be encouraged to have this screening done every 3 years, beginning at age 79.  For women ages  30-65, health care providers may recommend pelvic exams and Pap testing every 3 years, or they may recommend the Pap and pelvic exam, combined with testing for human papilloma virus (HPV), every 5 years. Some types of HPV increase your risk of cervical cancer. Testing for HPV may also be done on women of any age with unclear Pap test results.  Other health care providers may not recommend any screening for nonpregnant women who are considered low risk for pelvic cancer and who do not have symptoms. Ask your health care provider if a screening pelvic exam is right for you.  If you have had past treatment for cervical cancer or a condition that could lead to cancer, you need Pap tests and screening for cancer for at least 20 years after your treatment. If Pap tests have been discontinued, your risk factors (such as having a new sexual partner) need to be reassessed to determine if screening should resume. Some women have medical problems that increase the chance of getting cervical cancer. In these cases, your health care provider may recommend more frequent screening and Pap tests. Colorectal Cancer  This type of cancer can be detected and often prevented.  Routine colorectal cancer screening usually begins at 40 years of age and continues through 40 years of age.  Your health care provider may recommend screening at an earlier age if you have risk factors for colon cancer.  Your health care provider may also recommend using home test kits to check for hidden blood in the stool.  A small camera at the end of a tube can be used to examine your colon directly (sigmoidoscopy or colonoscopy). This is done to check for the earliest forms of colorectal cancer.  Routine screening usually begins at age 65.  Direct examination of the colon should be repeated every 5-10 years through 40 years of age. However, you may need to be screened more often if early forms of precancerous polyps or small growths are  found. Skin Cancer  Check your skin from head to toe regularly.  Tell your health care provider about any new moles or changes in moles, especially if there is a change in a mole's shape or color.  Also tell your health care provider if you have a mole that is larger than the size of a pencil eraser.  Always use sunscreen. Apply sunscreen liberally and repeatedly throughout the day.  Protect yourself by wearing long sleeves, pants, a wide-brimmed hat, and sunglasses whenever you are outside. Heart disease, diabetes, and high blood pressure  High blood pressure causes heart disease and increases the risk of stroke. High blood pressure is more likely to develop in:  People who have blood pressure in the high end of the normal range (130-139/85-89 mm Hg).  People who are overweight or obese.  People who are African American.  If you are 32-67 years of age, have your blood pressure checked every 3-5 years. If you are 84 years of age or older, have your blood pressure checked every year. You should have your blood pressure measured twice-once when you are at a hospital or clinic, and once when you are not at a hospital or clinic. Record the average of the two measurements. To check your blood pressure when you are not at a hospital or clinic, you can use:  An automated blood pressure machine at a pharmacy.  A home blood pressure monitor.  If you are between 59 years and 71 years old, ask your health  care provider if you should take aspirin to prevent strokes.  Have regular diabetes screenings. This involves taking a blood sample to check your fasting blood sugar level.  If you are at a normal weight and have a low risk for diabetes, have this test once every three years after 40 years of age.  If you are overweight and have a high risk for diabetes, consider being tested at a younger age or more often. Preventing infection Hepatitis B  If you have a higher risk for hepatitis B, you  should be screened for this virus. You are considered at high risk for hepatitis B if:  You were born in a country where hepatitis B is common. Ask your health care provider which countries are considered high risk.  Your parents were born in a high-risk country, and you have not been immunized against hepatitis B (hepatitis B vaccine).  You have HIV or AIDS.  You use needles to inject street drugs.  You live with someone who has hepatitis B.  You have had sex with someone who has hepatitis B.  You get hemodialysis treatment.  You take certain medicines for conditions, including cancer, organ transplantation, and autoimmune conditions. Hepatitis C  Blood testing is recommended for:  Everyone born from [birth year] through 1965.  Anyone with known risk factors for hepatitis C. Sexually transmitted infections (STIs)  You should be screened for sexually transmitted infections (STIs) including gonorrhea and chlamydia if:  You are sexually active and are younger than 40 years of age.  You are older than 40 years of age and your health care provider tells you that you are at risk for this type of infection.  Your sexual activity has changed since you were last screened and you are at an increased risk for chlamydia or gonorrhea. Ask your health care provider if you are at risk.  If you do not have HIV, but are at risk, it may be recommended that you take a prescription medicine daily to prevent HIV infection. This is called pre-exposure prophylaxis (PrEP). You are considered at risk if:  You are sexually active and do not regularly use condoms or know the HIV status of your partner(s).  You take drugs by injection.  You are sexually active with a partner who has HIV. Talk with your health care provider about whether you are at high risk of being infected with HIV. If you choose to begin PrEP, you should first be tested for HIV. You should then be tested every 3 months for as long as you  are taking PrEP. Pregnancy  If you are premenopausal and you may become pregnant, ask your health care provider about preconception counseling.  If you may become pregnant, take 400 to 800 micrograms (mcg) of folic acid every day.  If you want to prevent pregnancy, talk to your health care provider about birth control (contraception). Osteoporosis and menopause  Osteoporosis is a disease in which the bones lose minerals and strength with aging. This can result in serious bone fractures. Your risk for osteoporosis can be identified using a bone density scan.  If you are 42 years of age or older, or if you are at risk for osteoporosis and fractures, ask your health care provider if you should be screened.  Ask your health care provider whether you should take a calcium or vitamin D supplement to lower your risk for osteoporosis.  Menopause may have certain physical symptoms and risks.  Hormone replacement therapy may reduce some  of these symptoms and risks. Talk to your health care provider about whether hormone replacement therapy is right for you. Follow these instructions at home:  Schedule regular health, dental, and eye exams.  Stay current with your immunizations.  Do not use any tobacco products including cigarettes, chewing tobacco, or electronic cigarettes.  If you are pregnant, do not drink alcohol.  If you are breastfeeding, limit how much and how often you drink alcohol.  Limit alcohol intake to no more than 1 drink per day for nonpregnant women. One drink equals 12 ounces of beer, 5 ounces of wine, or 1 ounces of hard liquor.  Do not use street drugs.  Do not share needles.  Ask your health care provider for help if you need support or information about quitting drugs.  Tell your health care provider if you often feel depressed.  Tell your health care provider if you have ever been abused or do not feel safe at home. This information is not intended to replace  advice given to you by your health care provider. Make sure you discuss any questions you have with your health care provider. Document Released: 06/01/2011 Document Revised: 04/23/2016 Document Reviewed: 08/20/2015  2017 Elsevier    --------------------------------------------------------------------    Trinna Post, PA-C  [facility]

## 2016-11-26 NOTE — Patient Instructions (Signed)

## 2016-11-27 ENCOUNTER — Telehealth: Payer: Self-pay

## 2016-11-27 LAB — LIPID PANEL
Chol/HDL Ratio: 4.7 ratio units — ABNORMAL HIGH (ref 0.0–4.4)
Cholesterol, Total: 222 mg/dL — ABNORMAL HIGH (ref 100–199)
HDL: 47 mg/dL (ref >39)
LDL Calculated: 131 mg/dL — ABNORMAL HIGH (ref 0–99)
Triglycerides: 222 mg/dL — ABNORMAL HIGH (ref 0–149)
VLDL Cholesterol Cal: 44 mg/dL — ABNORMAL HIGH (ref 5–40)

## 2016-11-27 LAB — COMPREHENSIVE METABOLIC PANEL
ALT: 9 IU/L (ref 0–32)
AST: 17 IU/L (ref 0–40)
Albumin/Globulin Ratio: 1.2 (ref 1.2–2.2)
Albumin: 3.8 g/dL (ref 3.5–5.5)
Alkaline Phosphatase: 47 IU/L (ref 39–117)
BUN/Creatinine Ratio: 22 (ref 9–23)
BUN: 18 mg/dL (ref 6–24)
Bilirubin Total: 0.3 mg/dL (ref 0.0–1.2)
CO2: 25 mmol/L (ref 18–29)
Calcium: 9 mg/dL (ref 8.7–10.2)
Chloride: 102 mmol/L (ref 96–106)
Creatinine, Ser: 0.82 mg/dL (ref 0.57–1.00)
GFR calc Af Amer: 104 mL/min/{1.73_m2} (ref >59)
GFR calc non Af Amer: 90 mL/min/{1.73_m2} (ref >59)
Globulin, Total: 3.1 g/dL (ref 1.5–4.5)
Glucose: 93 mg/dL (ref 65–99)
Potassium: 4.3 mmol/L (ref 3.5–5.2)
Sodium: 139 mmol/L (ref 134–144)
Total Protein: 6.9 g/dL (ref 6.0–8.5)

## 2016-11-27 LAB — TSH: TSH: 3.26 u[IU]/mL (ref 0.450–4.500)

## 2016-11-27 LAB — CBC WITH DIFFERENTIAL/PLATELET
Basophils Absolute: 0 10*3/uL (ref 0.0–0.2)
Basos: 1 %
EOS (ABSOLUTE): 0.1 10*3/uL (ref 0.0–0.4)
Eos: 1 %
Hematocrit: 40.4 % (ref 34.0–46.6)
Hemoglobin: 13.7 g/dL (ref 11.1–15.9)
Immature Grans (Abs): 0 10*3/uL (ref 0.0–0.1)
Immature Granulocytes: 0 %
Lymphocytes Absolute: 1.7 10*3/uL (ref 0.7–3.1)
Lymphs: 33 %
MCH: 30.4 pg (ref 26.6–33.0)
MCHC: 33.9 g/dL (ref 31.5–35.7)
MCV: 90 fL (ref 79–97)
Monocytes Absolute: 0.5 10*3/uL (ref 0.1–0.9)
Monocytes: 9 %
Neutrophils Absolute: 2.9 10*3/uL (ref 1.4–7.0)
Neutrophils: 56 %
Platelets: 216 10*3/uL (ref 150–379)
RBC: 4.5 x10E6/uL (ref 3.77–5.28)
RDW: 14.2 % (ref 12.3–15.4)
WBC: 5.1 10*3/uL (ref 3.4–10.8)

## 2016-11-27 NOTE — Telephone Encounter (Signed)
[email or web address]

## 2016-11-27 NOTE — Telephone Encounter (Signed)
-----   Message from Trey SailorsAdriana M Pollak, [place]PA-C sent at 11/27/2016 11:17 AM EST ----- CBC looks normal, TSH normal, metabolic panel normal. Cholesterol is elevated, triglycerides elevated, LDL elevated. Does not require treatment with statins, but does require diet and exercise intervention which patient has already begun. Keep up with diet and exercise and hopefully this will improve.

## 2016-12-01 NOTE — Telephone Encounter (Signed)
LM on VM, per DPR. sd

## 2016-12-10 ENCOUNTER — Encounter: Payer: BLUE CROSS/BLUE SHIELD | Admitting: Certified Nurse Midwife

## 2016-12-14 ENCOUNTER — Emergency Department
Admission: EM | Admit: 2016-12-14 | Discharge: 2016-12-15 | Disposition: A | Discharge status: Home / Self Care | Payer: BLUE CROSS/BLUE SHIELD | Attending: Emergency Medicine | Admitting: Emergency Medicine

## 2016-12-14 ENCOUNTER — Encounter: Payer: Self-pay | Admitting: Emergency Medicine

## 2016-12-14 DIAGNOSIS — G43809 Other migraine, not intractable, without status migrainosus: Secondary | ICD-10-CM | POA: Insufficient documentation

## 2016-12-14 DIAGNOSIS — R51 Headache: Secondary | ICD-10-CM | POA: Diagnosis present

## 2016-12-14 NOTE — ED Triage Notes (Signed)
Patient ambulatory to triage with steady gait, without difficulty or distress noted; pt reports right sided migraine since Sunday pm accomp by nausea; st pain unrelieved by sumatripan

## 2016-12-15 MED ORDER — KETOROLAC TROMETHAMINE 10 MG PO TABS: 10.0000 mg | ORAL_TABLET | Freq: Four times a day (QID) | ORAL | 0 refills | Status: DC | PRN

## 2016-12-15 MED ORDER — METOCLOPRAMIDE HCL 10 MG PO TABS: 10.0000 mg | ORAL_TABLET | Freq: Three times a day (TID) | ORAL | 0 refills | Status: DC | PRN

## 2016-12-15 MED ORDER — KETOROLAC TROMETHAMINE 30 MG/ML IJ SOLN
30.0000 mg | Freq: Once | INTRAMUSCULAR | Status: AC
  Administered 2016-12-15: 30 mg via INTRAVENOUS
  Filled 2016-12-15: qty 1

## 2016-12-15 MED ORDER — METOCLOPRAMIDE HCL 5 MG/ML IJ SOLN
10.0000 mg | Freq: Once | INTRAMUSCULAR | Status: AC
  Administered 2016-12-15: 10 mg via INTRAVENOUS
  Filled 2016-12-15: qty 2

## 2016-12-15 NOTE — ED Provider Notes (Signed)
[facility] Emergency Department Provider Note   First MD Initiated Contact with Patient 12/14/16 2359     (approximate)  I have reviewed the triage vital signs and the nursing notes.   HISTORY  Chief Complaint Migraine    HPI Misty Little is a 41 y.o. female with history of Sturge Weber syndrome and migraine headaches presents to the emergency department with "migraine headache with onset Sunday accompanied by nausea. Patient states symptoms not improve with sumatriptan a.m. and at home. Patient states headache is consistent with previous migraines just lasting "longer than usual". Patient denies any weakness numbness gait instability or visual changes.   Past Medical History:  Diagnosis Date  . Anemia   . Headache(784.0)   . Infection    uti  . Seizures (HCC)    "small ones as child"  . Sturge-Weber syndrome with glaucoma [facility](HCC)     Patient Active Problem List   Diagnosis Date Noted  . Absence of menstruation 08/19/2015  . Blood pressure elevated 08/19/2015  . Glaucoma 08/19/2015  . Personal history of disease of skin and subcutaneous tissue 08/19/2015  . Edema of foot 08/19/2015  . S/P cesarean section 09/06/2014  . Gestational hypertension 08/30/2014  . Dizziness 05/04/2014  . Obesity 04/03/2014  . Antepartum multigravida of advanced maternal age 11/09/2014  . 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 Rh negative state in antepartum period 01/03/2014    Past Surgical History:  Procedure Laterality Date  . CESAREAN SECTION N/A 09/02/2014   Procedure: CESAREAN SECTION;  Surgeon: Catalina AntiguaPeggy Constant, MD;  Location: [facility];  Service: Obstetrics;  Laterality: N/A;  . EYE SURGERY  1977  . EYE SURGERY Right 2010   glaucoma  . TOE SURGERY  1977    Prior to Admission medications   Medication Sig Start Date End Date Taking? Authorizing Provider  IRON PO Take by mouth.    Historical Provider, MD  ketorolac (TORADOL) 10 MG tablet Take 1 tablet (10  mg total) by mouth every 6 (six) hours as needed. 12/15/16   Darci Currentandolph N Brown, MD  metoCLOPramide (REGLAN) 10 MG tablet Take 1 tablet (10 mg total) by mouth every 8 (eight) hours as needed for nausea. 12/15/16 12/20/16  Darci Currentandolph N Brown, MD  norgestimate-ethinyl estradiol (ORTHO-CYCLEN,SPRINTEC,PREVIFEM) 0.25-35 MG-MCG tablet Take 1 tablet by mouth daily. 11/26/16   Trey SailorsAdriana M Pollak, PA-C  SUMAtriptan (IMITREX) 50 MG tablet TAKE 1 TABLET BY MOUTH AS DIRECTED, MAY REPEAT IN 2 HOURS IF HEADACHE PERSISTS OR RECURS 11/26/16   Trey SailorsAdriana M Pollak, PA-C    Allergies Other  Family History  Problem Relation Age of Onset  . Heart disease Mother   . Diabetes Maternal Grandmother   . Alzheimer's disease Maternal Grandmother   . Hypertension Father   . Healthy Son   . Alzheimer's disease Maternal Grandfather   . Parkinsonism Maternal Grandfather   . Healthy Sister   . Diabetes Maternal Aunt   . Hearing loss Neg Hx     Social History Social History  Substance Use Topics  . Smoking status: Never Smoker  . Smokeless tobacco: Never Used  . Alcohol use No     Comment: occasional before pregnancy    Review of Systems Constitutional: No fever/chills Eyes: No visual changes. ENT: No sore throat. Cardiovascular: Denies chest pain. Respiratory: Denies shortness of breath. Gastrointestinal: No abdominal pain.  No nausea, no vomiting.  No diarrhea.  No constipation. Genitourinary: Negative for dysuria. Musculoskeletal: Negative for back pain. Skin: Negative for  rash. Neurological: Positive for headaches, negative for focal weakness or numbness.  10-point ROS otherwise negative.  ____________________________________________   PHYSICAL EXAM:  VITAL SIGNS: ED Triage Vitals  Enc Vitals Group     BP 12/14/16 2346 133/71     Pulse Rate 12/14/16 2346 77     Resp 12/14/16 2346 20     Temp 12/14/16 2346 97.5 F (36.4 C)     Temp Source 12/14/16 2346 Oral     SpO2 12/14/16 2346 98 %     Weight  12/14/16 2343 224 lb (101.6 kg)     Height 12/14/16 2343 5\' 1"  (1.549 m)     Head Circumference --      Peak Flow --      Pain Score 12/14/16 2344 5     Pain Loc --      Pain Edu? --      Excl. in GC? --     Constitutional: Alert and oriented. Well appearing and in no acute distress. Eyes: Conjunctivae are normal. PERRL. EOMI. Head: Atraumatic. Mouth/Throat: Mucous membranes are moist.  Oropharynx non-erythematous. Neck: No stridor.  No meningeal signs. Cardiovascular: Normal rate, regular rhythm. Good peripheral circulation. Grossly normal heart sounds. Respiratory: Normal respiratory effort.  No retractions. Lungs CTAB. Gastrointestinal: Soft and nontender. No distention.  Musculoskeletal: No lower extremity tenderness nor edema. No gross deformities of extremities. Neurologic:  Normal speech and language. No gross focal neurologic deficits are appreciated.  Skin:  Skin is warm, dry and intact. No rash noted. Right face port wine stain.    Procedures     INITIAL IMPRESSION / ASSESSMENT AND PLAN / ED COURSE  Pertinent labs & imaging results that were available during my care of the patient were reviewed by me and considered in my medical decision making (see chart for details).  Patient received IV Toradol and Reglan with complete resolution of headache.   Clinical Course     ____________________________________________  FINAL CLINICAL IMPRESSION(S) / ED DIAGNOSES  Final diagnoses:  Other migraine without status migrainosus, not intractable     MEDICATIONS GIVEN DURING THIS VISIT:  Medications  ketorolac (TORADOL) 30 MG/ML injection 30 mg (30 mg Intravenous Given 12/15/16 0033)  metoCLOPramide (REGLAN) injection 10 mg (10 mg Intravenous Given 12/15/16 0034)     NEW OUTPATIENT MEDICATIONS STARTED DURING THIS VISIT:  Discharge Medication List as of 12/15/2016  1:34 AM    START taking these medications   Details  ketorolac (TORADOL) 10 MG tablet Take 1 tablet  (10 mg total) by mouth every 6 (six) hours as needed., Starting Tue 12/15/2016, Print    metoCLOPramide (REGLAN) 10 MG tablet Take 1 tablet (10 mg total) by mouth every 8 (eight) hours as needed for nausea., Starting Tue 12/15/2016, Until Sun 12/20/2016, Print        Discharge Medication List as of 12/15/2016  1:34 AM      Discharge Medication List as of 12/15/2016  1:34 AM       Note:  This document was prepared using Dragon voice recognition software and may include unintentional dictation errors.    Darci Current, MD 12/16/16 [phone]

## 2017-03-24 ENCOUNTER — Ambulatory Visit (INDEPENDENT_AMBULATORY_CARE_PROVIDER_SITE_OTHER): Payer: BLUE CROSS/BLUE SHIELD | Admitting: Physician Assistant

## 2017-03-24 ENCOUNTER — Encounter: Payer: Self-pay | Admitting: Physician Assistant

## 2017-03-24 VITALS — BP 104/68 | HR 88 | Temp 98.6°F | Resp 16 | Wt 223.0 lb

## 2017-03-24 DIAGNOSIS — B9789 Other viral agents as the cause of diseases classified elsewhere: Secondary | ICD-10-CM | POA: Diagnosis not present

## 2017-03-24 DIAGNOSIS — J069 Acute upper respiratory infection, unspecified: Secondary | ICD-10-CM | POA: Diagnosis not present

## 2017-03-24 MED ORDER — BENZONATATE 100 MG PO CAPS: 100.0000 mg | ORAL_CAPSULE | Freq: Three times a day (TID) | ORAL | 0 refills | Status: AC | PRN

## 2017-03-24 NOTE — Patient Instructions (Signed)
Upper Respiratory Infection, Adult Most upper respiratory infections (URIs) are caused by a virus. A URI affects the nose, throat, and upper air passages. The most common type of URI is often called "the common cold." Follow these instructions at home:  Take medicines only as told by your doctor.  Gargle warm saltwater or take cough drops to comfort your throat as told by your doctor.  Use a warm mist humidifier or inhale steam from a shower to increase air moisture. This may make it easier to breathe.  Drink enough fluid to keep your pee (urine) clear or pale yellow.  Eat soups and other clear broths.  Have a healthy diet.  Rest as needed.  Go back to work when your fever is gone or your doctor says it is okay.  You may need to stay home longer to avoid giving your URI to others.  You can also wear a face mask and wash your hands often to prevent spread of the virus.  Use your inhaler more if you have asthma.  Do not use any tobacco products, including cigarettes, chewing tobacco, or electronic cigarettes. If you need help quitting, ask your doctor. Contact a doctor if:  You are getting worse, not better.  Your symptoms are not helped by medicine.  You have chills.  You are getting more short of breath.  You have brown or red mucus.  You have yellow or brown discharge from your nose.  You have pain in your face, especially when you bend forward.  You have a fever.  You have puffy (swollen) neck glands.  You have pain while swallowing.  You have white areas in the back of your throat. Get help right away if:  You have very bad or constant:  Headache.  Ear pain.  Pain in your forehead, behind your eyes, and over your cheekbones (sinus pain).  Chest pain.  You have long-lasting (chronic) lung disease and any of the following:  Wheezing.  Long-lasting cough.  Coughing up blood.  A change in your usual mucus.  You have a stiff neck.  You have  changes in your:  Vision.  Hearing.  Thinking.  Mood. This information is not intended to replace advice given to you by your health care provider. Make sure you discuss any questions you have with your health care provider. Document Released: 05/04/2008 Document Revised: 07/19/2016 Document Reviewed: 02/21/2014 Elsevier Interactive Patient Education  2017 [employer]vier Inc.  

## 2017-03-24 NOTE — Progress Notes (Signed)
[facility]  Chief Complaint  Patient presents with  . URI    Started about six days ago.    Subjective:    Patient ID: Misty Little, female    DOB: December 19, 1975, 41 y.o.   MRN: [id number]  Upper Respiratory Infection: Misty Little is a 41 y.o. female with a past medical history significant for OCP usage complaining of symptoms of a URI. Symptoms include congestion, cough and plugged sensation in both ears. Onset of symptoms was 6 days ago, unchanged since that time. She also c/o congestion, cough described as productive, nasal congestion and post nasal drip for the past 6 days .  She is drinking plenty of fluids. Evaluation to date: none. Treatment to date: antihistamines, cough suppressants and decongestants. The treatment has provided minimal relief. Overall, the patient is feeling better today and debated coming in. No signs of ankle edema.   Review of Systems  Constitutional: Positive for fatigue. Negative for activity change, appetite change, chills, diaphoresis, fever and unexpected weight change.  HENT: Positive for congestion, postnasal drip, rhinorrhea and sneezing. Negative for ear discharge, ear pain (Pt says her ears feel "Plugged up"), nosebleeds, sinus pain, sinus pressure, sore throat, tinnitus and trouble swallowing.   Eyes: Positive for discharge, redness and itching. Negative for photophobia, pain and visual disturbance.  Respiratory: Positive for cough, chest tightness and wheezing. Negative for apnea, choking, shortness of breath and stridor.   Gastrointestinal: Positive for nausea. Negative for abdominal distention, abdominal pain, anal bleeding, blood in stool, constipation, diarrhea, rectal pain and vomiting.  Neurological: Positive for light-headedness. Negative for dizziness and headaches.       Objective:   BP 104/68 (BP Location: Left Arm, Patient Position: Sitting, Cuff Size: Large)   Pulse 88   Temp 98.6 F (37 C) (Oral)    Resp 16   Wt 223 lb (101.2 kg)   LMP 02/10/2017   SpO2 97%   BMI 42.14 kg/m   Patient Active Problem List   Diagnosis Date Noted  . Absence of menstruation 08/19/2015  . Blood pressure elevated 08/19/2015  . Glaucoma 08/19/2015  . Personal history of disease of skin and subcutaneous tissue 08/19/2015  . Edema of foot 08/19/2015  . S/P cesarean section 09/06/2014  . Gestational hypertension 08/30/2014  . Dizziness 05/04/2014  . Obesity 04/03/2014  . Antepartum multigravida of advanced maternal age 89/09/2014  . 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 Rh negative state in antepartum period 01/03/2014    Outpatient Encounter Prescriptions as of 03/24/2017  Medication Sig  . IRON PO Take by mouth.  Marland Kitchen ketorolac (TORADOL) 10 MG tablet Take 1 tablet (10 mg total) by mouth every 6 (six) hours as needed.  . norgestimate-ethinyl estradiol (ORTHO-CYCLEN,SPRINTEC,PREVIFEM) 0.25-35 MG-MCG tablet Take 1 tablet by mouth daily.  . SUMAtriptan (IMITREX) 50 MG tablet TAKE 1 TABLET BY MOUTH AS DIRECTED, MAY REPEAT IN 2 HOURS IF HEADACHE PERSISTS OR RECURS  . benzonatate (TESSALON PERLES) 100 MG capsule Take 1 capsule (100 mg total) by mouth 3 (three) times daily as needed for cough.  . metoCLOPramide (REGLAN) 10 MG tablet Take 1 tablet (10 mg total) by mouth every 8 (eight) hours as needed for nausea.   No facility-administered encounter medications on file as of 03/24/2017.     Allergies  Allergen Reactions  . Other Other (See Comments)    Pt states that she is allergic to walnuts- Reaction:  Causes tongue to hurt.  Physical Exam  Constitutional: She appears well-developed and well-nourished. No distress.  HENT:  Right Ear: External ear normal.  Left Ear: External ear normal.  Mouth/Throat: Oropharynx is clear and moist. No oropharyngeal exudate.  Eyes: Right eye exhibits discharge. Left eye exhibits discharge.  Neck: Neck supple.  Cardiovascular: Normal rate and regular  rhythm.   Pulmonary/Chest: Effort normal and breath sounds normal. No respiratory distress. She has no wheezes. She has no rales.  No SOB.   Musculoskeletal: She exhibits no edema or tenderness.  Lymphadenopathy:    She has no cervical adenopathy.  Skin: Skin is warm and dry.  Psychiatric: She has a normal mood and affect. Her behavior is normal.       Assessment & Plan:  1. Viral URI with cough  Seems to be improving, no indication for antibiotics today. Treat symptomatically. May call back in 5 days if develops into sinus infection.  - benzonatate (TESSALON PERLES) 100 MG capsule; Take 1 capsule (100 mg total) by mouth 3 (three) times daily as needed for cough.  Dispense: 21 capsule; Refill: 0  Return if symptoms worsen or fail to improve.     Patient Instructions  Upper Respiratory Infection, Adult Most upper respiratory infections (URIs) are caused by a virus. A URI affects the nose, throat, and upper air passages. The most common type of URI is often called "the common cold." Follow these instructions at home:  Take medicines only as told by your doctor.  Gargle warm saltwater or take cough drops to comfort your throat as told by your doctor.  Use a warm mist humidifier or inhale steam from a shower to increase air moisture. This may make it easier to breathe.  Drink enough fluid to keep your pee (urine) clear or pale yellow.  Eat soups and other clear broths.  Have a healthy diet.  Rest as needed.  Go back to work when your fever is gone or your doctor says it is okay.  You may need to stay home longer to avoid giving your URI to others.  You can also wear a face mask and wash your hands often to prevent spread of the virus.  Use your inhaler more if you have asthma.  Do not use any tobacco products, including cigarettes, chewing tobacco, or electronic cigarettes. If you need help quitting, ask your doctor. Contact a doctor if:  You are getting worse, not  better.  Your symptoms are not helped by medicine.  You have chills.  You are getting more short of breath.  You have brown or red mucus.  You have yellow or brown discharge from your nose.  You have pain in your face, especially when you bend forward.  You have a fever.  You have puffy (swollen) neck glands.  You have pain while swallowing.  You have white areas in the back of your throat. Get help right away if:  You have very bad or constant:  Headache.  Ear pain.  Pain in your forehead, behind your eyes, and over your cheekbones (sinus pain).  Chest pain.  You have long-lasting (chronic) lung disease and any of the following:  Wheezing.  Long-lasting cough.  Coughing up blood.  A change in your usual mucus.  You have a stiff neck.  You have changes in your:  Vision.  Hearing.  Thinking.  Mood. This information is not intended to replace advice given to you by your health care provider. Make sure you discuss any questions you have  with your health care provider. Document Released: 05/04/2008 Document Revised: 07/19/2016 Document Reviewed: 02/21/2014 Elsevier Interactive Patient Education  2017 [employer].     The entirety of the information documented in the History of Present Illness, Review of Systems and Physical Exam were personally obtained by me. Portions of this information were initially documented by Kavin Leech, CMA and reviewed by me for thoroughness and accuracy.

## 2017-03-25 ENCOUNTER — Telehealth: Payer: Self-pay

## 2017-03-25 NOTE — Telephone Encounter (Signed)
-----   Message from Trey Sailors, [place] sent at 03/24/2017  4:30 PM EDT ----- Patient cancelled her OBGYN appointment but remains on oral OCPs. She was prescribed this by her post partum OBGYN in 2015 but hasn't been since. With her age and history of migraines and the risk of stroke, I really want her to see OBGYN to discuss other options. Otherwise, I am not comfortable filling the refill as my last prescription was conditional upon her seeing gynecology.

## 2017-03-25 NOTE — Telephone Encounter (Signed)
LMTCB  03/25/2017  Thanks,   -Vernona Rieger

## 2017-04-02 NOTE — Telephone Encounter (Signed)
[id number] 

## 2017-04-09 NOTE — Telephone Encounter (Signed)
[id number] 

## 2017-04-13 NOTE — Telephone Encounter (Signed)
Patient advised as below. Patient verbalizes understanding and is in agreement with treatment plan. Patient does not want to schedule GYN appt for now.

## 2017-10-12 ENCOUNTER — Encounter: Payer: Self-pay | Admitting: Obstetrics and Gynecology

## 2017-10-12 ENCOUNTER — Ambulatory Visit (INDEPENDENT_AMBULATORY_CARE_PROVIDER_SITE_OTHER): Payer: BLUE CROSS/BLUE SHIELD | Admitting: Obstetrics and Gynecology

## 2017-10-12 VITALS — BP 130/90 | HR 60 | Ht 62.0 in | Wt 237.0 lb

## 2017-10-12 DIAGNOSIS — Z3041 Encounter for surveillance of contraceptive pills: Secondary | ICD-10-CM | POA: Diagnosis not present

## 2017-10-12 DIAGNOSIS — Z124 Encounter for screening for malignant neoplasm of cervix: Secondary | ICD-10-CM | POA: Diagnosis not present

## 2017-10-12 DIAGNOSIS — Z1151 Encounter for screening for human papillomavirus (HPV): Secondary | ICD-10-CM

## 2017-10-12 DIAGNOSIS — N921 Excessive and frequent menstruation with irregular cycle: Secondary | ICD-10-CM

## 2017-10-12 DIAGNOSIS — Z1231 Encounter for screening mammogram for malignant neoplasm of breast: Secondary | ICD-10-CM | POA: Diagnosis not present

## 2017-10-12 DIAGNOSIS — Z1239 Encounter for other screening for malignant neoplasm of breast: Secondary | ICD-10-CM

## 2017-10-12 DIAGNOSIS — Z01419 Encounter for gynecological examination (general) (routine) without abnormal findings: Secondary | ICD-10-CM | POA: Diagnosis not present

## 2017-10-12 LAB — POCT URINE PREGNANCY: Preg Test, Ur: NEGATIVE

## 2017-10-12 MED ORDER — NORGESTIMATE-ETH ESTRADIOL 0.25-35 MG-MCG PO TABS: 1.0000 | ORAL_TABLET | Freq: Every day | ORAL | 0 refills | Status: DC

## 2017-10-12 NOTE — Patient Instructions (Signed)
I value your feedback and entrusting us with your care. If you get a [facility] patient survey, I would appreciate you taking the time to let us know about your experience today. Thank you! 

## 2017-10-12 NOTE — Progress Notes (Signed)
PCP:  Tamsen Roershrismon, Dennis E, PA   Chief Complaint  Patient presents with  . Menstrual Problem     HPI:      [name] is a 41 y.o. G2P1011 who LMP was Patient's last menstrual period was 08/30/2017., presents today for her NP annual examination.  Her menses are irregular with continuous dosing of OCPs for menstrual migraines with and without aura. She can't go the full 3 pill packs without having BTB/spotting. Menses last about 5 days during placebo wk, light flow, no dysmen. She has been bleeding for the past 2 months this time, however. Bleeding is very light but she has had increased cramping-- still mild, relieved with NSAIDs.  Neg TSH 12/17. Menstrual migraines definitely improved with cont dosing. No migraines other than with menses. She takes sumatriptan, or reglan with toradol for sx.   Sex activity: single partner, contraception - OCP (estrogen/progesterone). Husband with hx of genital warts. Last Pap: October 21, 2015  Results were: no abnormalities ; HPV DNA not done. No hx of abn per pt report. Hx of STDs: none  Last mammogram: never. Ordered with PCP.  There is no FH of breast cancer. There is no FH of ovarian cancer. The patient does not do self-breast exams.  Tobacco use: The patient denies current or previous tobacco use. Alcohol use: none No drug use.  Exercise: not active  She does not get adequate calcium and Vitamin D in her diet.   Past Medical History:  Diagnosis Date  . Anemia   . Headache(784.0)   . Infection    uti  . Menstrual migraine   . Seizures (HCC)    "small ones as child"  . Sturge-Weber syndrome with glaucoma [facility](HCC)     Past Surgical History:  Procedure Laterality Date  . EYE SURGERY  1977  . EYE SURGERY Right 2010   glaucoma  . TOE SURGERY  1977    Family History  Problem Relation Age of Onset  . Heart disease Mother   . Diabetes Maternal Grandmother   . Alzheimer's disease Maternal Grandmother   . Hypertension Father   .  Healthy Son   . Alzheimer's disease Maternal Grandfather   . Parkinsonism Maternal Grandfather   . Healthy Sister   . Diabetes Maternal Aunt   . Hearing loss Neg Hx     Social History   Socioeconomic History  . Marital status: Married    Spouse name: Not on file  . Number of children: Not on file  . Years of education: Not on file  . Highest education level: Not on file  Social Needs  . Financial resource strain: Not on file  . Food insecurity - worry: Not on file  . Food insecurity - inability: Not on file  . Transportation needs - medical: Not on file  . Transportation needs - non-medical: Not on file  Occupational History  . Not on file  Tobacco Use  . Smoking status: Never Smoker  . Smokeless tobacco: Never Used  Substance and Sexual Activity  . Alcohol use: No    Comment: occasional before pregnancy  . Drug use: No  . Sexual activity: Yes    Birth control/protection: None  Other Topics Concern  . Not on file  Social History Narrative  . Not on file    Current Meds  Medication Sig  . IRON PO Take by mouth.  . norgestimate-ethinyl estradiol (ORTHO-CYCLEN,SPRINTEC,PREVIFEM) 0.25-35 MG-MCG tablet Take 1 tablet daily by mouth.  . [  DISCONTINUED] norgestimate-ethinyl estradiol (ORTHO-CYCLEN,SPRINTEC,PREVIFEM) 0.25-35 MG-MCG tablet Take 1 tablet by mouth daily.     ROS:  Review of Systems  Constitutional: Negative for fatigue, fever and unexpected weight change.  Respiratory: Negative for cough, shortness of breath and wheezing.   Cardiovascular: Negative for chest pain, palpitations and leg swelling.  Gastrointestinal: Negative for blood in stool, constipation, diarrhea, nausea and vomiting.  Endocrine: Negative for cold intolerance, heat intolerance and polyuria.  Genitourinary: Positive for menstrual problem and vaginal bleeding. Negative for dyspareunia, dysuria, flank pain, frequency, genital sores, hematuria, pelvic pain, urgency, vaginal discharge and  vaginal pain.  Musculoskeletal: Negative for back pain, joint swelling and myalgias.  Skin: Negative for rash.  Neurological: Negative for dizziness, syncope, light-headedness, numbness and headaches.  Hematological: Negative for adenopathy.  Psychiatric/Behavioral: Negative for agitation, confusion, sleep disturbance and suicidal ideas. The patient is not nervous/anxious.      Objective: BP 130/90   Pulse 60   Ht 5\' 2"  (1.575 m)   Wt 237 lb (107.5 kg)   LMP 08/30/2017   BMI 43.35 kg/m    Physical Exam  Constitutional: She is oriented to person, place, and time. She appears well-developed and well-nourished.  Genitourinary: Vagina normal and uterus normal. There is no rash or tenderness on the right labia. There is no rash or tenderness on the left labia. No erythema or tenderness in the vagina. No vaginal discharge found. Right adnexum does not display mass and does not display tenderness. Left adnexum does not display mass and does not display tenderness. Cervix does not exhibit motion tenderness or polyp. Uterus is not enlarged or tender.  Neck: Normal range of motion. No thyromegaly present.  Cardiovascular: Normal rate, regular rhythm and normal heart sounds.  No murmur heard. Pulmonary/Chest: Effort normal and breath sounds normal. Right breast exhibits no mass, no nipple discharge, no skin change and no tenderness. Left breast exhibits no mass, no nipple discharge, no skin change and no tenderness.  Abdominal: Soft. There is no tenderness. There is no guarding.  Musculoskeletal: Normal range of motion.  Neurological: She is alert and oriented to person, place, and time. No cranial nerve deficit.  Psychiatric: She has a normal mood and affect. Her behavior is normal.  Vitals reviewed.   Results: Results for orders placed or performed in visit on 10/12/17 (from the past 24 hour(s))  POCT urine pregnancy     Status: Normal   Collection Time: 10/12/17  2:58 PM  Result Value  Ref Range   Preg Test, Ur Negative Negative    Assessment/Plan: Encounter for annual routine gynecological examination  Cervical cancer screening - Plan: IGP, Aptima HPV  Screening for HPV (human papillomavirus) - Plan: IGP, Aptima HPV  Screening for breast cancer - Pt to sched mammo (order in system through PCP)  Encounter for surveillance of contraceptive pills - OCP RF for 3 months. Pt to f/u via phone re: BTB sx. - Plan: norgestimate-ethinyl estradiol (ORTHO-CYCLEN,SPRINTEC,PREVIFEM) 0.25-35 MG-MCG tablet  Irregular intermenstrual bleeding - Doing cont dosing OCPs. May not be able to go Q3 months. Neg UPT. Do placebo pills now, then restart active pills. F/u if BTB persists for u/s.  - Plan: POCT urine pregnancy  If BTB stops, do active pills as long as possible for continuous dosing, then do placebo pills when spotting starts.   Meds ordered this encounter  Medications  . FLUZONE QUADRIVALENT 0.5 ML injection    Sig: ADM 0.5ML IM UTD    Refill:  0  . norgestimate-ethinyl  estradiol (ORTHO-CYCLEN,SPRINTEC,PREVIFEM) 0.25-35 MG-MCG tablet    Sig: Take 1 tablet daily by mouth.    Dispense:  3 Package    Refill:  0             GYN counsel mammography screening, use and side effects of OCP's, adequate intake of calcium and vitamin D, diet and exercise     F/U  Return in about 1 year (around 10/12/2018), or if symptoms worsen or fail to improve.  Valli Randol B. Torri Langston, PA-C 10/12/2017 3:03 PM

## 2017-10-14 LAB — IGP, APTIMA HPV
HPV Aptima: NEGATIVE
PAP SMEAR COMMENT: 0

## 2017-10-25 ENCOUNTER — Ambulatory Visit: Payer: BLUE CROSS/BLUE SHIELD | Admitting: Family Medicine

## 2017-10-25 ENCOUNTER — Encounter: Payer: Self-pay | Admitting: Family Medicine

## 2017-10-25 VITALS — BP 108/62 | HR 84 | Temp 98.5°F | Resp 16 | Wt 238.0 lb

## 2017-10-25 DIAGNOSIS — J069 Acute upper respiratory infection, unspecified: Secondary | ICD-10-CM

## 2017-10-25 DIAGNOSIS — H6982 Other specified disorders of Eustachian tube, left ear: Secondary | ICD-10-CM | POA: Diagnosis not present

## 2017-10-25 MED ORDER — FLUTICASONE PROPIONATE 50 MCG/ACT NA SUSP: 2.0000 | Freq: Every day | NASAL | 0 refills | Status: AC

## 2017-10-25 NOTE — Patient Instructions (Signed)
May try Delsym for cough. 

## 2017-10-25 NOTE — Progress Notes (Signed)
Subjective:     Patient ID: Misty Little, female   DOB: 12/16/1975, 41 y.o.   MRN: [id number] Chief Complaint  Patient presents with  . URI    Started at least two weeks ago.  Now also has left ear pain, and ringing.   Reports minimally productive cough. States left ear has decreased hearing: "like hearing out of a tunnel." Has used peroxide in her ear with little improvement. Reports her young son is sick as well. HPI   Review of Systems     Objective:   Physical Exam  Constitutional: She appears well-developed and well-nourished. No distress.  Ears: T.M's intact without inflammation Throat: no tonsillar enlargement or exudate Neck: no cervical adenopathy Lungs: clear     Assessment:    1. Viral upper respiratory tract infection  2. Eustachian tube dysfunction, left Flonase spray two squirts left nostril daily.    Plan:    Suggested use of Delsym. Consider oral prednisone if ear not improving in a week with nasal spray.

## 2017-10-29 ENCOUNTER — Telehealth: Payer: Self-pay | Admitting: Family Medicine

## 2017-10-29 ENCOUNTER — Other Ambulatory Visit: Payer: Self-pay | Admitting: Family Medicine

## 2017-10-29 MED ORDER — PREDNISONE 20 MG PO TABS: ORAL_TABLET | ORAL | 0 refills | Status: DC

## 2017-10-29 NOTE — Telephone Encounter (Signed)
Let her know I have sent in prednisone twice daily for a week. She may want to give the Flonase spray a few more days to work first.

## 2017-10-29 NOTE — Telephone Encounter (Signed)
[id number] 

## 2017-10-29 NOTE — Telephone Encounter (Signed)
Pt stated she had OV with Bob on 10/25/17 and has been taking fluticasone (FLONASE) 50 MCG/ACT nasal spray as directed. Pt stated her ear hasn't improved at all and she was told to call back & Nadine CountsBob would send something [facility] to [facility] [address] Please advise. Thanks TNP

## 2017-11-01 NOTE — Telephone Encounter (Signed)
Left detailed message notifying patient. KW

## 2018-02-18 ENCOUNTER — Ambulatory Visit (INDEPENDENT_AMBULATORY_CARE_PROVIDER_SITE_OTHER): Payer: BLUE CROSS/BLUE SHIELD | Admitting: Physician Assistant

## 2018-02-18 ENCOUNTER — Encounter: Payer: Self-pay | Admitting: Physician Assistant

## 2018-02-18 VITALS — BP 118/72 | Temp 98.7°F | Resp 16 | Wt 240.0 lb

## 2018-02-18 DIAGNOSIS — J40 Bronchitis, not specified as acute or chronic: Secondary | ICD-10-CM

## 2018-02-18 MED ORDER — ALBUTEROL SULFATE HFA 108 (90 BASE) MCG/ACT IN AERS: 2.0000 | INHALATION_SPRAY | Freq: Four times a day (QID) | RESPIRATORY_TRACT | 2 refills | Status: DC | PRN

## 2018-02-18 MED ORDER — PREDNISONE 20 MG PO TABS: 20.0000 mg | ORAL_TABLET | Freq: Two times a day (BID) | ORAL | 0 refills | Status: AC

## 2018-02-18 NOTE — Progress Notes (Signed)
[facility]  Chief Complaint  Patient presents with  . URI    Started over a week ago    Subjective:    Patient ID: Misty Little, female    DOB: 06/26/1976, 42 y.o.   MRN: [id number]  Upper Respiratory Infection: Misty Little is a 42 y.o. female complaining of symptoms of a URI, possible sinusitis. Symptoms include congestion, cough and swollen glands. Onset of symptoms was 10 days ago, gradually worsening since that time. She also c/o cough described as productive, nasal congestion and sinus pressure for the past 10 days .  She is drinking plenty of fluids. Evaluation to date: none. Treatment to date: antihistamines and nasal steroids. The treatment has provided no relief. She had a coworker who recently died from pneumonia from the flu and she is concerned.   Review of Systems  Constitutional: Positive for fatigue. Negative for activity change, appetite change, chills, diaphoresis, fever and unexpected weight change.  HENT: Positive for rhinorrhea. Negative for congestion, ear discharge, ear pain, postnasal drip, sinus pressure, sinus pain, sore throat, tinnitus and trouble swallowing.   Eyes: Negative.   Respiratory: Positive for cough, chest tightness and shortness of breath. Negative for apnea, choking, wheezing and stridor.   Gastrointestinal: Negative.   Musculoskeletal: Positive for neck pain and neck stiffness.  Neurological: Positive for headaches. Negative for dizziness and light-headedness.  Hematological: Negative for adenopathy.       Objective:   BP 118/72 (BP Location: Right Arm, Patient Position: Sitting, Cuff Size: Normal)   Temp 98.7 F (37.1 C) (Oral)   Resp 16   Wt 240 lb (108.9 kg)   BMI 43.90 kg/m   Patient Active Problem List   Diagnosis Date Noted  . Absence of menstruation 08/19/2015  . Blood pressure elevated 08/19/2015  . Glaucoma 08/19/2015  . Personal history of disease of skin and subcutaneous tissue  08/19/2015  . Edema of foot 08/19/2015  . S/P cesarean section 09/06/2014  . Gestational hypertension 08/30/2014  . Dizziness 05/04/2014  . Obesity 04/03/2014  . Antepartum multigravida of advanced maternal age 80/09/2014  . 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 Rh negative state in antepartum period 01/03/2014    Outpatient Encounter Medications as of 02/18/2018  Medication Sig  . cholecalciferol (VITAMIN D) 1000 units tablet Take 1,000 Units by mouth daily.  . fluticasone (FLONASE) 50 MCG/ACT nasal spray Place 2 sprays into both nostrils daily.  . IRON PO Take by mouth.  . norgestimate-ethinyl estradiol (ORTHO-CYCLEN,SPRINTEC,PREVIFEM) 0.25-35 MG-MCG tablet Take 1 tablet daily by mouth.  . SUMAtriptan (IMITREX) 50 MG tablet TAKE 1 TABLET BY MOUTH AS DIRECTED, MAY REPEAT IN 2 HOURS IF HEADACHE PERSISTS OR RECURS  . albuterol (PROVENTIL HFA;VENTOLIN HFA) 108 (90 Base) MCG/ACT inhaler Inhale 2 puffs into the lungs every 6 (six) hours as needed for wheezing or shortness of breath.  . metoCLOPramide (REGLAN) 10 MG tablet Take 1 tablet (10 mg total) by mouth every 8 (eight) hours as needed for nausea.  . predniSONE (DELTASONE) 20 MG tablet Take 1 tablet (20 mg total) by mouth 2 (two) times daily with a meal for 5 days.  . [DISCONTINUED] predniSONE (DELTASONE) 20 MG tablet One pill twice daily x one week (Patient not taking: Reported on 02/18/2018)   No facility-administered encounter medications on file as of 02/18/2018.     Allergies  Allergen Reactions  . Other Other (See Comments)    Pt states that she is allergic to walnuts- Reaction:  Causes tongue to hurt.        Physical Exam  Constitutional: She is oriented to person, place, and time. She appears well-developed and well-nourished.  HENT:  Right Ear: External ear normal.  Left Ear: External ear normal.  Mouth/Throat: Oropharynx is clear and moist. No oropharyngeal exudate.  Eyes: Conjunctivae are normal.  Neck: Neck  supple.  Cardiovascular: Normal rate and regular rhythm.  Pulmonary/Chest: Effort normal. She has wheezes.  Slight expiratory wheezes.   Lymphadenopathy:    She has no cervical adenopathy.  Neurological: She is alert and oriented to person, place, and time.  Skin: Skin is warm and dry.  Psychiatric: She has a normal mood and affect. Her behavior is normal.       Assessment & Plan:  1. Bronchitis  Can also start allergy medication due to history of allergies.  - predniSONE (DELTASONE) 20 MG tablet; Take 1 tablet (20 mg total) by mouth 2 (two) times daily with a meal for 5 days.  Dispense: 10 tablet; Refill: 0 - albuterol (PROVENTIL HFA;VENTOLIN HFA) 108 (90 Base) MCG/ACT inhaler; Inhale 2 puffs into the lungs every 6 (six) hours as needed for wheezing or shortness of breath.  Dispense: 1 Inhaler; Refill: 2  The entirety of the information documented in the History of Present Illness, Review of Systems and Physical Exam were personally obtained by me. Portions of this information were initially documented by Kavin Leech, CMA and reviewed by me for thoroughness and accuracy.   Return if symptoms worsen or fail to improve.

## 2018-02-18 NOTE — Patient Instructions (Signed)

## 2018-03-11 ENCOUNTER — Encounter: Payer: Self-pay | Admitting: Physician Assistant

## 2018-03-11 ENCOUNTER — Ambulatory Visit: Payer: BLUE CROSS/BLUE SHIELD | Admitting: Physician Assistant

## 2018-03-11 VITALS — BP 124/80 | HR 76 | Temp 98.7°F | Resp 16 | Wt 242.0 lb

## 2018-03-11 DIAGNOSIS — F329 Major depressive disorder, single episode, unspecified: Secondary | ICD-10-CM

## 2018-03-11 DIAGNOSIS — F32A Depression, unspecified: Secondary | ICD-10-CM

## 2018-03-11 DIAGNOSIS — F419 Anxiety disorder, unspecified: Secondary | ICD-10-CM | POA: Diagnosis not present

## 2018-03-11 MED ORDER — ESCITALOPRAM OXALATE 10 MG PO TABS: 10.0000 mg | ORAL_TABLET | Freq: Every day | ORAL | 0 refills | Status: DC

## 2018-03-11 NOTE — Patient Instructions (Signed)

## 2018-03-11 NOTE — Progress Notes (Signed)
Patient: Misty Little Female    DOB: 08/12/1976   42 y.o.   MRN: [id number] Visit Date: 03/11/2018  Today's Provider: Trey SailorsAdriana M Jessiah Steinhart, PA-C   Chief Complaint  Patient presents with  . Anxiety   Subjective:    Misty Little is a 42 y/o woman presenting today to discuss anxiety. She reports extreme emotional distress. Reports a history of anxiety and depression that hasn't interfered with her life until now. Most recently, she has taken custody of her teenage niece. She has a 42 year old stepdaughter and a 31 year old son. She is currently moving into a new house. 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  Anxiety  Presents for initial visit. The problem has been gradually worsening. Symptoms include depressed mood, excessive worry, insomnia and nervous/anxious behavior. Patient reports no confusion, decreased concentration, dizziness or suicidal ideas.         Allergies  Allergen Reactions  . Other Other (See Comments)    Pt states that she is allergic to walnuts- Reaction:  Causes tongue to hurt.      Current Outpatient Medications:  .  albuterol (PROVENTIL HFA;VENTOLIN HFA) 108 (90 Base) MCG/ACT inhaler, Inhale 2 puffs into the lungs every 6 (six) hours as needed for wheezing or shortness of breath., Disp: 1 Inhaler, Rfl: 2 .  cholecalciferol (VITAMIN D) 1000 units tablet, Take 1,000 Units by mouth daily., Disp: , Rfl:  .  fluticasone (FLONASE) 50 MCG/ACT nasal spray, Place 2 sprays into both nostrils daily., Disp: 16 g, Rfl: 0 .  IRON PO, Take by mouth., Disp: , Rfl:  .  Magnesium 400 MG TABS, Take by mouth., Disp: , Rfl:  .  norgestimate-ethinyl estradiol (ORTHO-CYCLEN,SPRINTEC,PREVIFEM) 0.25-35 MG-MCG tablet, Take 1 tablet daily by mouth., Disp: 3 Package, Rfl: 0 .  SUMAtriptan (IMITREX) 50 MG tablet, TAKE 1 TABLET BY MOUTH AS DIRECTED, MAY REPEAT IN 2 HOURS IF HEADACHE PERSISTS OR RECURS, Disp: 10 tablet, Rfl: 0 .   metoCLOPramide (REGLAN) 10 MG tablet, Take 1 tablet (10 mg total) by mouth every 8 (eight) hours as needed for nausea., Disp: 20 tablet, Rfl: 0  Review of Systems  Constitutional: Negative.   Respiratory: Negative.   Cardiovascular: Negative.   Gastrointestinal: Negative.   Neurological: Negative for dizziness, light-headedness and headaches.  Psychiatric/Behavioral: Positive for sleep disturbance. Negative for agitation, behavioral problems, confusion, decreased concentration, dysphoric mood, hallucinations, self-injury and suicidal ideas. The patient is nervous/anxious and has insomnia.     Social History   Tobacco Use  . Smoking status: Never Smoker  . Smokeless tobacco: Never Used  Substance Use Topics  . Alcohol use: No    Comment: occasional before pregnancy   Objective:   BP 124/80 (BP Location: Right Arm, Patient Position: Sitting, Cuff Size: Large)   Pulse 76   Temp 98.7 F (37.1 C) (Oral)   Resp 16   Wt 242 lb (109.8 kg)   BMI 44.26 kg/m  Vitals:   03/11/18 0857  BP: 124/80  Pulse: 76  Resp: 16  Temp: 98.7 F (37.1 C)  TempSrc: Oral  Weight: 242 lb (109.8 kg)     Physical Exam  Constitutional: She is oriented to person, place, and time. She appears well-developed and well-nourished.  Cardiovascular: Normal rate and regular rhythm.  Pulmonary/Chest: Effort normal and breath sounds normal.  Neurological: She is alert and oriented to person, place, and time.  Skin: Skin is warm and dry.  Psychiatric: Her behavior is normal. Her mood appears anxious. She exhibits a depressed mood.  Tearful in office when speaking about her stressors.         Assessment & Plan:     1. Anxiety and depression  New problem, worsening.   - Ambulatory referral to Psychology - escitalopram (LEXAPRO) 10 MG tablet; Take 1 tablet (10 mg total) by mouth at bedtime.  Dispense: 90 tablet; Refill: 0  Return in about 1 month (around 04/10/2018) for anxiety and depression .  The  entirety of the information documented in the History of Present Illness, Review of Systems and Physical Exam were personally obtained by me. Portions of this information were initially documented by Kavin Leech, CMA and reviewed by me for thoroughness and accuracy.        Trey Sailors, PA-C  [facility]

## 2018-04-12 ENCOUNTER — Ambulatory Visit: Payer: Self-pay | Admitting: Physician Assistant

## 2018-04-18 ENCOUNTER — Ambulatory Visit: Payer: BLUE CROSS/BLUE SHIELD | Admitting: Physician Assistant

## 2018-04-18 ENCOUNTER — Encounter: Payer: Self-pay | Admitting: Physician Assistant

## 2018-04-18 VITALS — BP 126/62 | HR 68 | Temp 98.4°F | Resp 16 | Wt 245.0 lb

## 2018-04-18 DIAGNOSIS — F4320 Adjustment disorder, unspecified: Secondary | ICD-10-CM | POA: Diagnosis not present

## 2018-04-18 MED ORDER — ESCITALOPRAM OXALATE 20 MG PO TABS: 20.0000 mg | ORAL_TABLET | Freq: Every day | ORAL | 0 refills | Status: DC

## 2018-04-18 NOTE — Patient Instructions (Signed)

## 2018-04-18 NOTE — Progress Notes (Signed)
Patient: Misty Little Female    DOB: [birth year]   42 y.o.   MRN: [id number] Visit Date: 04/18/2018  Today's Provider: Trey Sailors, PA-C   Chief Complaint  Patient presents with  . Depression  . Anxiety   Subjective:    HPI   Patient comes in today for a follow up. She was seen in the office 4 weeks ago, and was started on Lexapro  daily. She reports good symptom control and good compliance. She does mention that it causes her to not get as much rest at night. She averages about 6 hours of sleep nightly. She declines counseling at this point due to not feeling she needs it and also scheduling conflicts. She is open     Allergies  Allergen Reactions  . Other Other (See Comments)    Pt states that she is allergic to walnuts- Reaction:  Causes tongue to hurt.      Current Outpatient Medications:  .  albuterol (PROVENTIL HFA;VENTOLIN HFA) 108 (90 Base) MCG/ACT inhaler, Inhale 2 puffs into the lungs every 6 (six) hours as needed for wheezing or shortness of breath., Disp: 1 Inhaler, Rfl: 2 .  cholecalciferol (VITAMIN D) 1000 units tablet, Take 1,000 Units by mouth daily., Disp: , Rfl:  .  fluticasone (FLONASE) 50 MCG/ACT nasal spray, Place 2 sprays into both nostrils daily., Disp: 16 g, Rfl: 0 .  IRON PO, Take by mouth., Disp: , Rfl:  .  Magnesium 400 MG TABS, Take by mouth., Disp: , Rfl:  .  norgestimate-ethinyl estradiol (ORTHO-CYCLEN,SPRINTEC,PREVIFEM) 0.25-35 MG-MCG tablet, Take 1 tablet daily by mouth., Disp: 3 Package, Rfl: 0 .  SUMAtriptan (IMITREX) 50 MG tablet, TAKE 1 TABLET BY MOUTH AS DIRECTED, MAY REPEAT IN 2 HOURS IF HEADACHE PERSISTS OR RECURS, Disp: 10 tablet, Rfl: 0 .  escitalopram (LEXAPRO) 10 MG tablet, Take 1 tablet (10 mg total) by mouth at bedtime., Disp: 90 tablet, Rfl: 0 .  metoCLOPramide (REGLAN) 10 MG tablet, Take 1 tablet (10 mg total) by mouth every 8 (eight) hours as needed for nausea., Disp: 20 tablet, Rfl: 0  Review of Systems    Constitutional: Positive for fatigue.  Respiratory: Negative.   Cardiovascular: Negative.   Neurological: Negative.   Psychiatric/Behavioral: Positive for sleep disturbance. Negative for agitation, behavioral problems, confusion, decreased concentration, self-injury and suicidal ideas. The patient is not nervous/anxious and is not hyperactive.     Social History   Tobacco Use  . Smoking status: Never Smoker  . Smokeless tobacco: Never Used  Substance Use Topics  . Alcohol use: No    Comment: occasional before pregnancy   Objective:   BP 126/62 (BP Location: Right Arm, Patient Position: Sitting, Cuff Size: Large)   Pulse 68   Temp 98.4 F (36.9 C)   Resp 16   Wt 245 lb (111.1 kg)   BMI 44.81 kg/m  Vitals:   04/18/18 1319  BP: 126/62  Pulse: 68  Resp: 16  Temp: 98.4 F (36.9 C)  Weight: 245 lb (111.1 kg)     Physical Exam  Constitutional: She is oriented to person, place, and time. She appears well-developed and well-nourished.  Cardiovascular: Normal rate and regular rhythm.  Pulmonary/Chest: Effort normal and breath sounds normal.  Neurological: She is alert and oriented to person, place, and time.  Skin: Skin is warm and dry.  Psychiatric: She has a normal mood and affect. Her behavior is normal.        Assessment &  Plan:     1. Adjustment disorder, unspecified type  Increased from 10 mg Lexapro to 20 mg lexapro. May try taking this in the morning to see if she has less sleep disturbance. Can take two of the 10 mg Lexapro daily until she runs out and then may pick up the 20 mg Lexpro. See her in one month to follow up.  - escitalopram (LEXAPRO) 20 MG tablet; Take 1 tablet (20 mg total) by mouth daily.  Dispense: 90 tablet; Refill: 0  Return in about 1 month (around 05/19/2018) for anxiety .  The entirety of the information documented in the History of Present Illness, Review of Systems and Physical Exam were personally obtained by me. Portions of this  information were initially documented by Anson [place], CMA  and reviewed by me for thoroughness and accuracy.           Trey Sailors, PA-C  [facility]

## 2018-06-07 ENCOUNTER — Other Ambulatory Visit: Payer: Self-pay | Admitting: Obstetrics and Gynecology

## 2018-06-07 DIAGNOSIS — Z3041 Encounter for surveillance of contraceptive pills: Secondary | ICD-10-CM

## 2018-06-09 ENCOUNTER — Telehealth: Payer: Self-pay | Admitting: Physician Assistant

## 2018-06-09 DIAGNOSIS — Q8589 Other phakomatoses, not elsewhere classified: Secondary | ICD-10-CM

## 2018-06-09 DIAGNOSIS — Q858 Other phakomatoses, not elsewhere classified: Secondary | ICD-10-CM

## 2018-06-09 NOTE — Telephone Encounter (Signed)
Pt calling  requesting to get a referral for a Neurologist. Pt states she has Sturge-Weber. Pt can be reached at [phone]. Thanks CC

## 2018-06-10 NOTE — Telephone Encounter (Signed)
Referral placed.

## 2018-06-12 ENCOUNTER — Other Ambulatory Visit: Payer: Self-pay | Admitting: Physician Assistant

## 2018-06-12 DIAGNOSIS — F329 Major depressive disorder, single episode, unspecified: Secondary | ICD-10-CM

## 2018-06-12 DIAGNOSIS — F419 Anxiety disorder, unspecified: Principal | ICD-10-CM

## 2018-07-19 DIAGNOSIS — Q858 Other phakomatoses, not elsewhere classified: Secondary | ICD-10-CM | POA: Diagnosis not present

## 2018-07-19 DIAGNOSIS — Z01818 Encounter for other preprocedural examination: Secondary | ICD-10-CM | POA: Diagnosis not present

## 2018-07-20 ENCOUNTER — Encounter: Payer: Self-pay | Admitting: Family Medicine

## 2018-07-20 ENCOUNTER — Ambulatory Visit (INDEPENDENT_AMBULATORY_CARE_PROVIDER_SITE_OTHER): Payer: BLUE CROSS/BLUE SHIELD | Admitting: Family Medicine

## 2018-07-20 VITALS — BP 128/70 | HR 70 | Temp 98.4°F | Resp 16 | Wt 252.0 lb

## 2018-07-20 DIAGNOSIS — G43009 Migraine without aura, not intractable, without status migrainosus: Secondary | ICD-10-CM

## 2018-07-20 MED ORDER — METOCLOPRAMIDE HCL 10 MG PO TABS: 10.0000 mg | ORAL_TABLET | Freq: Three times a day (TID) | ORAL | 0 refills | Status: DC | PRN

## 2018-07-20 MED ORDER — KETOROLAC TROMETHAMINE 30 MG/ML IJ SOLN: 30.0000 mg | Freq: Once | INTRAMUSCULAR | Status: AC

## 2018-07-20 MED ORDER — KETOROLAC TROMETHAMINE 10 MG PO TABS: 10.0000 mg | ORAL_TABLET | Freq: Four times a day (QID) | ORAL | 0 refills | Status: DC | PRN

## 2018-07-20 MED ORDER — SUMATRIPTAN SUCCINATE 50 MG PO TABS: ORAL_TABLET | ORAL | 1 refills | Status: DC

## 2018-07-20 NOTE — Patient Instructions (Signed)
Let us know if headache not resolving.

## 2018-07-20 NOTE — Progress Notes (Signed)
  Subjective:     Patient ID: Misty Little, female   DOB: 08/21/1976, 42 y.o.   MRN: [id number] Chief Complaint  Patient presents with  . Headache    Patient comes in today c/o migriaine headaches. She reports that she has had symptoms for 2-3 days. Patient also has nausea and fatigue. She reports that she has taken ibuprofen and her migraine medicatons with no relief.    HPI States it is like migraines she has had in the past. Current frequency is 0-1/monthly. Did not have sumatriptan so tried ketorolac and Reglan with minimal improvement. States she is pending right cataract surgery. She is accompanied by her young son today.  Review of Systems     Objective:   Physical Exam  Constitutional: She appears well-developed and well-nourished. No distress.  Eyes: EOM are normal.  Pupils reactive L > R  Musculoskeletal:  Grip strength 5/5 symmetrically       Assessment:    1. Migraine without aura and without status migrainosus, not intractable - SUMAtriptan (IMITREX) 50 MG tablet; TAKE 1 TABLET BY MOUTH AS DIRECTED, MAY REPEAT IN 2 HOURS IF HEADACHE PERSISTS OR RECURS  Dispense: 10 tablet; Refill: 1 - metoCLOPramide (REGLAN) 10 MG tablet; Take 1 tablet (10 mg total) by mouth every 8 (eight) hours as needed for up to 5 days for nausea.  Dispense: 20 tablet; Refill: 0 - ketorolac (TORADOL) 30 MG/ML injection 30 mg - ketorolac (TORADOL) 10 MG tablet; Take 1 tablet (10 mg total) by mouth every 6 (six) hours as needed. For migraine headache  Dispense: 20 tablet; Refill: 0    Plan:    F/u prn not improving.

## 2018-08-12 DIAGNOSIS — R404 Transient alteration of awareness: Secondary | ICD-10-CM | POA: Diagnosis not present

## 2018-08-12 DIAGNOSIS — R4189 Other symptoms and signs involving cognitive functions and awareness: Secondary | ICD-10-CM | POA: Diagnosis not present

## 2018-08-12 DIAGNOSIS — Q858 Other phakomatoses, not elsewhere classified: Secondary | ICD-10-CM | POA: Diagnosis not present

## 2018-08-12 DIAGNOSIS — R6889 Other general symptoms and signs: Secondary | ICD-10-CM | POA: Diagnosis not present

## 2018-08-29 DIAGNOSIS — H25041 Posterior subcapsular polar age-related cataract, right eye: Secondary | ICD-10-CM | POA: Diagnosis not present

## 2018-08-29 DIAGNOSIS — H2511 Age-related nuclear cataract, right eye: Secondary | ICD-10-CM | POA: Diagnosis not present

## 2018-08-29 DIAGNOSIS — H25811 Combined forms of age-related cataract, right eye: Secondary | ICD-10-CM | POA: Diagnosis not present

## 2018-08-31 DIAGNOSIS — R404 Transient alteration of awareness: Secondary | ICD-10-CM | POA: Diagnosis not present

## 2018-09-01 ENCOUNTER — Telehealth: Payer: Self-pay

## 2018-09-01 ENCOUNTER — Encounter: Payer: Self-pay | Admitting: Physician Assistant

## 2018-09-01 ENCOUNTER — Ambulatory Visit (INDEPENDENT_AMBULATORY_CARE_PROVIDER_SITE_OTHER): Payer: BLUE CROSS/BLUE SHIELD | Admitting: Physician Assistant

## 2018-09-01 VITALS — BP 122/72 | HR 65 | Temp 99.0°F | Resp 16 | Wt 256.0 lb

## 2018-09-01 DIAGNOSIS — R42 Dizziness and giddiness: Secondary | ICD-10-CM

## 2018-09-01 MED ORDER — MECLIZINE HCL 12.5 MG PO TABS: 12.5000 mg | ORAL_TABLET | Freq: Three times a day (TID) | ORAL | 0 refills | Status: DC | PRN

## 2018-09-01 NOTE — Patient Instructions (Addendum)

## 2018-09-01 NOTE — Telephone Encounter (Signed)
Pt denies speech difficulty, confusion, chest pain, nausea.  Pt is scheduled to come and see you this afternoon.  Her husband will drive her here.  I advised her if her symptoms worsening or if she experiences new symptoms to go to the ER.    Thanks,   -Vernona Rieger

## 2018-09-01 NOTE — Progress Notes (Signed)
Patient: Misty Little Female    DOB: 02/13/1976   42 y.o.   MRN: [id number] Visit Date: 09/01/2018  Today's Provider: Trey Sailors, PA-C   Chief Complaint  Patient presents with  . Dizziness   Subjective:    HPI   Dizziness: Patient comes in today with her husband complaining of dizziness and nausea that started this morning. He also complains of headaches. She reports having Cataract surgery 3 days ago. She called the eye surgeon who told her that her symptoms were not related to the surgery and to follow up with her PCP. She reports this morning she experienced the room spinning when she woke up and rolled over in bed to get her phone. She also experienced a similar sensation when lying down. She feels nauseated when this occurs. She currently does have a headache which is not the worst headache of her life. She has a history of migraines and has frequent headaches. She denies one sided vision loss or change. She denies vomiting. She denies discoordination or falls. She denies problems with her speech. She has periods of normalcy between these episodes of dizziness.     Allergies  Allergen Reactions  . Other Other (See Comments)    Pt states that she is allergic to walnuts- Reaction:  Causes tongue to hurt.      Current Outpatient Medications:  .  albuterol (PROVENTIL HFA;VENTOLIN HFA) 108 (90 Base) MCG/ACT inhaler, Inhale 2 puffs into the lungs every 6 (six) hours as needed for wheezing or shortness of breath., Disp: 1 Inhaler, Rfl: 2 .  cholecalciferol (VITAMIN D) 1000 units tablet, Take 1,000 Units by mouth daily., Disp: , Rfl:  .  escitalopram (LEXAPRO) 10 MG tablet, TAKE 1 TABLET(10 MG) BY MOUTH AT BEDTIME, Disp: 90 tablet, Rfl: 0 .  ESTARYLLA 0.25-35 MG-MCG tablet, TAKE 1 TABLET BY MOUTH DAILY, Disp: 84 tablet, Rfl: 1 .  fluticasone (FLONASE) 50 MCG/ACT nasal spray, Place 2 sprays into both nostrils daily., Disp: 16 g, Rfl: 0 .  IRON PO, Take by mouth., Disp: , Rfl:    .  ketorolac (TORADOL) 10 MG tablet, Take 1 tablet (10 mg total) by mouth every 6 (six) hours as needed. For migraine headache, Disp: 20 tablet, Rfl: 0 .  Magnesium 400 MG TABS, Take by mouth., Disp: , Rfl:  .  promethazine (PHENERGAN) 25 MG tablet, Take by mouth. Take 1 tablet (25 mg total) by mouth as needed for Nausea, Disp: , Rfl:  .  SUMAtriptan (IMITREX) 50 MG tablet, TAKE 1 TABLET BY MOUTH AS DIRECTED, MAY REPEAT IN 2 HOURS IF HEADACHE PERSISTS OR RECURS, Disp: 10 tablet, Rfl: 1 .  topiramate (TOPAMAX) 50 MG tablet, Take by mouth. Take 1 tablet (50 mg total) by mouth once daily Take half tablet for two weeks then increase to 1 whole tablet, Disp: , Rfl:  .  metoCLOPramide (REGLAN) 10 MG tablet, Take 1 tablet (10 mg total) by mouth every 8 (eight) hours as needed for up to 5 days for nausea., Disp: 20 tablet, Rfl: 0  Review of Systems  Constitutional: Negative for appetite change, chills, fatigue and fever.  Respiratory: Negative for chest tightness and shortness of breath.   Cardiovascular: Negative for chest pain and palpitations.  Gastrointestinal: Positive for nausea. Negative for abdominal pain and vomiting.  Neurological: Positive for dizziness and headaches. Negative for weakness.       Tingling sensation on the bottom of her feet x 1 week  Social History   Tobacco Use  . Smoking status: Never Smoker  . Smokeless tobacco: Never Used  Substance Use Topics  . Alcohol use: No    Comment: occasional before pregnancy   Objective:   BP 122/72 (BP Location: Left Arm, Patient Position: Sitting, Cuff Size: Large)   Pulse 65   Temp 99 F (37.2 C) (Oral)   Resp 16   Wt 256 lb (116.1 kg)   LMP 08/25/2018   SpO2 98% Comment: room air  BMI 46.82 kg/m  Vitals:   09/01/18 1410  BP: 122/72  Pulse: 65  Resp: 16  Temp: 99 F (37.2 C)  TempSrc: Oral  SpO2: 98%  Weight: 256 lb (116.1 kg)     Physical Exam  Constitutional: She is oriented to person, place, and time. She  appears well-developed and well-nourished.  Eyes: Pupils are equal, round, and reactive to light. EOM are normal.  Cardiovascular: Normal rate and regular rhythm.  Pulmonary/Chest: Effort normal and breath sounds normal.  Neurological: She is alert and oriented to person, place, and time. She displays normal reflexes. No cranial nerve deficit or sensory deficit. She exhibits normal muscle tone. Coordination and gait normal.  Finger nose finger in tact. No nystagmus.   Skin: Skin is warm and dry.  Psychiatric: She has a normal mood and affect. Her behavior is normal.        Assessment & Plan:     1. Vertigo  She has no focal neuro deficits today. She does have a headache, though not the worst in her life and she frequently has headaches and migraines. She has episodes of dizziness that are very strongly associated with head movement. Do feel this represents vertigo and will treat as below. Have counseled on return precautions. She has phenergan from her surgery and will take this for nausea.  - meclizine (ANTIVERT) 12.5 MG tablet; Take 1 tablet (12.5 mg total) by mouth 3 (three) times daily as needed for dizziness.  Dispense: 30 tablet; Refill: 0  Return if symptoms worsen or fail to improve.  The entirety of the information documented in the History of Present Illness, Review of Systems and Physical Exam were personally obtained by me. Portions of this information were initially documented by Awilda Bill, CMA and reviewed by me for thoroughness and accuracy.        Trey Sailors, PA-C  [facility]

## 2018-09-01 NOTE — Telephone Encounter (Signed)
Pt called in complaining of dizziness that started last night.  She states it is worse when she laying down.  She had "eye surgery" Monday.  She did call the surgeon and advised her she should not be feeling dizzy because of the surgery and to call her PCP.  She states her only symptom is dizziness with a mild headache (Not the worst ever)

## 2018-10-24 ENCOUNTER — Telehealth: Payer: Self-pay

## 2018-10-24 ENCOUNTER — Encounter: Payer: Self-pay | Admitting: Emergency Medicine

## 2018-10-24 ENCOUNTER — Other Ambulatory Visit: Payer: Self-pay | Admitting: Physician Assistant

## 2018-10-24 ENCOUNTER — Emergency Department
Admission: EM | Admit: 2018-10-24 | Discharge: 2018-10-24 | Disposition: A | Discharge status: Home / Self Care | Payer: BLUE CROSS/BLUE SHIELD | Attending: Emergency Medicine | Admitting: Emergency Medicine

## 2018-10-24 ENCOUNTER — Emergency Department: Payer: BLUE CROSS/BLUE SHIELD

## 2018-10-24 DIAGNOSIS — F419 Anxiety disorder, unspecified: Principal | ICD-10-CM

## 2018-10-24 DIAGNOSIS — R079 Chest pain, unspecified: Secondary | ICD-10-CM

## 2018-10-24 DIAGNOSIS — Z79899 Other long term (current) drug therapy: Secondary | ICD-10-CM | POA: Insufficient documentation

## 2018-10-24 DIAGNOSIS — R0789 Other chest pain: Secondary | ICD-10-CM | POA: Diagnosis not present

## 2018-10-24 DIAGNOSIS — F329 Major depressive disorder, single episode, unspecified: Secondary | ICD-10-CM

## 2018-10-24 DIAGNOSIS — M940 Chondrocostal junction syndrome [Tietze]: Secondary | ICD-10-CM | POA: Insufficient documentation

## 2018-10-24 LAB — BASIC METABOLIC PANEL
Anion gap: 6 (ref 5–15)
BUN: 19 mg/dL (ref 6–20)
CALCIUM: 8.6 mg/dL — AB (ref 8.9–10.3)
CO2: 26 mmol/L (ref 22–32)
CREATININE: 0.78 mg/dL (ref 0.44–1.00)
Chloride: 107 mmol/L (ref 98–111)
Glucose, Bld: 87 mg/dL (ref 70–99)
Potassium: 3.7 mmol/L (ref 3.5–5.1)
SODIUM: 139 mmol/L (ref 135–145)

## 2018-10-24 LAB — CBC
HCT: 39 % (ref 36.0–46.0)
Hemoglobin: 13.2 g/dL (ref 12.0–15.0)
MCH: 32 pg (ref 26.0–34.0)
MCHC: 33.8 g/dL (ref 30.0–36.0)
MCV: 94.4 fL (ref 80.0–100.0)
NRBC: 0 % (ref 0.0–0.2)
PLATELETS: 219 10*3/uL (ref 150–400)
RBC: 4.13 MIL/uL (ref 3.87–5.11)
RDW: 12.7 % (ref 11.5–15.5)
WBC: 6.3 10*3/uL (ref 4.0–10.5)

## 2018-10-24 LAB — TROPONIN I

## 2018-10-24 MED ORDER — CYCLOBENZAPRINE HCL 5 MG PO TABS: 5.0000 mg | ORAL_TABLET | Freq: Three times a day (TID) | ORAL | 0 refills | Status: DC | PRN

## 2018-10-24 MED ORDER — KETOROLAC TROMETHAMINE 60 MG/2ML IM SOLN
60.0000 mg | Freq: Once | INTRAMUSCULAR | Status: AC
  Administered 2018-10-24: 60 mg via INTRAMUSCULAR
  Filled 2018-10-24: qty 2

## 2018-10-24 NOTE — Telephone Encounter (Signed)
Patient walked in with c/o of chest pain per Adriana patient to go to the ED. Patient agreed.  Thanks,  -Katrice Goel

## 2018-10-24 NOTE — Telephone Encounter (Signed)
Patient called office today with concerns of chest pain that is in the middle of her chest, patient states that pain began around 9AM after moving queen mattress. Patient states that pain is gradually getting worse and has had pain when taking deep breaths and complains now of headache. No one has any availability this afternoon please advise if patient needs to be seen right away or at Urgent care facility? KW

## 2018-10-24 NOTE — Discharge Instructions (Addendum)
Please seek medical attention for any high fevers, chest pain, shortness of breath, change in behavior, persistent vomiting, bloody stool or any other new or concerning symptoms.  

## 2018-10-24 NOTE — ED Triage Notes (Signed)
C/O mid chest pain x 1 day.   States pain started after moving an air mattress.  AAOx3.  Skin warm and dry. NAD

## 2018-10-24 NOTE — ED Provider Notes (Signed)
[facility] Emergency Department Provider Note   ____________________________________________   I have reviewed the triage vital signs and the nursing notes.   HISTORY  Chief Complaint Chest Pain   History limited by: Not Limited   HPI Misty Little is a 42 y.o. female who presents to the emergency department today because of concern for chest pain. Located in the center chest. Started this morning when she was moving an air mattress. The pain is worse when she moves. Denies any leg swelling. Did have a small bruise around the area of her left knee without any known or remembered trauma. No fevers.   Per medical record review patient has a history of migraine  Past Medical History:  Diagnosis Date  . Anemia   . Headache(784.0)   . Infection    uti  . Menstrual migraine   . Seizures (HCC)    "small ones as child"  . 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 Absence of menstruation 08/19/2015  . Blood pressure elevated 08/19/2015  . Glaucoma 08/19/2015  . Personal history of disease of skin and subcutaneous tissue 08/19/2015  . Edema of foot 08/19/2015  . S/P cesarean section 09/06/2014  . Gestational hypertension 08/30/2014  . Dizziness 05/04/2014  . Obesity 04/03/2014  . Antepartum multigravida of advanced maternal age 64/09/2014  . 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 Rh negative state in antepartum period 01/03/2014    Past Surgical History:  Procedure Laterality Date  . CESAREAN SECTION N/A 09/02/2014   Procedure: CESAREAN SECTION;  Surgeon: Catalina Antigua, MD;  Location: [facility] ORS;  Service: Obstetrics;  Laterality: N/A;  . EYE SURGERY  1977  . EYE SURGERY Right 2010   glaucoma  . TOE SURGERY  1977    Prior to Admission medications   Medication Sig Start Date End Date Taking? Authorizing Provider  albuterol (PROVENTIL HFA;VENTOLIN HFA) 108 (90 Base) MCG/ACT inhaler Inhale  2 puffs into the lungs every 6 (six) hours as needed for wheezing or shortness of breath. 02/18/18   Trey Sailors, PA-C  cholecalciferol (VITAMIN D) 1000 units tablet Take 1,000 Units by mouth daily.    [provider]  escitalopram (LEXAPRO) 10 MG tablet TAKE 1 TABLET(10 MG) BY MOUTH AT BEDTIME 06/13/18   Trey Sailors, PA-C  ESTARYLLA 0.25-35 MG-MCG tablet TAKE 1 TABLET BY MOUTH DAILY 06/07/18   Copland, Alicia B, PA-C  fluticasone (FLONASE) 50 MCG/ACT nasal spray Place 2 sprays into both nostrils daily. 10/25/17   Anola Gurney, PA  IRON PO Take by mouth.    [provider]  ketorolac (TORADOL) 10 MG tablet Take 1 tablet (10 mg total) by mouth every 6 (six) hours as needed. For migraine headache 07/20/18   Anola Gurney, PA  Magnesium 400 MG TABS Take by mouth.    [provider]  meclizine (ANTIVERT) 12.5 MG tablet Take 1 tablet (12.5 mg total) by mouth 3 (three) times daily as needed for dizziness. 09/01/18   Trey Sailors, PA-C  metoCLOPramide (REGLAN) 10 MG tablet Take 1 tablet (10 mg total) by mouth every 8 (eight) hours as needed for up to 5 days for nausea. 07/20/18 07/25/18  Anola Gurney, PA  promethazine (PHENERGAN) 25 MG tablet Take by mouth. Take 1 tablet (25 mg total) by mouth as needed for Nausea 08/12/18   [provider]  SUMAtriptan (IMITREX) 50 MG tablet TAKE 1 TABLET BY MOUTH  AS DIRECTED, MAY REPEAT IN 2 HOURS IF HEADACHE PERSISTS OR RECURS 07/20/18   Anola Gurneyhauvin, Robert, PA  topiramate (TOPAMAX) 50 MG tablet Take by mouth. Take 1 tablet (50 mg total) by mouth once daily Take half tablet for two weeks then increase to 1 whole tablet 08/12/18 09/11/18  [provider]    Allergies Other  Family History  Problem Relation Age of Onset  . Heart disease Mother   . Diabetes Maternal Grandmother   . Alzheimer's disease Maternal Grandmother   . Hypertension Father   . Healthy Son   . Alzheimer's disease Maternal Grandfather   .  Parkinsonism Maternal Grandfather   . Healthy Sister   . Diabetes Maternal Aunt   . Hearing loss Neg Hx     Social History Social History   Tobacco Use  . Smoking status: Never Smoker  . Smokeless tobacco: Never Used  Substance Use Topics  . Alcohol use: No    Comment: occasional before pregnancy  . Drug use: No    Review of Systems Constitutional: No fever/chills Eyes: No visual changes. ENT: No sore throat. Cardiovascular: Positive for central chest pain. Respiratory: Denies shortness of breath. Gastrointestinal: No abdominal pain.  No nausea, no vomiting.  No diarrhea.   Genitourinary: Negative for dysuria. Musculoskeletal: Negative for back pain. Skin: Positive for bruise to left knee. Neurological: Negative for headaches, focal weakness or numbness.  ____________________________________________   PHYSICAL EXAM:  VITAL SIGNS: ED Triage Vitals  Enc Vitals Group     BP 10/24/18 1317 114/64     Pulse Rate 10/24/18 1317 81     Resp 10/24/18 1317 18     Temp 10/24/18 1317 98.2 F (36.8 C)     Temp Source 10/24/18 1317 Oral     SpO2 10/24/18 1317 98 %     Weight 10/24/18 1311 255 lb 15.3 oz (116.1 kg)     Height 10/24/18 1317 5\' 1"  (1.549 m)     Head Circumference --      Peak Flow --      Pain Score 10/24/18 1311 7   Constitutional: Alert and oriented.  Eyes: Conjunctivae are normal.  ENT      Head: Normocephalic and atraumatic.      Nose: No congestion/rhinnorhea.      Mouth/Throat: Mucous membranes are moist.      Neck: No stridor. Hematological/Lymphatic/Immunilogical: No cervical lymphadenopathy. Cardiovascular: Normal rate, regular rhythm.  No murmurs, rubs, or gallops.  Respiratory: Normal respiratory effort without tachypnea nor retractions. Breath sounds are clear and equal bilaterally. No wheezes/rales/rhonchi. Gastrointestinal: Soft and non tender. No rebound. No guarding.  Genitourinary: Deferred Musculoskeletal: Normal range of motion in all  extremities. No lower extremity edema. Neurologic:  Normal speech and language. No gross focal neurologic deficits are appreciated.  Skin:  Small bruise noted to lateral left knee. No tenderness.  Psychiatric: Mood and affect are normal. Speech and behavior are normal. Patient exhibits appropriate insight and judgment.  ____________________________________________    LABS (pertinent positives/negatives)  Trop <0.03 CBC wnl BMP wnl except ca 8.6  ____________________________________________   EKG  I, Phineas SemenGraydon Rishab Stoudt, attending physician, personally viewed and interpreted this EKG  EKG Time: 1314 Rate: 83 Rhythm: normal sinus rhythm Axis: normal Intervals: qtc 455 QRS: narrow ST changes: no st elevation Impression: normal ekg   ____________________________________________    RADIOLOGY  CXR No acute disease   ____________________________________________   PROCEDURES  Procedures  ____________________________________________   INITIAL IMPRESSION / ASSESSMENT AND PLAN / ED  COURSE  Pertinent labs & imaging results that were available during my care of the patient were reviewed by me and considered in my medical decision making (see chart for details).   Patient presented to the emergency department today because of concerns for chest pain.  Differential would be broad including pneumonia, costochondritis, PE, pneumothorax, esophagitis, amongst other etiologies.  Patient's chest x-ray blood work without concerning findings.  EKG without concerning findings.  Given that the pain started while the patient was moving an air mattress do think musculoskeletal cause of the pain likely.  Discussed this with the patient.  She states she has anti-inflammatories at home.  Will also add on muscle relaxers.  Discussed sedating properties and muscle relaxers.  ____________________________________________   FINAL CLINICAL IMPRESSION(S) / ED DIAGNOSES  Final diagnoses:   Nonspecific chest pain  Costochondritis     Note: This dictation was prepared with Dragon dictation. Any transcriptional errors that result from this process are unintentional     Phineas Semen, MD 10/24/18 1714

## 2018-10-24 NOTE — ED Notes (Signed)
Pt presents to ED with c/o sudden onset substernal chest pain since approx 0900 this morning after moving an air mattress. CP is reproducable with palpation. Pt c/o some nausea since this morning as well. Pt also c/o cough and increased bruising recently.

## 2018-12-02 DIAGNOSIS — R200 Anesthesia of skin: Secondary | ICD-10-CM | POA: Diagnosis not present

## 2018-12-02 DIAGNOSIS — R202 Paresthesia of skin: Secondary | ICD-10-CM | POA: Diagnosis not present

## 2019-01-23 ENCOUNTER — Other Ambulatory Visit: Payer: Self-pay | Admitting: Physician Assistant

## 2019-01-23 DIAGNOSIS — F419 Anxiety disorder, unspecified: Principal | ICD-10-CM

## 2019-01-23 DIAGNOSIS — F329 Major depressive disorder, single episode, unspecified: Secondary | ICD-10-CM

## 2019-03-06 ENCOUNTER — Ambulatory Visit (INDEPENDENT_AMBULATORY_CARE_PROVIDER_SITE_OTHER): Payer: 59 | Admitting: Physician Assistant

## 2019-03-06 VITALS — Ht 64.0 in | Wt 261.0 lb

## 2019-03-06 DIAGNOSIS — Z20822 Contact with and (suspected) exposure to covid-19: Secondary | ICD-10-CM

## 2019-03-06 DIAGNOSIS — R05 Cough: Secondary | ICD-10-CM | POA: Diagnosis not present

## 2019-03-06 DIAGNOSIS — Z20828 Contact with and (suspected) exposure to other viral communicable diseases: Secondary | ICD-10-CM | POA: Diagnosis not present

## 2019-03-06 DIAGNOSIS — R059 Cough, unspecified: Secondary | ICD-10-CM

## 2019-03-06 NOTE — Patient Instructions (Signed)
This information is directly available on the CDC website: https://www.cdc.gov/coronavirus/2019-ncov/if-you-are-sick/steps-when-sick.html    Source:CDC Reference to specific commercial products, manufacturers, companies, or trademarks does not constitute its endorsement or recommendation by the U.S. Government, Department of Health and Human Services, or Centers for Disease Control and Prevention.  

## 2019-03-06 NOTE — Progress Notes (Signed)
Patient: Misty Little Female    DOB: 1976/06/18   43 y.o.   MRN: [id number] Visit Date: 03/06/2019  Today's Provider: Trey Sailors, PA-C   Chief Complaint  Patient presents with  . URI   Subjective:     HPI Upper Respiratory Infection: Patient complains of symptoms of a URI, possible sinusitis. Symptoms include congestion, cough and headache. Onset of symptoms was 2 days ago, gradually worsening since that time. She also c/o cough described as nonproductive and worsening over time and sore throat for the past 1 day .  She is drinking plenty of fluids. Evaluation to date: none. Treatment to date: tylenol. Reports mostly headache and head congestion. She is taking flonase but has not taken claritin in the past few days. 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 Her son is also sick with similar symptoms. She denies Sob or chest pain, denies wheezing. Reports some nausea but no fever, muscle aches, or vomiting.    Allergies  Allergen Reactions  . Black Walnut Pollen Allergy Skin Test     Other reaction(s): Unknown  . Other Other (See Comments)    Pt states that she is allergic to walnuts- Reaction:  Causes tongue to hurt.      Current Outpatient Medications:  .  albuterol (PROVENTIL HFA;VENTOLIN HFA) 108 (90 Base) MCG/ACT inhaler, Inhale 2 puffs into the lungs every 6 (six) hours as needed for wheezing or shortness of breath., Disp: 1 Inhaler, Rfl: 2 .  cholecalciferol (VITAMIN D) 1000 units tablet, Take 1,000 Units by mouth daily., Disp: , Rfl:  .  cyclobenzaprine (FLEXERIL) 5 MG tablet, Take 1 tablet (5 mg total) by mouth 3 (three) times daily as needed (muscle pain)., Disp: 20 tablet, Rfl: 0 .  escitalopram (LEXAPRO) 10 MG tablet, TAKE 1 TABLET(10 MG) BY MOUTH AT BEDTIME, Disp: 90 tablet, Rfl: 0 .  ESTARYLLA  0.25-35 MG-MCG tablet, TAKE 1 TABLET BY MOUTH DAILY, Disp: 84 tablet, Rfl: 1 .  fluticasone (FLONASE) 50 MCG/ACT nasal spray, Place 2 sprays into both nostrils daily., Disp: 16 g, Rfl: 0 .  IRON PO, Take by mouth., Disp: , Rfl:  .  ketorolac (TORADOL) 10 MG tablet, Take 1 tablet (10 mg total) by mouth every 6 (six) hours as needed. For migraine headache, Disp: 20 tablet, Rfl: 0 .  Magnesium 400 MG TABS, Take by mouth., Disp: , Rfl:  .  meclizine (ANTIVERT) 12.5 MG tablet, Take 1 tablet (12.5 mg total) by mouth 3 (three) times daily as needed for dizziness., Disp: 30 tablet, Rfl: 0 .  promethazine (PHENERGAN) 25 MG tablet, Take by mouth. Take 1 tablet (25 mg total) by mouth as needed for Nausea, Disp: , Rfl:  .  SUMAtriptan (IMITREX) 50 MG tablet, TAKE 1 TABLET BY MOUTH AS DIRECTED, MAY REPEAT IN 2 HOURS IF HEADACHE PERSISTS OR RECURS, Disp: 10 tablet, Rfl: 1 .  topiramate (TOPAMAX) 50 MG tablet, Take by mouth. Take 1 tablet (50 mg total) by mouth once daily Take half tablet for two weeks then increase to 1 whole tablet, Disp: , Rfl:  .  metoCLOPramide (REGLAN) 10 MG tablet, Take 1 tablet (10 mg total) by mouth every 8 (eight) hours as needed for up to 5 days for nausea., Disp: 20 tablet, Rfl: 0  Review of Systems  HENT: Positive for congestion and sore throat.   Respiratory: Positive for cough.   Neurological: Positive for headaches.    Social History   Tobacco Use  . Smoking status: Never Smoker  . Smokeless tobacco: Never Used  Substance Use Topics  . Alcohol use: No    Comment: occasional before pregnancy      Objective:   Ht 5\' 4"  (1.626 m)   Wt 261 lb (118.4 kg)   BMI 44.80 kg/m  Vitals:   03/06/19 1352  Weight: 261 lb (118.4 kg)  Height: 5\' 4"  (1.626 m)     Physical Exam Constitutional:      Comments: Sounds congested.   Neurological:     Mental Status: She is alert.  Psychiatric:        Mood and Affect: Mood normal.         Assessment & Plan    1. Cough   Will place on quarantine due to her symptoms, high risk job, and husband's exposure as well. Note provided. Counseled about symptomatic treatment including cough medication, tylenol, and use of albuterol inhaler. Note provided. Return precautions counseled.   2. Exposure to Covid-19 Virus  The entirety of the information documented in the History of Present Illness, Review of Systems and Physical Exam were personally obtained by me. Portions of this information were initially documented by Rondel Baton, CMA and reviewed by me for thoroughness and accuracy.   F/u PRN.       Trey Sailors, PA-C  [facility]

## 2019-03-08 ENCOUNTER — Telehealth: Payer: Self-pay

## 2019-03-08 DIAGNOSIS — H9209 Otalgia, unspecified ear: Secondary | ICD-10-CM

## 2019-03-08 DIAGNOSIS — J40 Bronchitis, not specified as acute or chronic: Secondary | ICD-10-CM

## 2019-03-08 NOTE — Telephone Encounter (Signed)
Pt called and stated her son's Covid test was negative.    She also called to report increase ear pain.  She denies drainage or hearing loss.  She states she continues to have a "Low Grade fever" of 99.8.  She would like an antibiotic sent into [facility] to treat an ear infection.    Please advise.  Thanks,   -Vernona Rieger

## 2019-03-08 NOTE — Telephone Encounter (Signed)
Please review

## 2019-03-09 MED ORDER — AZITHROMYCIN 250 MG PO TABS: ORAL_TABLET | ORAL | 0 refills | Status: DC

## 2019-03-09 NOTE — Telephone Encounter (Signed)
Yes if she is having SOB and acute worsening to the point that she needs to be seen, it should be urgent care. She should take the albuterol scheduled every 4 hours even if she doesn't feel like she needs it. Ear pain can be related to the virus and congestion, but I will send her in azithromycin to take if the ear pain does not resolve after a few days.

## 2019-03-09 NOTE — Telephone Encounter (Signed)
Patient called again and states that she has started wheezing and she wanted to know if she should be seen since her symptoms has changed. I advised patient if she is having SOB she would need to go to emergency room.

## 2019-03-10 NOTE — Telephone Encounter (Signed)
Patient notified about medication being sent to the pharmacy. Husband states that he sent you a message this morning through MyChart about his symptoms also.  Four year old son tested positive for strep pneumonia yesterday. Will her antibiotics need to be changed?

## 2019-03-13 NOTE — Telephone Encounter (Signed)
No they don't the antibiotic given should cover that bacteria.

## 2019-03-13 NOTE — Telephone Encounter (Signed)
Patient notified

## 2019-03-17 ENCOUNTER — Encounter: Payer: Self-pay | Admitting: Physician Assistant

## 2019-03-17 ENCOUNTER — Ambulatory Visit (INDEPENDENT_AMBULATORY_CARE_PROVIDER_SITE_OTHER): Payer: 59 | Admitting: Physician Assistant

## 2019-03-17 VITALS — BP 137/87 | HR 70 | Temp 98.3°F

## 2019-03-17 DIAGNOSIS — J069 Acute upper respiratory infection, unspecified: Secondary | ICD-10-CM

## 2019-03-17 NOTE — Progress Notes (Signed)
Patient: Misty Little Female    DOB: 11/24/1976   43 y.o.   MRN: [id number] Visit Date: 03/21/2019  Today's Provider: Trey SailorsAdriana M Pollak, PA-C   Chief Complaint  Patient presents with  . URI   Subjective:    Virtual Visit via Telephone Note  I connected with Misty Bolsterina L Shupert on 03/21/19 at 10:20 AM EDT by telephone and verified that I am speaking with the correct person using two identifiers.   I discussed the limitations, risks, security and privacy concerns of performing an evaluation and management service by telephone and the availability of in person appointments. I also discussed with the patient that there may be a patient responsible charge related to this service. The patient expressed understanding and agreed to proceed.  HPI   Patient need clearance to return to work. Patient is still coughing and some wheezing. Has completed z-pak for sinusitis. Temperature 98.3. Was placed off work for two weeks due to presumed COVID. Feeling mostly better but still mild cough. No fever in the past three days, not using fever reducers. Would like to return to work 03/23/2019.  Allergies  Allergen Reactions  . Black Walnut Pollen Allergy Skin Test     Other reaction(s): Unknown  . Other Other (See Comments)    Pt states that she is allergic to walnuts- Reaction:  Causes tongue to hurt.      Current Outpatient Medications:  .  albuterol (PROVENTIL HFA;VENTOLIN HFA) 108 (90 Base) MCG/ACT inhaler, Inhale 2 puffs into the lungs every 6 (six) hours as needed for wheezing or shortness of breath., Disp: 1 Inhaler, Rfl: 2 .  cholecalciferol (VITAMIN D) 1000 units tablet, Take 1,000 Units by mouth daily., Disp: , Rfl:  .  cyclobenzaprine (FLEXERIL) 5 MG tablet, Take 1 tablet (5 mg total) by mouth 3 (three) times daily as needed (muscle pain)., Disp: 20 tablet, Rfl: 0 .  escitalopram (LEXAPRO) 10 MG tablet, TAKE 1 TABLET(10 MG) BY MOUTH AT BEDTIME, Disp: 90 tablet, Rfl: 0 .  ESTARYLLA 0.25-35  MG-MCG tablet, TAKE 1 TABLET BY MOUTH DAILY, Disp: 84 tablet, Rfl: 1 .  fluticasone (FLONASE) 50 MCG/ACT nasal spray, Place 2 sprays into both nostrils daily., Disp: 16 g, Rfl: 0 .  IRON PO, Take by mouth., Disp: , Rfl:  .  ketorolac (TORADOL) 10 MG tablet, Take 1 tablet (10 mg total) by mouth every 6 (six) hours as needed. For migraine headache, Disp: 20 tablet, Rfl: 0 .  Magnesium 400 MG TABS, Take by mouth., Disp: , Rfl:  .  promethazine (PHENERGAN) 25 MG tablet, Take by mouth. Take 1 tablet (25 mg total) by mouth as needed for Nausea, Disp: , Rfl:  .  SUMAtriptan (IMITREX) 50 MG tablet, TAKE 1 TABLET BY MOUTH AS DIRECTED, MAY REPEAT IN 2 HOURS IF HEADACHE PERSISTS OR RECURS, Disp: 10 tablet, Rfl: 1 .  topiramate (TOPAMAX) 50 MG tablet, Take by mouth. Take 1 tablet (50 mg total) by mouth once daily Take half tablet for two weeks then increase to 1 whole tablet, Disp: , Rfl:  .  vitamin B-12 (CYANOCOBALAMIN) 500 MCG tablet, Take 500 mcg by mouth daily., Disp: , Rfl:  .  azithromycin (ZITHROMAX) 250 MG tablet, Take 2 pills on day 1, then 1 pill on each of the following 4 days. (Patient not taking: Reported on 03/17/2019), Disp: 6 tablet, Rfl: 0 .  meclizine (ANTIVERT) 12.5 MG tablet, Take 1 tablet (12.5 mg total) by mouth 3 (three)  times daily as needed for dizziness., Disp: 30 tablet, Rfl: 0 .  metoCLOPramide (REGLAN) 10 MG tablet, Take 1 tablet (10 mg total) by mouth every 8 (eight) hours as needed for up to 5 days for nausea., Disp: 20 tablet, Rfl: 0  Review of Systems  Respiratory: Positive for cough and wheezing.   All other systems reviewed and are negative.   Social History   Tobacco Use  . Smoking status: Never Smoker  . Smokeless tobacco: Never Used  Substance Use Topics  . Alcohol use: No    Comment: occasional before pregnancy      Objective:   BP 137/87 (BP Location: Left Arm, Patient Position: Sitting, Cuff Size: Normal)   Pulse 70   Temp 98.3 F (36.8 C) (Oral)   Vitals:   03/17/19 0819  BP: 137/87  Pulse: 70  Temp: 98.3 F (36.8 C)  TempSrc: Oral     Physical Exam Constitutional:      Appearance: Normal appearance.  Pulmonary:     Effort: Pulmonary effort is normal. No respiratory distress.  Neurological:     Mental Status: She is alert.  Psychiatric:        Mood and Affect: Mood normal.        Behavior: Behavior normal.         Assessment & Plan    1. Upper respiratory tract infection, unspecified type  Work note provided.   Patient location: home Provider location: [facility]/home office  Persons involved in the visit: patient, provider   The entirety of the information documented in the History of Present Illness, Review of Systems and Physical Exam were personally obtained by me. Portions of this information were initially documented by Lexine Baton, LPN and reviewed by me for thoroughness and accuracy.   F/u PRN.     Trey Sailors, PA-C  [facility]

## 2019-03-20 ENCOUNTER — Telehealth: Payer: Self-pay | Admitting: *Deleted

## 2019-03-20 NOTE — Telephone Encounter (Signed)
Sent letter through my chart

## 2019-03-20 NOTE — Telephone Encounter (Signed)
Patient called concerning a letter she was supposed to get in [place]. Letter was for her to go back to work 03/23/2019. Patient states letter is not in [place]. Please advise?

## 2019-03-20 NOTE — Telephone Encounter (Signed)
Please advise 

## 2019-03-21 NOTE — Patient Instructions (Signed)

## 2019-03-24 ENCOUNTER — Telehealth: Payer: Self-pay

## 2019-03-24 NOTE — Telephone Encounter (Signed)
Patient is requesting 5 years of her medical records be sent to [facility], Dr. Lorie Phenix for possible upcoming weight loss procedure. [phone]

## 2019-03-30 DIAGNOSIS — Z6841 Body Mass Index (BMI) 40.0 and over, adult: Secondary | ICD-10-CM | POA: Diagnosis not present

## 2019-03-30 DIAGNOSIS — Z7189 Other specified counseling: Secondary | ICD-10-CM | POA: Diagnosis not present

## 2019-04-11 ENCOUNTER — Telehealth: Payer: Self-pay | Admitting: Physician Assistant

## 2019-04-11 DIAGNOSIS — G43009 Migraine without aura, not intractable, without status migrainosus: Secondary | ICD-10-CM

## 2019-04-11 MED ORDER — SUMATRIPTAN SUCCINATE 50 MG PO TABS: ORAL_TABLET | ORAL | 1 refills | Status: DC

## 2019-04-11 NOTE — Telephone Encounter (Signed)
Which [facility] she [facility], [place] or [place]? She can have a refill.

## 2019-04-11 NOTE — Telephone Encounter (Signed)
[facility] faxed refill request for the following medications:  SUMAtriptan (IMITREX) 50 MG tablet     Please advise.

## 2019-05-09 ENCOUNTER — Other Ambulatory Visit: Payer: Self-pay | Admitting: Obstetrics and Gynecology

## 2019-05-09 DIAGNOSIS — Z3041 Encounter for surveillance of contraceptive pills: Secondary | ICD-10-CM

## 2019-05-10 ENCOUNTER — Other Ambulatory Visit: Payer: Self-pay | Admitting: Physician Assistant

## 2019-05-10 DIAGNOSIS — F329 Major depressive disorder, single episode, unspecified: Secondary | ICD-10-CM

## 2019-05-10 DIAGNOSIS — F419 Anxiety disorder, unspecified: Secondary | ICD-10-CM

## 2019-05-10 NOTE — Progress Notes (Signed)
PCP:  Trey SailorsPollak, Adriana M, PA-C   Chief Complaint  Patient presents with  . Gynecologic Exam     HPI:      Ms. Misty Little is a 43 y.o. G2P1011 who LMP was Patient's last menstrual period was 04/09/2019 (exact date)., presents today for her annual examination.  Her menses are absent with continuous dosing of OCPs for menstrual migraines with and without aura. Menses last about 5 days during placebo wk, light flow, no dysmen. Menstrual migraines definitely improved with cont dosing. No migraines other than with menses.   Sex activity: single partner, contraception - OCP (estrogen/progesterone). Husband with hx of genital warts. Last Pap: 10/12/17  Results were: no abnormalities /neg HPV DNA . No hx of abn per pt report. Hx of STDs: none  Last mammogram: never.  There is no FH of breast cancer. There is no FH of ovarian cancer. The patient does do self-breast exams.  Tobacco use: The patient denies current or previous tobacco use. Alcohol use: none No drug use.  Exercise: mod active  She does get adequate calcium and Vitamin D in her diet. Labs with PCP.  Past Medical History:  Diagnosis Date  . Anemia   . Headache(784.0)   . Infection    uti  . Menstrual migraine   . Seizures (HCC)    "small ones as child"  . Sturge-Weber syndrome with glaucoma [facility](HCC)     Past Surgical History:  Procedure Laterality Date  . CESAREAN SECTION N/A 09/02/2014   Procedure: CESAREAN SECTION;  Surgeon: Catalina AntiguaPeggy Constant, MD;  Location: [facility];  Service: Obstetrics;  Laterality: N/A;  . EYE SURGERY  1977  . EYE SURGERY Right 2010   glaucoma  . TOE SURGERY  1977    Family History  Problem Relation Age of Onset  . Heart disease Mother   . Diabetes Maternal Grandmother   . Alzheimer's disease Maternal Grandmother   . Hypertension Father   . Healthy Son   . Alzheimer's disease Maternal Grandfather   . Parkinsonism Maternal Grandfather   . Healthy Sister   . Diabetes Maternal Aunt   .  Hearing loss Neg Hx     Social History   Socioeconomic History  . Marital status: Married    Spouse name: Not on file  . Number of children: Not on file  . Years of education: Not on file  . Highest education level: Not on file  Occupational History  . Not on file  Social Needs  . Financial resource strain: Not on file  . Food insecurity    Worry: Not on file    Inability: Not on file  . Transportation needs    Medical: Not on file    Non-medical: Not on file  Tobacco Use  . Smoking status: Never Smoker  . Smokeless tobacco: Never Used  Substance and Sexual Activity  . Alcohol use: Yes    Comment: socially  . Drug use: No  . Sexual activity: Yes    Birth control/protection: None  Lifestyle  . Physical activity    Days per week: Not on file    Minutes per session: Not on file  . Stress: Not on file  Relationships  . Social Musicianconnections    Talks on phone: Not on file    Gets together: Not on file    Attends religious service: Not on file    Active member of club or organization: Not on file    Attends meetings of clubs  or organizations: Not on file    Relationship status: Not on file  . Intimate partner violence    Fear of current or ex partner: Not on file    Emotionally abused: Not on file    Physically abused: Not on file    Forced sexual activity: Not on file  Other Topics Concern  . Not on file  Social History Narrative  . Not on file    Current Meds  Medication Sig  . albuterol (PROVENTIL HFA;VENTOLIN HFA) 108 (90 Base) MCG/ACT inhaler Inhale 2 puffs into the lungs every 6 (six) hours as needed for wheezing or shortness of breath.  . cholecalciferol (VITAMIN D) 1000 units tablet Take 1,000 Units by mouth daily.  . cyclobenzaprine (FLEXERIL) 5 MG tablet Take 1 tablet (5 mg total) by mouth 3 (three) times daily as needed (muscle pain).  Marland Kitchen escitalopram (LEXAPRO) 10 MG tablet TAKE 1 TABLET(10 MG) BY MOUTH AT BEDTIME  . fluticasone (FLONASE) 50 MCG/ACT nasal  spray Place 2 sprays into both nostrils daily.  . IRON PO Take by mouth.  Marland Kitchen ketorolac (TORADOL) 10 MG tablet Take 1 tablet (10 mg total) by mouth every 6 (six) hours as needed. For migraine headache  . loratadine (CLARITIN) 10 MG tablet Take by mouth.  . Magnesium 400 MG TABS Take by mouth.  . meclizine (ANTIVERT) 12.5 MG tablet Take 1 tablet (12.5 mg total) by mouth 3 (three) times daily as needed for dizziness.  . norgestimate-ethinyl estradiol (ESTARYLLA) 0.25-35 MG-MCG tablet Take 1 tablet by mouth daily. CONTINUOUS DOSING  . promethazine (PHENERGAN) 25 MG tablet Take by mouth. Take 1 tablet (25 mg total) by mouth as needed for Nausea  . SUMAtriptan (IMITREX) 50 MG tablet TAKE 1 TABLET BY MOUTH AS DIRECTED, MAY REPEAT IN 2 HOURS IF HEADACHE PERSISTS OR RECURS  . TURMERIC PO Take by mouth.  . vitamin B-12 (CYANOCOBALAMIN) 500 MCG tablet Take 500 mcg by mouth daily.  . [DISCONTINUED] ESTARYLLA 0.25-35 MG-MCG tablet TAKE 1 TABLET BY MOUTH DAILY     ROS:  Review of Systems  Constitutional: Negative for fatigue, fever and unexpected weight change.  Respiratory: Negative for cough, shortness of breath and wheezing.   Cardiovascular: Negative for chest pain, palpitations and leg swelling.  Gastrointestinal: Negative for blood in stool, constipation, diarrhea, nausea and vomiting.  Endocrine: Negative for cold intolerance, heat intolerance and polyuria.  Genitourinary:  Negative for dyspareunia, dysuria, flank pain, frequency, genital sores, hematuria, pelvic pain, urgency, vaginal discharge and vaginal pain.  Musculoskeletal: Negative for back pain, joint swelling and myalgias.  Skin: Negative for rash.  Neurological: Negative for dizziness, syncope, light-headedness, numbness and headaches.  Hematological: Negative for adenopathy.  Psychiatric/Behavioral: Negative for agitation, confusion, sleep disturbance and suicidal ideas. The patient is not nervous/anxious.      Objective: BP  120/80   Ht 5\' 2"  (1.575 m)   Wt 262 lb 6.4 oz (119 kg)   LMP 04/09/2019 (Exact Date)   BMI 47.99 kg/m    Physical Exam  Constitutional: She is oriented to person, place, and time. She appears well-developed and well-nourished.  Genitourinary: Vagina normal and uterus normal. There is no rash or tenderness on the right labia. There is no rash or tenderness on the left labia. No erythema or tenderness in the vagina. No vaginal discharge found. Right adnexum does not display mass and does not display tenderness. Left adnexum does not display mass and does not display tenderness. Cervix does not exhibit motion tenderness or polyp.  Uterus is not enlarged or tender.  Neck: Normal range of motion. No thyromegaly present.  Cardiovascular: Normal rate, regular rhythm and normal heart sounds.  No murmur heard. Pulmonary/Chest: Effort normal and breath sounds normal. Right breast exhibits no mass, no nipple discharge, no skin change and no tenderness. Left breast exhibits no mass, no nipple discharge, no skin change and no tenderness.  Abdominal: Soft. There is no tenderness. There is no guarding.  Musculoskeletal: Normal range of motion.  Neurological: She is alert and oriented to person, place, and time. No cranial nerve deficit.  Psychiatric: She has a normal mood and affect. Her behavior is normal.  Vitals reviewed.   Assessment/Plan:  1. Encounter for annual routine gynecological examination   2. Encounter for surveillance of contraceptive pills OCP RF cont dosing for menstrual migraines. - norgestimate-ethinyl estradiol (ESTARYLLA) 0.25-35 MG-MCG tablet; Take 1 tablet by mouth daily. CONTINUOUS DOSING  Dispense: 84 tablet; Refill: 4  3. Screening for breast cancer Pt to sched mammo  Meds ordered this encounter  Medications  . norgestimate-ethinyl estradiol (ESTARYLLA) 0.25-35 MG-MCG tablet    Sig: Take 1 tablet by mouth daily. CONTINUOUS DOSING    Dispense:  84 tablet    Refill:  4     Order Specific Question:   Supervising Provider    Answer:   Nadara MustardHARRIS, ROBERT P [[id number]]             GYN counsel mammography screening, use and side effects of OCP's, adequate intake of calcium and vitamin D, diet and exercise     F/U  1 yr annual  Ransom Nickson B. Callaway Hailes, PA-C 05/11/2019 9:08 PM

## 2019-05-11 ENCOUNTER — Other Ambulatory Visit: Payer: Self-pay | Admitting: Physician Assistant

## 2019-05-11 ENCOUNTER — Other Ambulatory Visit: Payer: Self-pay

## 2019-05-11 ENCOUNTER — Ambulatory Visit (INDEPENDENT_AMBULATORY_CARE_PROVIDER_SITE_OTHER): Payer: 59 | Admitting: Obstetrics and Gynecology

## 2019-05-11 ENCOUNTER — Encounter: Payer: Self-pay | Admitting: Obstetrics and Gynecology

## 2019-05-11 VITALS — BP 120/80 | Ht 62.0 in | Wt 262.4 lb

## 2019-05-11 DIAGNOSIS — Z1239 Encounter for other screening for malignant neoplasm of breast: Secondary | ICD-10-CM

## 2019-05-11 DIAGNOSIS — Z3041 Encounter for surveillance of contraceptive pills: Secondary | ICD-10-CM

## 2019-05-11 DIAGNOSIS — Z01419 Encounter for gynecological examination (general) (routine) without abnormal findings: Secondary | ICD-10-CM | POA: Diagnosis not present

## 2019-05-11 DIAGNOSIS — G43009 Migraine without aura, not intractable, without status migrainosus: Secondary | ICD-10-CM

## 2019-05-11 MED ORDER — NORGESTIMATE-ETH ESTRADIOL 0.25-35 MG-MCG PO TABS: 1.0000 | ORAL_TABLET | Freq: Every day | ORAL | 4 refills | Status: DC

## 2019-05-11 NOTE — Telephone Encounter (Signed)
[facility] faxed refill request for the following medications:  ketorolac (TORADOL) 10 MG tablet   Please advise.

## 2019-05-11 NOTE — Telephone Encounter (Signed)
Patient is requesting a refill on Cyclobenzaprine and Escitalopram.

## 2019-05-11 NOTE — Patient Instructions (Addendum)
I value your feedback and entrusting us with your care. If you get a [facility] patient survey, I would appreciate you taking the time to let us know about your experience today. Thank you!  [facility]: [phone]  [facility]: [phone]  

## 2019-05-12 MED ORDER — KETOROLAC TROMETHAMINE 10 MG PO TABS: 10.0000 mg | ORAL_TABLET | Freq: Four times a day (QID) | ORAL | 0 refills | Status: DC | PRN

## 2019-06-02 ENCOUNTER — Encounter: Payer: Self-pay | Admitting: Physician Assistant

## 2019-06-02 DIAGNOSIS — M549 Dorsalgia, unspecified: Secondary | ICD-10-CM

## 2019-06-05 MED ORDER — CYCLOBENZAPRINE HCL 5 MG PO TABS: 5.0000 mg | ORAL_TABLET | Freq: Three times a day (TID) | ORAL | 0 refills | Status: DC | PRN

## 2019-06-23 ENCOUNTER — Encounter: Payer: Self-pay | Admitting: Physician Assistant

## 2019-06-23 DIAGNOSIS — E669 Obesity, unspecified: Secondary | ICD-10-CM

## 2019-08-07 ENCOUNTER — Other Ambulatory Visit: Payer: Self-pay | Admitting: Physician Assistant

## 2019-08-07 DIAGNOSIS — F329 Major depressive disorder, single episode, unspecified: Secondary | ICD-10-CM

## 2019-08-07 DIAGNOSIS — F419 Anxiety disorder, unspecified: Secondary | ICD-10-CM

## 2019-08-18 ENCOUNTER — Encounter: Payer: Self-pay | Admitting: Physician Assistant

## 2019-10-04 MED ORDER — ENOXAPARIN SODIUM 30 MG/0.3ML ~~LOC~~ SOLN
30.00 | SUBCUTANEOUS | Status: DC
Start: 2019-10-03 — End: 2019-10-04

## 2019-10-04 MED ORDER — ALBUTEROL SULFATE HFA 108 (90 BASE) MCG/ACT IN AERS
2.00 | INHALATION_SPRAY | RESPIRATORY_TRACT | Status: DC
Start: ? — End: 2019-10-04

## 2019-10-04 MED ORDER — GABAPENTIN 300 MG PO CAPS
300.00 | ORAL_CAPSULE | ORAL | Status: DC
Start: 2019-10-04 — End: 2019-10-04

## 2019-10-04 MED ORDER — KETOROLAC TROMETHAMINE 15 MG/ML IJ SOLN
15.00 | INTRAMUSCULAR | Status: DC
Start: 2019-10-03 — End: 2019-10-04

## 2019-10-04 MED ORDER — ONDANSETRON HCL 4 MG/2ML IJ SOLN
4.00 | INTRAMUSCULAR | Status: DC
Start: ? — End: 2019-10-04

## 2019-10-04 MED ORDER — SCOPOLAMINE 1 MG/3DAYS TD PT72
1.00 | MEDICATED_PATCH | TRANSDERMAL | Status: DC
Start: 2019-10-05 — End: 2019-10-04

## 2019-10-04 MED ORDER — PANTOPRAZOLE SODIUM 40 MG IV SOLR
40.00 | INTRAVENOUS | Status: DC
Start: 2019-10-04 — End: 2019-10-04

## 2019-10-04 MED ORDER — LACTATED RINGERS IV SOLN
INTRAVENOUS | Status: DC
Start: ? — End: 2019-10-04

## 2019-10-04 MED ORDER — ONDANSETRON 4 MG PO TBDP
4.00 | ORAL_TABLET | ORAL | Status: DC
Start: ? — End: 2019-10-04

## 2019-10-04 MED ORDER — PROMETHAZINE HCL 25 MG/ML IJ SOLN
6.25 | INTRAMUSCULAR | Status: DC
Start: ? — End: 2019-10-04

## 2019-10-04 MED ORDER — ESCITALOPRAM OXALATE 10 MG PO TABS
10.00 | ORAL_TABLET | ORAL | Status: DC
Start: 2019-10-04 — End: 2019-10-04

## 2019-10-04 MED ORDER — TOPIRAMATE 25 MG PO TABS
50.00 | ORAL_TABLET | ORAL | Status: DC
Start: 2019-10-04 — End: 2019-10-04

## 2019-10-04 MED ORDER — OXYCODONE HCL 5 MG PO TABS
5.00 | ORAL_TABLET | ORAL | Status: DC
Start: ? — End: 2019-10-04

## 2019-10-04 MED ORDER — ACETAMINOPHEN 500 MG PO TABS
1000.00 | ORAL_TABLET | ORAL | Status: DC
Start: 2019-10-03 — End: 2019-10-04

## 2019-10-04 MED ORDER — SUMATRIPTAN SUCCINATE 50 MG PO TABS
50.00 | ORAL_TABLET | ORAL | Status: DC
Start: ? — End: 2019-10-04

## 2019-10-07 MED ORDER — HYDROMORPHONE HCL 1 MG/ML IJ SOLN
0.25 | INTRAMUSCULAR | Status: DC
Start: ? — End: 2019-10-07

## 2019-10-07 MED ORDER — PROMETHAZINE HCL 25 MG/ML IJ SOLN
12.50 | INTRAMUSCULAR | Status: DC
Start: ? — End: 2019-10-07

## 2019-10-07 MED ORDER — PANTOPRAZOLE SODIUM 40 MG IV SOLR
40.00 | INTRAVENOUS | Status: DC
Start: 2019-10-08 — End: 2019-10-07

## 2019-10-07 MED ORDER — ENOXAPARIN SODIUM 30 MG/0.3ML ~~LOC~~ SOLN
30.00 | SUBCUTANEOUS | Status: DC
Start: 2019-10-08 — End: 2019-10-07

## 2019-10-07 MED ORDER — ONDANSETRON HCL 4 MG/2ML IJ SOLN
4.00 | INTRAMUSCULAR | Status: DC
Start: 2019-10-07 — End: 2019-10-07

## 2019-10-07 MED ORDER — SCOPOLAMINE 1 MG/3DAYS TD PT72
1.00 | MEDICATED_PATCH | TRANSDERMAL | Status: DC
Start: 2019-10-08 — End: 2019-10-07

## 2019-10-07 MED ORDER — ALBUTEROL SULFATE HFA 108 (90 BASE) MCG/ACT IN AERS
2.00 | INHALATION_SPRAY | RESPIRATORY_TRACT | Status: DC
Start: ? — End: 2019-10-07

## 2019-10-24 DIAGNOSIS — Z9884 Bariatric surgery status: Secondary | ICD-10-CM | POA: Insufficient documentation

## 2019-11-04 ENCOUNTER — Other Ambulatory Visit: Payer: Self-pay | Admitting: Physician Assistant

## 2019-11-04 DIAGNOSIS — F32A Depression, unspecified: Secondary | ICD-10-CM

## 2019-11-04 DIAGNOSIS — F329 Major depressive disorder, single episode, unspecified: Secondary | ICD-10-CM

## 2019-11-04 NOTE — Telephone Encounter (Signed)
Requested medication (s) are due for refill today: yes  Requested medication (s) are on the active medication list: yes  Last refill:  08/09/2019  Future visit scheduled: no  Notes to clinic:  Overdue for office visit Review for refill   Requested Prescriptions  Pending Prescriptions Disp Refills   escitalopram (LEXAPRO) 10 MG tablet [Pharmacy Med Name: ESCITALOPRAM 10MG  TABLETS] 90 tablet 0    Sig: TAKE 1 TABLET(10 MG) BY MOUTH AT BEDTIME     Psychiatry:  Antidepressants - SSRI Failed - 11/04/2019  3:34 AM      Failed - Valid encounter within last 6 months    Recent Outpatient Visits          7 months ago Upper respiratory tract infection, unspecified type   [facility] [place], Fabio Bering M, PA-C   8 months ago Cough   [place], Wendee [place], [place]   1 year ago [place], [place], [place]   1 year ago Migraine without aura and without status migrainosus, not intractable   [place], [place]   1 year ago Adjustment disorder, unspecified type   [place], PA-C             Failed - Completed PHQ-2 or PHQ-9 in the last 360 days.

## 2019-12-01 HISTORY — PX: BARIATRIC SURGERY: SHX1103

## 2019-12-11 ENCOUNTER — Other Ambulatory Visit: Payer: Self-pay | Admitting: Physician Assistant

## 2019-12-11 DIAGNOSIS — G43009 Migraine without aura, not intractable, without status migrainosus: Secondary | ICD-10-CM

## 2019-12-11 NOTE — Telephone Encounter (Signed)
Requested Prescriptions  Pending Prescriptions Disp Refills  . SUMAtriptan (IMITREX) 50 MG tablet [Pharmacy Med Name: SUMATRIPTAN 50MG  TABLETS] 10 tablet 1    Sig: TAKE 1 TABLET BY MOUTH AS DIRECTED, MAY REPEAT IN 2 HOURS IF HEADACHE PERSISTS OR RECURS     Neurology:  Migraine Therapy - Triptan Passed - 12/11/2019 10:52 AM      Passed - Last BP in normal range    BP Readings from Last 1 Encounters:  05/11/19 120/80         Passed - Valid encounter within last 12 months    Recent Outpatient Visits          8 months ago Upper respiratory tract infection, unspecified type   [facility] [place], [place] M, M   9 months ago Cough   [facility] [place], [place], Lavella [place]   1 year ago Vertigo   [facility] [place], [place] M, M   1 year ago Migraine without aura and without status migrainosus, not intractable   [facility] [place], [place], [place]   1 year ago Adjustment disorder, unspecified type   [facility] [place], [place], [place]

## 2020-02-02 ENCOUNTER — Other Ambulatory Visit: Payer: Self-pay | Admitting: Physician Assistant

## 2020-02-02 DIAGNOSIS — F32A Depression, unspecified: Secondary | ICD-10-CM

## 2020-02-02 DIAGNOSIS — F329 Major depressive disorder, single episode, unspecified: Secondary | ICD-10-CM

## 2020-02-02 NOTE — Telephone Encounter (Signed)
Filled x 90 days. Please schedule for an office visit as this hasn't been assessed > 1 year. Can be virtual. Will need OV before next refill.

## 2020-02-02 NOTE — Telephone Encounter (Signed)
Requested medications are due for refill today? Yes  Requested medications are on active medication list?  Yes  Last Refill:   11/06/2019 # 90 with 0 refills   Future visit scheduled? NO   Notes to Clinic:  RX Refill protocol failed due to no valid encounter within last six months.  Last office visit was 10 months ago.

## 2020-02-02 NOTE — Telephone Encounter (Signed)
Called patient and no answer or voicemail setup. Will try patient at later time.

## 2020-02-02 NOTE — Telephone Encounter (Signed)
[name] was on 03/17/2019 and no upcoming appointment.

## 2020-02-07 NOTE — Telephone Encounter (Signed)
Tired patient again and she had a voicemail box but it was full and could not leave a message for patient to call back to schedule appointment.

## 2020-03-26 ENCOUNTER — Other Ambulatory Visit: Payer: Self-pay | Admitting: Obstetrics and Gynecology

## 2020-03-26 DIAGNOSIS — Z3041 Encounter for surveillance of contraceptive pills: Secondary | ICD-10-CM

## 2020-05-05 ENCOUNTER — Other Ambulatory Visit: Payer: Self-pay | Admitting: Physician Assistant

## 2020-05-05 DIAGNOSIS — F32A Depression, unspecified: Secondary | ICD-10-CM

## 2020-05-05 NOTE — Telephone Encounter (Signed)
Requested medication (s) are due for refill today: yes  Requested medication (s) are on the active medication list: yes  Last refill:  02/02/20  Future visit scheduled: no  Notes to clinic:  Pt overdue for OV. Called pt and LM on Vm to call office to schedule appt   Requested Prescriptions  Pending Prescriptions Disp Refills   escitalopram (LEXAPRO) 10 MG tablet [Pharmacy Med Name: ESCITALOPRAM 10MG  TABLETS] 90 tablet 0    Sig: TAKE 1 TABLET(10 MG) BY MOUTH AT BEDTIME      Psychiatry:  Antidepressants - SSRI Failed - 05/05/2020  8:35 AM      Failed - Valid encounter within last 6 months    Recent Outpatient Visits           1 year ago Upper respiratory tract infection, unspecified type   [facility] [place], [place], [place]   1 year ago Cough   [facility], Trey Sailors   1 year ago Vertigo   [facility] [place], [place] M, M   1 year ago Migraine without aura and without status migrainosus, not intractable   [facility] [place], [place], [place]   2 years ago Adjustment disorder, unspecified type   [facility] [place], [place], [place]

## 2020-05-06 NOTE — Telephone Encounter (Signed)
Patient [name] for anxiety was on 04/18/2018 and patient scheduled appointment for 05/09/2020 @ 9:40 AM.

## 2020-05-08 NOTE — Progress Notes (Signed)
MyChart Video Visit    Virtual Visit via Video Note   This visit type was conducted due to national recommendations for restrictions regarding the COVID-19 Pandemic (e.g. social distancing) in an effort to limit this patient's exposure and mitigate transmission in our community. This patient is at least at moderate risk for complications without adequate follow up. This format is felt to be most appropriate for this patient at this time. Physical exam was limited by quality of the video and audio technology used for the visit.   Patient location: Home Provider location: Office    Patient: Misty Little   DOB: 1976/07/03   44 y.o. Female  MRN: [id number] Visit Date: 05/09/2020  Today's healthcare provider: Trinna Post, PA-C   Chief Complaint  Patient presents with  . Anxiety  . Depression   Subjective    HPI   In the interim she has had gastric bypass surgery.   Height 5'Weight loss of 77 lbs and currently weight is 190 lbs. Goal weight is 150 lbs.   Anxiety, Follow-up  She was last seen for anxiety 2 years ago. Changes made at last visit include increased Lexapro from 10 mg to 20 mg. She is currently doing 10 mg of lexapro daily because she felt the 20 mg daily would be too much.    She reports good compliance with treatment. She reports good tolerance of treatment. She is not having side effects.   She feels her anxiety is mild and Improved since last visit.  Symptoms: No chest pain No difficulty concentrating  No dizziness No fatigue  No feelings of losing control No insomnia  No irritable No palpitations  No panic attacks No racing thoughts  No shortness of breath No sweating  No tremors/shakes    GAD-7 Results GAD-7 Generalized Anxiety Disorder Screening Tool 05/09/2020  1. Feeling Nervous, Anxious, or on Edge 0  2. Not Being Able to Stop or Control Worrying 0  3. Worrying Too Much About Different Things 0  4. Trouble Relaxing 1  5. Being So Restless  it's Hard To Sit Still 0  6. Becoming Easily Annoyed or Irritable 0  7. Feeling Afraid As If Something Awful Might Happen 0  Total GAD-7 Score 1  Difficulty At Work, Home, or Getting  Along With Others? Not difficult at all    PHQ-9 Scores PHQ9 SCORE ONLY 05/09/2020 03/14/2018 07/17/2014  PHQ-9 Total Score 2 17 0    --------------------------------------------------------------------------------------------------- Depression, Follow-up  She  was last seen for this 2 years ago. Changes made at last visit include increased Lexapro from 10 mg to 20 mg.   She reports good compliance with treatment. She is not having side effects.   She reports good tolerance of treatment. Current symptoms include: depressed mood She feels she is Improved since last visit.  Depression screen [facility] 2/9 05/09/2020 03/14/2018 07/17/2014  Decreased Interest 0 2 0  Down, Depressed, Hopeless 1 2 0  PHQ - 2 Score 1 4 0  Altered sleeping 1 3 -  Tired, decreased energy 0 3 -  Change in appetite 0 2 -  Feeling bad or failure about yourself  0 3 -  Trouble concentrating 0 0 -  Moving slowly or fidgety/restless 0 2 -  Suicidal thoughts - 0 -  PHQ-9 Score 2 17 -  Difficult doing work/chores Not difficult at all Somewhat difficult -     She is treated for migraines with imitrex 50 mg PRN.  She occasionally  has back spasms and uses Flexeril PRN.  -----------------------------------------------------------------------------------------     Medications: Outpatient Medications Prior to Visit  Medication Sig  . albuterol (PROVENTIL HFA;VENTOLIN HFA) 108 (90 Base) MCG/ACT inhaler Inhale 2 puffs into the lungs every 6 (six) hours as needed for wheezing or shortness of breath.  . cholecalciferol (VITAMIN D) 1000 units tablet Take 1,000 Units by mouth daily.  . cyclobenzaprine (FLEXERIL) 5 MG tablet Take 1 tablet (5 mg total) by mouth 3 (three) times daily as needed (muscle pain).  . fluticasone (FLONASE) 50  MCG/ACT nasal spray Place 2 sprays into both nostrils daily.  Marland Kitchen loratadine (CLARITIN) 10 MG tablet Take by mouth.  . meclizine (ANTIVERT) 12.5 MG tablet Take 1 tablet (12.5 mg total) by mouth 3 (three) times daily as needed for dizziness.  . norgestimate-ethinyl estradiol (ESTARYLLA) 0.25-35 MG-MCG tablet Take 1 tablet by mouth daily. CONTINUOUS DOSING  . promethazine (PHENERGAN) 25 MG tablet Take by mouth. Take 1 tablet (25 mg total) by mouth as needed for Nausea  . SUMAtriptan (IMITREX) 50 MG tablet TAKE 1 TABLET BY MOUTH AS DIRECTED, MAY REPEAT IN 2 HOURS IF HEADACHE PERSISTS OR RECURS  . [DISCONTINUED] escitalopram (LEXAPRO) 10 MG tablet TAKE 1 TABLET(10 MG) BY MOUTH AT BEDTIME  . [DISCONTINUED] IRON PO Take by mouth.  . [DISCONTINUED] Magnesium 400 MG TABS Take by mouth.  . [DISCONTINUED] topiramate (TOPAMAX) 50 MG tablet Take by mouth. Take 1 tablet (50 mg total) by mouth once daily Take half tablet for two weeks then increase to 1 whole tablet  . [DISCONTINUED] TURMERIC PO Take by mouth.  . [DISCONTINUED] vitamin B-12 (CYANOCOBALAMIN) 500 MCG tablet Take 500 mcg by mouth daily.   No facility-administered medications prior to visit.    Review of Systems  Constitutional: Negative.   Respiratory: Negative.   Cardiovascular: Negative.   Psychiatric/Behavioral: Negative.       Objective    There were no vitals taken for this visit.   Physical Exam Constitutional:      Appearance: Normal appearance.  Pulmonary:     Effort: Pulmonary effort is normal. No respiratory distress.  Neurological:     Mental Status: She is alert.  Psychiatric:        Mood and Affect: Mood normal.        Behavior: Behavior normal.        Assessment & Plan    1. Anxiety  Continue Lexapro 10 mg QD. Refilled x 1 year. Follow up 1 year  2. Other migraine without status migrainosus, not intractable  Continue imitrex PRN.   3. Anxiety and depression  - escitalopram (LEXAPRO) 10 MG tablet;  TAKE 1 TABLET(10 MG) BY MOUTH AT BEDTIME  Dispense: 90 tablet; Refill: 3  4. Low back pain, unspecified back pain laterality, unspecified chronicity, unspecified whether sciatica present  Continue Flexeril PRN    No follow-ups on file.     I discussed the assessment and treatment plan with the patient. The patient was provided an opportunity to ask questions and all were answered. The patient agreed with the plan and demonstrated an understanding of the instructions.   The patient was advised to call back or seek an in-person evaluation if the symptoms worsen or if the condition fails to improve as anticipated.   ITrey Sailors, PA-C, have reviewed all documentation for this visit. The documentation on 05/09/20 for the exam, diagnosis, procedures, and orders are all accurate and complete.   Trey Sailors, PA-C [facility]  [phone] (phone) [phone] (fax)  [facility]

## 2020-05-09 ENCOUNTER — Encounter: Payer: Self-pay | Admitting: Physician Assistant

## 2020-05-09 ENCOUNTER — Telehealth (INDEPENDENT_AMBULATORY_CARE_PROVIDER_SITE_OTHER): Payer: 59 | Admitting: Physician Assistant

## 2020-05-09 DIAGNOSIS — G43809 Other migraine, not intractable, without status migrainosus: Secondary | ICD-10-CM | POA: Diagnosis not present

## 2020-05-09 DIAGNOSIS — F419 Anxiety disorder, unspecified: Secondary | ICD-10-CM | POA: Diagnosis not present

## 2020-05-09 DIAGNOSIS — F329 Major depressive disorder, single episode, unspecified: Secondary | ICD-10-CM | POA: Diagnosis not present

## 2020-05-09 DIAGNOSIS — M545 Low back pain, unspecified: Secondary | ICD-10-CM

## 2020-05-09 DIAGNOSIS — F32A Depression, unspecified: Secondary | ICD-10-CM

## 2020-05-09 MED ORDER — ESCITALOPRAM OXALATE 10 MG PO TABS: ORAL_TABLET | ORAL | 3 refills | Status: DC

## 2020-05-19 ENCOUNTER — Other Ambulatory Visit: Payer: Self-pay | Admitting: Obstetrics and Gynecology

## 2020-05-19 DIAGNOSIS — Z3041 Encounter for surveillance of contraceptive pills: Secondary | ICD-10-CM

## 2020-05-29 ENCOUNTER — Other Ambulatory Visit: Payer: Self-pay | Admitting: Obstetrics and Gynecology

## 2020-05-29 DIAGNOSIS — Z3041 Encounter for surveillance of contraceptive pills: Secondary | ICD-10-CM

## 2020-06-04 ENCOUNTER — Other Ambulatory Visit: Payer: Self-pay | Admitting: Obstetrics and Gynecology

## 2020-06-04 ENCOUNTER — Telehealth: Payer: Self-pay | Admitting: Obstetrics and Gynecology

## 2020-06-04 DIAGNOSIS — Z3041 Encounter for surveillance of contraceptive pills: Secondary | ICD-10-CM

## 2020-06-04 MED ORDER — NORGESTIMATE-ETH ESTRADIOL 0.25-35 MG-MCG PO TABS: 1.0000 | ORAL_TABLET | Freq: Every day | ORAL | 0 refills | Status: DC

## 2020-06-04 NOTE — Telephone Encounter (Signed)
RF sent.

## 2020-06-04 NOTE — Telephone Encounter (Signed)
Patient needs refill on bc, has annual scheduled 8/4 with [facility] but will run out before that time.  She states her Rx is sent in 90 day supply with no placebo?  [facility] [place].

## 2020-06-27 NOTE — Progress Notes (Signed)
Established patient visit   Patient: Misty Little   DOB: September 21, 1976   44 y.o. Female  MRN: [id number] Visit Date: 06/28/2020  Today's healthcare provider: Trey Sailors, PA-C   Chief Complaint  Patient presents with  . Foot Pain  I,Misty Little,acting as a scribe for Trey Sailors, PA-C.,have documented all relevant documentation on the behalf of Trey Sailors, PA-C,as directed by  Trey Sailors, PA-C while in the presence of Trey Sailors, PA-C.  Subjective    HPI   Foot Pain Patient presents today for foot pain for several months. Patient reports that she wears a foot brace and it helps with the pain. She reports this will hurt the most in the morning and get better throughout the day. Hurts the most after inactivity. She has previously had plantar fasciitis and was treated by [facility] with cortisone injections. Does not wish to return.      Medications: Outpatient Medications Prior to Visit  Medication Sig  . albuterol (PROVENTIL HFA;VENTOLIN HFA) 108 (90 Base) MCG/ACT inhaler Inhale 2 puffs into the lungs every 6 (six) hours as needed for wheezing or shortness of breath.  . cholecalciferol (VITAMIN D) 1000 units tablet Take 1,000 Units by mouth daily.  . cyclobenzaprine (FLEXERIL) 5 MG tablet Take 1 tablet (5 mg total) by mouth 3 (three) times daily as needed (muscle pain).  Marland Kitchen escitalopram (LEXAPRO) 10 MG tablet TAKE 1 TABLET(10 MG) BY MOUTH AT BEDTIME  . fluticasone (FLONASE) 50 MCG/ACT nasal spray Place 2 sprays into both nostrils daily.  Marland Kitchen loratadine (CLARITIN) 10 MG tablet Take by mouth.  . meclizine (ANTIVERT) 12.5 MG tablet Take 1 tablet (12.5 mg total) by mouth 3 (three) times daily as needed for dizziness.  . norgestimate-ethinyl estradiol (ESTARYLLA) 0.25-35 MG-MCG tablet Take 1 tablet by mouth daily. CONTINUOUS DOSING  . promethazine (PHENERGAN) 25 MG tablet Take by mouth. Take 1 tablet (25 mg total) by mouth as needed for Nausea  .  SUMAtriptan (IMITREX) 50 MG tablet TAKE 1 TABLET BY MOUTH AS DIRECTED, MAY REPEAT IN 2 HOURS IF HEADACHE PERSISTS OR RECURS   No facility-administered medications prior to visit.    Review of Systems  Constitutional: Negative.   Respiratory: Negative.   Cardiovascular: Negative.   Hematological: Negative.       Objective    BP 124/82 (BP Location: Left Arm, Patient Position: Sitting, Cuff Size: Normal)   Pulse 67   Temp 98.5 F (36.9 C) (Oral)   Ht 5\' 1"  (1.549 m)   Wt 188 lb 14.4 oz (85.7 kg)   SpO2 99%   BMI 35.69 kg/m    Physical Exam Constitutional:      Appearance: Normal appearance.  Musculoskeletal:     Right foot: Normal range of motion. No deformity.     Left foot: Normal range of motion. No deformity.  Feet:     Right foot:     Skin integrity: Skin integrity normal.     Left foot:     Skin integrity: Skin integrity normal.  Skin:    General: Skin is warm and dry.  Neurological:     Mental Status: She is alert.       No results found for any visits on 06/28/20.  Assessment & Plan    1. Left foot pain  Cannot have Nsaids due to bariatric surgery. Trial of prednisone as below. Suspect plantar fasciitis, refer to podiatry.   - predniSONE (DELTASONE) 10  MG tablet; Take taper dose:take 6 on day 1, 5 on day 2, 4 on day 3, etc...  Dispense: 21 tablet; Refill: 0 - Ambulatory referral to Podiatry  2. Back pain, unspecified back location, unspecified back pain laterality, unspecified chronicity  - cyclobenzaprine (FLEXERIL) 5 MG tablet; Take 1 tablet (5 mg total) by mouth 3 (three) times daily as needed (muscle pain).  Dispense: 30 tablet; Refill: 0    Return if symptoms worsen or fail to improve.      ITrey Sailors, PA-C, have reviewed all documentation for this visit. The documentation on 06/28/20 for the exam, diagnosis, procedures, and orders are all accurate and complete.    Maryella Shivers  [facility] [phone]  (phone) [phone] (fax)  [facility]

## 2020-06-28 ENCOUNTER — Ambulatory Visit (INDEPENDENT_AMBULATORY_CARE_PROVIDER_SITE_OTHER): Payer: 59 | Admitting: Physician Assistant

## 2020-06-28 ENCOUNTER — Other Ambulatory Visit: Payer: Self-pay

## 2020-06-28 ENCOUNTER — Encounter: Payer: Self-pay | Admitting: Physician Assistant

## 2020-06-28 VITALS — BP 124/82 | HR 67 | Temp 98.5°F | Ht 61.0 in | Wt 188.9 lb

## 2020-06-28 DIAGNOSIS — M79672 Pain in left foot: Secondary | ICD-10-CM

## 2020-06-28 DIAGNOSIS — M549 Dorsalgia, unspecified: Secondary | ICD-10-CM

## 2020-06-28 MED ORDER — PREDNISONE 10 MG PO TABS: ORAL_TABLET | ORAL | 0 refills | Status: DC

## 2020-06-28 MED ORDER — CYCLOBENZAPRINE HCL 5 MG PO TABS: 5.0000 mg | ORAL_TABLET | Freq: Three times a day (TID) | ORAL | 0 refills | Status: DC | PRN

## 2020-06-28 NOTE — Patient Instructions (Signed)

## 2020-07-03 ENCOUNTER — Ambulatory Visit (INDEPENDENT_AMBULATORY_CARE_PROVIDER_SITE_OTHER): Payer: 59 | Admitting: Obstetrics and Gynecology

## 2020-07-03 ENCOUNTER — Encounter: Payer: Self-pay | Admitting: Obstetrics and Gynecology

## 2020-07-03 ENCOUNTER — Other Ambulatory Visit (HOSPITAL_COMMUNITY)
Admission: RE | Admit: 2020-07-03 | Discharge: 2020-07-03 | Disposition: A | Discharge status: Home / Self Care | Payer: 59 | Source: Ambulatory Visit | Attending: Obstetrics and Gynecology | Admitting: Obstetrics and Gynecology

## 2020-07-03 ENCOUNTER — Other Ambulatory Visit: Payer: Self-pay

## 2020-07-03 VITALS — BP 110/80 | Ht 61.0 in | Wt 189.0 lb

## 2020-07-03 DIAGNOSIS — Z124 Encounter for screening for malignant neoplasm of cervix: Secondary | ICD-10-CM | POA: Insufficient documentation

## 2020-07-03 DIAGNOSIS — Z01419 Encounter for gynecological examination (general) (routine) without abnormal findings: Secondary | ICD-10-CM

## 2020-07-03 DIAGNOSIS — Z3041 Encounter for surveillance of contraceptive pills: Secondary | ICD-10-CM

## 2020-07-03 DIAGNOSIS — Z1151 Encounter for screening for human papillomavirus (HPV): Secondary | ICD-10-CM | POA: Diagnosis present

## 2020-07-03 DIAGNOSIS — Z1231 Encounter for screening mammogram for malignant neoplasm of breast: Secondary | ICD-10-CM | POA: Diagnosis not present

## 2020-07-03 MED ORDER — NORGESTIMATE-ETH ESTRADIOL 0.25-35 MG-MCG PO TABS: 1.0000 | ORAL_TABLET | Freq: Every day | ORAL | 3 refills | Status: DC

## 2020-07-03 NOTE — Progress Notes (Signed)
PCP:  Trey Sailors, PA-C   Chief Complaint  Patient presents with  . Gynecologic Exam     HPI:      Ms. Misty Little is a 44 y.o. G2P1011 who LMP was No LMP recorded. (Menstrual status: Oral contraceptives)., presents today for her annual examination.  Her menses are absent with continuous dosing of OCPs for menstrual migraines with and without aura. Menses last about 5 days during placebo wk, light flow, no dysmen. Menstrual migraines definitely improved with cont dosing. No migraines other than with menses.   Sex activity: single partner, contraception - OCP (estrogen/progesterone). Husband with hx of genital warts. Last Pap: 10/12/17  Results were: no abnormalities /neg HPV DNA . No hx of abn per pt report. Hx of STDs: none  Last mammogram: never.  There is no FH of breast cancer. There is no FH of ovarian cancer. The patient does do self-breast exams.  Tobacco use: The patient denies current or previous tobacco use. Alcohol use: none No drug use.  Exercise: mod active  She does get adequate calcium and Vitamin D in her diet. Labs with PCP. S/p bariatric surgery with 85# wt loss.   Past Medical History:  Diagnosis Date  . Anemia   . Headache(784.0)   . Infection    uti  . Menstrual migraine   . Seizures (HCC)    "small ones as child"  . 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 BARIATRIC SURGERY  2021  . CESAREAN SECTION N/A 09/02/2014   Procedure: CESAREAN SECTION;  Surgeon: Catalina Antigua, MD;  Location: [facility];  Service: Obstetrics;  Laterality: N/A;  . EYE SURGERY  1977  . EYE SURGERY Right 2010   glaucoma  . TOE SURGERY  1977    Family History  Problem Relation Age of Onset  . Heart disease Mother   . Diabetes Maternal Grandmother   . Alzheimer's disease Maternal Grandmother   . Hypertension Father   . Healthy Son   . Alzheimer's disease Maternal Grandfather   . Parkinsonism Maternal  Grandfather   . Healthy Sister   . Diabetes Maternal Aunt   . Hearing loss Neg Hx     Social History   Socioeconomic History  . Marital status: Married    Spouse name: Not on file  . Number of children: Not on file  . Years of education: Not on file  . Highest education level: Not on file  Occupational History  . Not on file  Tobacco Use  . Smoking status: Never Smoker  . Smokeless tobacco: Never Used  Vaping Use  . Vaping Use: Never used  Substance and Sexual Activity  . Alcohol use: Yes    Comment: socially  . Drug use: No  . Sexual activity: Yes    Birth control/protection: None, Pill  Other Topics Concern  . Not on file  Social History Narrative  . Not on file   Social Determinants of Health   Financial Resource Strain:   . Difficulty of Paying Living Expenses:   Food Insecurity:   . Worried About Programme researcher, broadcasting/film/video in the Last Year:   . Barista in the Last Year:   Transportation Needs:   . Freight forwarder (Medical):   Marland Kitchen Lack of Transportation (Non-Medical):   Physical Activity:   . Days of Exercise per Week:   . Minutes of Exercise per Session:  Stress:   . Feeling of Stress :   Social Connections:   . Frequency of Communication with Friends and Family:   . Frequency of Social Gatherings with Friends and Family:   . Attends Religious Services:   . Active Member of Clubs or Organizations:   . Attends Banker Meetings:   Marland Kitchen Marital Status:   Intimate Partner Violence:   . Fear of Current or Ex-Partner:   . Emotionally Abused:   Marland Kitchen Physically Abused:   . Sexually Abused:     Current Meds  Medication Sig  . albuterol (PROVENTIL HFA;VENTOLIN HFA) 108 (90 Base) MCG/ACT inhaler Inhale 2 puffs into the lungs every 6 (six) hours as needed for wheezing or shortness of breath.  . cyclobenzaprine (FLEXERIL) 5 MG tablet Take 1 tablet (5 mg total) by mouth 3 (three) times daily as needed (muscle pain).  Marland Kitchen escitalopram (LEXAPRO) 10 MG  tablet TAKE 1 TABLET(10 MG) BY MOUTH AT BEDTIME  . fluticasone (FLONASE) 50 MCG/ACT nasal spray Place 2 sprays into both nostrils daily.  Marland Kitchen loratadine (CLARITIN) 10 MG tablet Take by mouth.  . meclizine (ANTIVERT) 12.5 MG tablet Take 1 tablet (12.5 mg total) by mouth 3 (three) times daily as needed for dizziness.  . norgestimate-ethinyl estradiol (ESTARYLLA) 0.25-35 MG-MCG tablet Take 1 tablet by mouth daily. CONTINUOUS DOSING  . predniSONE (DELTASONE) 10 MG tablet Take taper dose:take 6 on day 1, 5 on day 2, 4 on day 3, etc...  . promethazine (PHENERGAN) 25 MG tablet Take by mouth. Take 1 tablet (25 mg total) by mouth as needed for Nausea  . SUMAtriptan (IMITREX) 50 MG tablet TAKE 1 TABLET BY MOUTH AS DIRECTED, MAY REPEAT IN 2 HOURS IF HEADACHE PERSISTS OR RECURS  . [DISCONTINUED] norgestimate-ethinyl estradiol (ESTARYLLA) 0.25-35 MG-MCG tablet Take 1 tablet by mouth daily. CONTINUOUS DOSING     ROS:  Review of Systems  Constitutional: Negative for fatigue, fever and unexpected weight change.  Respiratory: Negative for cough, shortness of breath and wheezing.   Cardiovascular: Negative for chest pain, palpitations and leg swelling.  Gastrointestinal: Negative for blood in stool, constipation, diarrhea, nausea and vomiting.  Endocrine: Negative for cold intolerance, heat intolerance and polyuria.  Genitourinary: Negative for dyspareunia, dysuria, flank pain, frequency, genital sores, hematuria, menstrual problem, pelvic pain, urgency, vaginal bleeding, vaginal discharge and vaginal pain.  Musculoskeletal: Negative for back pain, joint swelling and myalgias.  Skin: Negative for rash.  Neurological: Positive for dizziness. Negative for syncope, light-headedness, numbness and headaches.  Hematological: Negative for adenopathy.  Psychiatric/Behavioral: Positive for agitation. Negative for confusion, sleep disturbance and suicidal ideas. The patient is not nervous/anxious.    Objective: BP  110/80   Ht 5\' 1"  (1.549 m)   Wt 189 lb (85.7 kg)   BMI 35.71 kg/m    Physical Exam   Physical Exam Constitutional:      Appearance: She is well-developed.  Genitourinary:     Vulva, vagina, cervix, uterus, right adnexa and left adnexa normal.     No vulval lesion or tenderness noted.     No vaginal discharge, erythema or tenderness.     No cervical polyp.     Uterus is not enlarged or tender.     No right or left adnexal mass present.     Right adnexa not tender.     Left adnexa not tender.  Neck:     Thyroid: No thyromegaly.  Cardiovascular:     Rate and Rhythm: Normal rate and regular rhythm.  Heart sounds: Normal heart sounds. No murmur heard.   Pulmonary:     Effort: Pulmonary effort is normal.     Breath sounds: Normal breath sounds.  Chest:     Breasts:        Right: No mass, nipple discharge, skin change or tenderness.        Left: No mass, nipple discharge, skin change or tenderness.  Abdominal:     Palpations: Abdomen is soft.     Tenderness: There is no abdominal tenderness. There is no guarding.  Musculoskeletal:        General: Normal range of motion.     Cervical back: Normal range of motion.  Neurological:     General: No focal deficit present.     Mental Status: She is alert and oriented to person, place, and time.     Cranial Nerves: No cranial nerve deficit.  Skin:    General: Skin is warm and dry.  Psychiatric:        Mood and Affect: Mood normal.        Behavior: Behavior normal.        Thought Content: Thought content normal.        Judgment: Judgment normal.  Vitals reviewed.     Assessment/Plan: Encounter for annual routine gynecological examination  Cervical cancer screening - Plan: Cytology - PAP  Screening for HPV (human papillomavirus) - Plan: Cytology - PAP  Encounter for screening mammogram for malignant neoplasm of breast - Plan: MM 3D SCREEN BREAST BILATERAL; pt to sched mammo  Encounter for surveillance of  contraceptive pills - Plan: norgestimate-ethinyl estradiol (ESTARYLLA) 0.25-35 MG-MCG tablet; OCP RF cont dosing.   Meds ordered this encounter  Medications  . norgestimate-ethinyl estradiol (ESTARYLLA) 0.25-35 MG-MCG tablet    Sig: Take 1 tablet by mouth daily. CONTINUOUS DOSING    Dispense:  112 tablet    Refill:  3    Order Specific Question:   Supervising Provider    Answer:   Nadara Mustard [[id number]]             GYN counsel mammography screening, use and side effects of OCP's, adequate intake of calcium and vitamin D, diet and exercise     F/U Return in about 1 year (around 07/03/2021).   Brennyn Ortlieb B. Tykesha Konicki, PA-C 07/03/2020 11:20 AM

## 2020-07-03 NOTE — Patient Instructions (Signed)
I value your feedback and entrusting us with your care. If you get a [facility] patient survey, I would appreciate you taking the time to let us know about your experience today. Thank you!  As of November 09, 2019, your lab results will be released to your MyChart immediately, before I even have a chance to see them. Please give me time to review them and contact you if there are any abnormalities. Thank you for your patience.   [facility]: [phone]  [facility]: [phone]  

## 2020-07-04 LAB — CYTOLOGY - PAP
Comment: NEGATIVE
Diagnosis: NEGATIVE
High risk HPV: NEGATIVE

## 2020-07-31 ENCOUNTER — Other Ambulatory Visit: Payer: Self-pay | Admitting: Physician Assistant

## 2020-07-31 DIAGNOSIS — G43009 Migraine without aura, not intractable, without status migrainosus: Secondary | ICD-10-CM

## 2020-10-05 ENCOUNTER — Other Ambulatory Visit: Payer: Self-pay | Admitting: Physician Assistant

## 2020-10-05 DIAGNOSIS — F32A Depression, unspecified: Secondary | ICD-10-CM

## 2020-10-08 ENCOUNTER — Other Ambulatory Visit: Payer: Self-pay

## 2020-10-08 ENCOUNTER — Other Ambulatory Visit: Payer: Self-pay | Admitting: Physician Assistant

## 2020-10-08 ENCOUNTER — Ambulatory Visit (INDEPENDENT_AMBULATORY_CARE_PROVIDER_SITE_OTHER): Payer: 59 | Admitting: Physician Assistant

## 2020-10-08 ENCOUNTER — Encounter: Payer: Self-pay | Admitting: Physician Assistant

## 2020-10-08 VITALS — BP 109/71 | HR 80 | Temp 97.7°F | Wt 174.1 lb

## 2020-10-08 DIAGNOSIS — G43809 Other migraine, not intractable, without status migrainosus: Secondary | ICD-10-CM | POA: Diagnosis not present

## 2020-10-08 DIAGNOSIS — Q8589 Other phakomatoses, not elsewhere classified: Secondary | ICD-10-CM

## 2020-10-08 DIAGNOSIS — H9202 Otalgia, left ear: Secondary | ICD-10-CM | POA: Diagnosis not present

## 2020-10-08 DIAGNOSIS — Q858 Other phakomatoses, not elsewhere classified: Secondary | ICD-10-CM

## 2020-10-08 DIAGNOSIS — F32A Depression, unspecified: Secondary | ICD-10-CM

## 2020-10-08 MED ORDER — SUMATRIPTAN SUCCINATE 100 MG PO TABS: ORAL_TABLET | ORAL | 0 refills | Status: DC

## 2020-10-08 MED ORDER — TOPIRAMATE 50 MG PO TABS: ORAL_TABLET | ORAL | 0 refills | Status: DC

## 2020-10-08 NOTE — Telephone Encounter (Signed)
Medication Refill - Medication: escitalopram (LEXAPRO) 10 MG tablet   Has the patient contacted their pharmacy? yes (Agent: If no, request that the patient contact the pharmacy for the refill.) (Agent: If yes, when and what did the pharmacy advise?)Contact PCP  Preferred Pharmacy (with phone number or street name):  [id number] [place], [place] - [address] AT [facility] OF [address] [facility] Phone:  [phone]  Fax:  [phone]       Agent: Please be advised that RX refills may take up to 3 business days. We ask that you follow-up with your pharmacy.

## 2020-10-08 NOTE — Progress Notes (Signed)
Established patient visit   Patient: Misty Little   DOB: 03-31-1976   44 y.o. Female  MRN: [id number] Visit Date: 10/08/2020  Today's healthcare provider: Trey Sailors, PA-C   Chief Complaint  Patient presents with  . Headache  . Ear Fullness  I,Lyndon Chapel M Daija Routson,acting as a scribe for Trey Sailors, PA-C.,have documented all relevant documentation on the behalf of Trey Sailors, PA-C,as directed by  Trey Sailors, PA-C while in the presence of Trey Sailors, PA-C.  Subjective    Ear Fullness  There is pain in the left ear. This is a recurrent problem. The current episode started in the past 7 days. The problem has been unchanged. There has been no fever. The pain is at a severity of 0/10. The patient is experiencing no pain. Associated symptoms include headaches. Pertinent negatives include no coughing, diarrhea, ear discharge, hearing loss, rhinorrhea or sore throat. She has tried ear drops for the symptoms. The treatment provided no relief.    Follow up for migraine  The patient was last seen for this 1 years ago. Changes made at last visit include taking sumatriptan PRN for migraine. She is having more migraines lately. These have typically been related to menstruation but now are becoming more frequent. She reports having migraines for a week at a time. The imitrex works inconsistently when she has migraines. She has been on topamax previously in 2019 but cannot remember if it worked.   She reports good compliance with treatment. She feels that condition is Unchanged. Patient reports she has to take tylenol along with her migraine medicine. She is not having side effects.   -----------------------------------------------------------------------------------------  She reports a muffled sensation in her left ear. She thinks she may have some hearing loss.      BP Readings from Last 5 Encounters:  10/08/20 109/71  07/03/20 110/80  06/28/20 124/82  05/11/19  120/80  03/17/19 137/87    Medications: Outpatient Medications Prior to Visit  Medication Sig  . albuterol (PROVENTIL HFA;VENTOLIN HFA) 108 (90 Base) MCG/ACT inhaler Inhale 2 puffs into the lungs every 6 (six) hours as needed for wheezing or shortness of breath.  . cyclobenzaprine (FLEXERIL) 5 MG tablet Take 1 tablet (5 mg total) by mouth 3 (three) times daily as needed (muscle pain).  Marland Kitchen escitalopram (LEXAPRO) 10 MG tablet TAKE 1 TABLET(10 MG) BY MOUTH AT BEDTIME  . fluticasone (FLONASE) 50 MCG/ACT nasal spray Place 2 sprays into both nostrils daily.  Marland Kitchen loratadine (CLARITIN) 10 MG tablet Take by mouth.  . meclizine (ANTIVERT) 12.5 MG tablet Take 1 tablet (12.5 mg total) by mouth 3 (three) times daily as needed for dizziness.  . norgestimate-ethinyl estradiol (ESTARYLLA) 0.25-35 MG-MCG tablet Take 1 tablet by mouth daily. CONTINUOUS DOSING  . promethazine (PHENERGAN) 25 MG tablet Take by mouth. Take 1 tablet (25 mg total) by mouth as needed for Nausea  . [DISCONTINUED] SUMAtriptan (IMITREX) 50 MG tablet TAKE 1 TABLET BY MOUTH AS DIRECTED, MAY REPEAT IN 2 HOURS IF HEADACHE PERSISTS OR RECURS  . [DISCONTINUED] predniSONE (DELTASONE) 10 MG tablet Take taper dose:take 6 on day 1, 5 on day 2, 4 on day 3, etc...   No facility-administered medications prior to visit.    Review of Systems  HENT: Negative for ear discharge, hearing loss, rhinorrhea and sore throat.   Respiratory: Negative for cough.   Gastrointestinal: Negative for diarrhea.  Neurological: Positive for headaches.       Objective  BP 109/71 (BP Location: Left Arm, Patient Position: Sitting, Cuff Size: Large)   Pulse 80   Temp 97.7 F (36.5 C) (Oral)   Wt 174 lb 1.6 oz (79 kg)   LMP 09/29/2020 (Exact Date)   SpO2 99%   BMI 32.90 kg/m     Physical Exam Constitutional:      Appearance: Normal appearance. She is normal weight.  HENT:     Right Ear: Tympanic membrane and ear canal normal.     Left Ear: Tympanic  membrane and ear canal normal.  Cardiovascular:     Rate and Rhythm: Normal rate and regular rhythm.  Pulmonary:     Effort: Pulmonary effort is normal.     Breath sounds: Normal breath sounds.  Skin:    General: Skin is warm and dry.  Neurological:     General: No focal deficit present.     Mental Status: She is alert and oriented to person, place, and time.  Psychiatric:        Mood and Affect: Mood normal.        Behavior: Behavior normal.       No results found for any visits on 10/08/20.  Assessment & Plan    1. Sturge-Weber syndrome (HCC)   2. Other migraine without status migrainosus, not intractable  Start topamax as below.   - topiramate (TOPAMAX) 50 MG tablet; Take one tablet nightly for one week. Then take two tablets nightly onward.  Dispense: 180 tablet; Refill: 0 - SUMAtriptan (IMITREX) 100 MG tablet; May repeat in 2 hours if headache persists or recurs.  Dispense: 10 tablet; Refill: 0  3. Left ear pain  - Ambulatory referral to ENT   No follow-ups on file.      ITrey Sailors, PA-C, have reviewed all documentation for this visit. The documentation on 10/08/20 for the exam, diagnosis, procedures, and orders are all accurate and complete.  The entirety of the information documented in the History of Present Illness, Review of Systems and Physical Exam were personally obtained by me. Portions of this information were initially documented by [facility] and reviewed by me for thoroughness and accuracy.     Maryella Shivers  [facility] [phone] (phone) [phone] (fax)  [facility]

## 2020-11-04 ENCOUNTER — Telehealth: Payer: Self-pay | Admitting: Physician Assistant

## 2020-11-04 NOTE — Telephone Encounter (Signed)
Called to get a referral for a bariatric specialist.  Please call patient to discuss at [phone]

## 2020-11-05 NOTE — Telephone Encounter (Signed)
Patient is returning a call she said regarding a referral.  She stated she already got the bariatric specialist but needed to see an ENT.  Please call patient to discuss at [phone]

## 2020-11-05 NOTE — Telephone Encounter (Signed)
The referral was placed on 10/08/2020 for ENT. They have contacted her on multiple occasions, have her check her voicemail. Also can provide with number for [facility] and she may call them to schedule.

## 2020-11-05 NOTE — Telephone Encounter (Signed)
She already has a bariatric specialist. Does she need a referral to the same one?

## 2020-11-05 NOTE — Telephone Encounter (Signed)
Called patient and no answer,LVMTRC if patient calls back okay for P[facility] to advise of message. °

## 2020-11-05 NOTE — Telephone Encounter (Signed)
Called patient and no answer and mailbox was too full to leave a message. If patient calls back okay for PEC to advise.

## 2020-11-11 NOTE — Telephone Encounter (Signed)
Called patient and no answer,LVMTRC if patient calls back okay for PEC to advise of message.

## 2020-11-11 NOTE — Telephone Encounter (Signed)
Send patient a message via mychart advising her to that the ENT doctor has tried to reach her.

## 2020-12-09 ENCOUNTER — Ambulatory Visit (INDEPENDENT_AMBULATORY_CARE_PROVIDER_SITE_OTHER): Payer: 59 | Admitting: Physician Assistant

## 2020-12-09 ENCOUNTER — Other Ambulatory Visit: Payer: Self-pay

## 2020-12-09 ENCOUNTER — Encounter: Payer: Self-pay | Admitting: Physician Assistant

## 2020-12-09 VITALS — BP 113/55 | HR 67 | Temp 98.5°F | Wt 170.4 lb

## 2020-12-09 DIAGNOSIS — Q8589 Other phakomatoses, not elsewhere classified: Secondary | ICD-10-CM

## 2020-12-09 DIAGNOSIS — Q858 Other phakomatoses, not elsewhere classified: Secondary | ICD-10-CM | POA: Diagnosis not present

## 2020-12-09 DIAGNOSIS — F32A Depression, unspecified: Secondary | ICD-10-CM | POA: Insufficient documentation

## 2020-12-09 MED ORDER — BUSPIRONE HCL 7.5 MG PO TABS: 7.5000 mg | ORAL_TABLET | Freq: Two times a day (BID) | ORAL | 0 refills | Status: DC

## 2020-12-09 NOTE — Progress Notes (Signed)
Established patient visit   Patient: Misty Little   DOB: Sep 08, 1976   45 y.o. Female  MRN: [id number] Visit Date: 12/09/2020  Today's healthcare provider: Trey Sailors, PA-C   Chief Complaint  Patient presents with  . Migraine  I,Keidra Withers M Ross Hefferan,acting as a scribe for Trey Sailors, PA-C.,have documented all relevant documentation on the behalf of Trey Sailors, PA-C,as directed by  Trey Sailors, PA-C while in the presence of Trey Sailors, PA-C.  Subjective    HPI  Follow up for migraine   The patient was last seen for this 2 months ago. Changes made at last visit include started topiramate 50 MG tablet; Take one tablet nightly for one week. Then take two tablets nightly onward. She is currently having migraines surrounding menstrual cycles currently. Otherwise she is migraine free.   She reports good compliance with treatment. She feels that condition is Improved. Still have migraines with menstrual cycles. She is not having side effects.   Wt Readings from Last 3 Encounters:  12/09/20 170 lb 6.4 oz (77.3 kg)  10/08/20 174 lb 1.6 oz (79 kg)  07/03/20 189 lb (85.7 kg)    Depression, Follow-up  She  was last seen for this 6 months ago. Changes made at last visit include continue Lexapro 10 mg.   She reports good compliance with treatment. She is not having side effects.   She reports good tolerance of treatment. Current symptoms include: anhedonia and depressed mood She feels she is Worse since last visit.  Depression screen [facility] 2/9 12/09/2020 10/08/2020 05/09/2020  Decreased Interest 3 0 0  Down, Depressed, Hopeless 2 1 1   PHQ - 2 Score 5 1 1   Altered sleeping 0 0 1  Tired, decreased energy 1 1 0  Change in appetite 0 0 0  Feeling bad or failure about yourself  0 0 0  Trouble concentrating 0 0 0  Moving slowly or fidgety/restless 0 0 0  Suicidal thoughts 0 0 -  PHQ-9 Score 6 2 2   Difficult doing work/chores Not difficult at all Not difficult  at all Not difficult at all    -----------------------------------------------------------------------------------------  -----------------------------------------------------------------------------------------       Medications: Outpatient Medications Prior to Visit  Medication Sig  . albuterol (PROVENTIL HFA;VENTOLIN HFA) 108 (90 Base) MCG/ACT inhaler Inhale 2 puffs into the lungs every 6 (six) hours as needed for wheezing or shortness of breath.  . cyclobenzaprine (FLEXERIL) 5 MG tablet Take 1 tablet (5 mg total) by mouth 3 (three) times daily as needed (muscle pain).  escitalopram (LEXAPRO) 10 MG tablet TAKE 1 TABLET(10 MG) BY MOUTH AT BEDTIME  . fluticasone (FLONASE) 50 MCG/ACT nasal spray Place 2 sprays into both nostrils daily.  loratadine (CLARITIN) 10 MG tablet Take by mouth.  . meclizine (ANTIVERT) 12.5 MG tablet Take 1 tablet (12.5 mg total) by mouth 3 (three) times daily as needed for dizziness.  . norgestimate-ethinyl estradiol (ESTARYLLA) 0.25-35 MG-MCG tablet Take 1 tablet by mouth daily. CONTINUOUS DOSING  . promethazine (PHENERGAN) 25 MG tablet Take by mouth. Take 1 tablet (25 mg total) by mouth as needed for Nausea  . SUMAtriptan (IMITREX) 100 MG tablet May repeat in 2 hours if headache persists or recurs.  . topiramate (TOPAMAX) 50 MG tablet Take one tablet nightly for one week. Then take two tablets nightly onward.   No facility-administered medications prior to visit.    Review of Systems  Constitutional: Negative.  Cardiovascular: Negative.   Neurological: Negative.       Objective    BP (!) 113/55 (BP Location: Left Arm, Patient Position: Sitting, Cuff Size: Large)   Pulse 67   Temp 98.5 F (36.9 C) (Oral)   Wt 170 lb 6.4 oz (77.3 kg)   LMP 11/23/2020 (Exact Date)   SpO2 99%   BMI 32.20 kg/m    Physical Exam Constitutional:      Appearance: Normal appearance.  Cardiovascular:     Rate and Rhythm: Normal rate.  Pulmonary:     Effort:  Pulmonary effort is normal.  Skin:    General: Skin is warm and dry.  Neurological:     General: No focal deficit present.     Mental Status: She is alert and oriented to person, place, and time.  Psychiatric:        Mood and Affect: Mood normal.        Behavior: Behavior normal.       No results found for any visits on 12/09/20.  Assessment & Plan    1. Sturge-Weber syndrome (HCC)   2. Depression, unspecified depression type  Worsening depression, add buspar, she can decide if she wants to take this in addition to her lexapro. She has previously been on lexapro 20 mg and it was not effective.   - Ambulatory referral to Psychology - busPIRone (BUSPAR) 7.5 MG tablet; Take 1 tablet (7.5 mg total) by mouth 2 (two) times daily.  Dispense: 180 tablet; Refill: 0   No follow-ups on file.      ITrey Sailors, PA-C, have reviewed all documentation for this visit. The documentation on 12/09/20 for the exam, diagnosis, procedures, and orders are all accurate and complete.  The entirety of the information documented in the History of Present Illness, Review of Systems and Physical Exam were personally obtained by me. Portions of this information were initially documented by [facility] and reviewed by me for thoroughness and accuracy.     Maryella Shivers  [facility] [phone] (phone) [phone] (fax)  [facility]

## 2020-12-09 NOTE — Patient Instructions (Signed)
https://www.nimh.nih.gov/health/publications/depression-what-you-need-to-know/index.shtml">  Depression Screening Depression screening is a tool that your health care provider can use to learn if you have symptoms of depression. Depression is a common condition with many symptoms that are also often found in other conditions. Depression is treatable, but it must first be diagnosed. You may not know that certain feelings, thoughts, and behaviors that you are having can be symptoms of depression. Taking a depression screening test can help you and your health care provider decide if you need more assessment, or if you should be referred to a mental health care provider. What are the screening tests?  You may have a physical exam to see if another condition is affecting your mental health. You may have a blood or urine sample taken during the physical exam.  You may be interviewed using a screening tool that was developed from research, such as one of these: ? Patient Health Questionnaire (PHQ). This is a set of either 2 or 9 questions. A health care provider who has been trained to score this screening test uses a guide to assess if your symptoms suggest that you may have depression. ? Hamilton Depression Rating Scale (HAM-D). This is a set of either 17 or 24 questions. You may be asked to take it again during or after your treatment, to see if your depression has gotten better. ? Beck Depression Inventory (BDI). This is a set of 21 multiple choice questions. Your health care provider scores your answers to assess:  Your level of depression, ranging from mild to severe.  Your response to treatment.  Your health care provider may talk with you about your daily activities, such as eating, sleeping, work, and recreation, and ask if you have had any changes in activity.  Your health care provider may ask you to see a mental health specialist, such as a psychiatrist or psychologist, for more  evaluation. Who should be screened for depression?  All adults, including adults with a family history of a mental health disorder.  Adolescents who are 12-18 years old.  People who are recovering from a myocardial infarction (MI).  Pregnant women, or women who have given birth.  People who have a long-term (chronic) illness.  Anyone who has been diagnosed with another type of a mental health disorder.  Anyone who has symptoms that could show depression.   What do my results mean? Your health care provider will review the results of your depression screening, physical exam, and lab tests. Positive screens suggest that you may have depression. Screening is the first step in getting the care that you may need. It is up to you to get your screening results. Ask your health care provider, or the department that is doing your screening tests, when your results will be ready. Talk with your health care provider about your results and diagnosis. A diagnosis of depression is made using the Diagnostic and Statistical Manual of Mental Disorders (DSM-V). This is a book that lists the number and type of symptoms that must be present for a health care provider to give a specific diagnosis.  Your health care provider may work with you to treat your symptoms of depression, or your health care provider may help you find a mental health provider who can assess, diagnose, and treat your depression. Get help right away if:  You have thoughts about hurting yourself or others. If you ever feel like you may hurt yourself or others, or have thoughts about taking your own life, get help   right away. You can go to your nearest emergency department or call:  Your local emergency services (911 in the [place]  A suicide crisis helpline, such as the National Suicide Prevention Lifeline at 1-800-273-8255. This is open 24 hours a day. Summary  Depression screening is the first step in getting the help that you may  need.  If your screening test shows symptoms of depression (is positive), your health care provider may ask you to see a mental health provider.  Anyone who is age 12 or older should be screened for depression. This information is not intended to replace advice given to you by your health care provider. Make sure you discuss any questions you have with your health care provider. Document Revised: 05/09/2020 Document Reviewed: 05/09/2020 Elsevier Patient Education  2021 Elsevier Inc.  

## 2020-12-10 ENCOUNTER — Ambulatory Visit: Payer: Self-pay | Admitting: Physician Assistant

## 2021-01-07 ENCOUNTER — Ambulatory Visit (INDEPENDENT_AMBULATORY_CARE_PROVIDER_SITE_OTHER): Payer: 59 | Admitting: Psychology

## 2021-01-07 DIAGNOSIS — F330 Major depressive disorder, recurrent, mild: Secondary | ICD-10-CM | POA: Diagnosis not present

## 2021-01-20 ENCOUNTER — Ambulatory Visit (INDEPENDENT_AMBULATORY_CARE_PROVIDER_SITE_OTHER): Payer: 59 | Admitting: Psychology

## 2021-01-20 ENCOUNTER — Other Ambulatory Visit: Payer: Self-pay | Admitting: Physician Assistant

## 2021-01-20 DIAGNOSIS — G43809 Other migraine, not intractable, without status migrainosus: Secondary | ICD-10-CM

## 2021-01-20 DIAGNOSIS — F330 Major depressive disorder, recurrent, mild: Secondary | ICD-10-CM | POA: Diagnosis not present

## 2021-02-05 ENCOUNTER — Telehealth: Payer: Self-pay

## 2021-02-05 NOTE — Telephone Encounter (Signed)
Copied from CRM [id number]. Topic: Quick Communication - See Telephone Encounter >> Feb 05, 2021  3:04 PM Aretta Nip wrote: CRM for notification. See Telephone encounter for: 02/05/21 [phone] Pt has called in and states slammed door on her fingers yesterday and they are purple. Pt states due to Bariatric Surgery can not take certain meds. She states she works at [employer] and was told she could take gabapentin. She has taken this in the past. She denied appt as has to work and was offered virtual. Pt just wanted a cb to nurse to see if it was at all possible to call in a small dose to get her thruu the next couple days pls return call at [phone]/

## 2021-02-05 NOTE — Telephone Encounter (Signed)
Sorry, we would have to evaluate. Can do virtual.

## 2021-02-06 NOTE — Telephone Encounter (Signed)
Patient was advised and states that she wouldn't be able to do a virtual visit due to having to go to work in half an hour. Also she was offered a virtual for tomorrow,02/07/2021 and she declined visit. She said stated thanks anyway and hung the phone up.

## 2021-02-07 ENCOUNTER — Ambulatory Visit (INDEPENDENT_AMBULATORY_CARE_PROVIDER_SITE_OTHER): Payer: 59 | Admitting: Psychology

## 2021-02-07 DIAGNOSIS — F330 Major depressive disorder, recurrent, mild: Secondary | ICD-10-CM

## 2021-02-21 ENCOUNTER — Ambulatory Visit: Payer: 59 | Admitting: Psychology

## 2021-03-05 ENCOUNTER — Other Ambulatory Visit: Payer: Self-pay | Admitting: Physician Assistant

## 2021-03-05 DIAGNOSIS — F32A Depression, unspecified: Secondary | ICD-10-CM

## 2021-03-14 ENCOUNTER — Other Ambulatory Visit: Payer: Self-pay | Admitting: Physician Assistant

## 2021-03-14 DIAGNOSIS — G43809 Other migraine, not intractable, without status migrainosus: Secondary | ICD-10-CM

## 2021-03-14 NOTE — Telephone Encounter (Signed)
Requested medication (s) are due for refill today: no  Requested medication (s) are on the active medication list yes  Last refill: 01/05/2021  Future visit scheduled: yes  Notes to clinic: this refill cannot be delegated    Requested Prescriptions  Pending Prescriptions Disp Refills   topiramate (TOPAMAX) 50 MG tablet [Pharmacy Med Name: TOPIRAMATE 50MG  TABLETS] 180 tablet 0    Sig: TAKE 1 TABLET BY MOUTH NIGHTLY FOR 1 WEEK, THEN TAKE 2 TABLETS NIGHTLY ONWARD      Not Delegated - Neurology: Anticonvulsants - topiramate & zonisamide Failed - 03/14/2021  8:09 AM      Failed - This refill cannot be delegated      Failed - Cr in normal range and within 360 days    Creatinine, Ser  Date Value Ref Range Status  10/24/2018 0.78 0.44 - 1.00 mg/dL Final   Creatinine, Urine  Date Value Ref Range Status  09/07/2014 51.66 mg/dL Final          Failed - CO2 in normal range and within 360 days    CO2  Date Value Ref Range Status  10/24/2018 26 22 - 32 mmol/L Final          Passed - Valid encounter within last 12 months    Recent Outpatient Visits           3 months ago Depression, unspecified depression type   [facility] [place], Adriana M, PA-C   5 months ago Other migraine without status migrainosus, not intractable   [facility] [place], [place], [place]   8 months ago Left foot pain   [facility] M, M   10 months ago Anxiety   [facility] [place], [place], [place]   1 year ago Upper respiratory tract infection, unspecified type   [facility] [place], [place], PA-C       Future Appointments             In 1 month Lavella Hammock, Jodi Marble, PA-C Lavella Hammock, [facility]

## 2021-03-27 ENCOUNTER — Other Ambulatory Visit: Payer: Self-pay | Admitting: Family Medicine

## 2021-03-27 DIAGNOSIS — G43809 Other migraine, not intractable, without status migrainosus: Secondary | ICD-10-CM

## 2021-03-27 MED ORDER — SUMATRIPTAN SUCCINATE 100 MG PO TABS: ORAL_TABLET | ORAL | 0 refills | Status: DC

## 2021-03-27 NOTE — Telephone Encounter (Signed)
[facility] faxed refill request for the following medications:  SUMAtriptan (IMITREX) 100 MG tablet  Please advise. Thanks TNP

## 2021-05-09 ENCOUNTER — Encounter: Payer: Self-pay | Admitting: Physician Assistant

## 2021-06-03 ENCOUNTER — Other Ambulatory Visit: Payer: Self-pay | Admitting: Student

## 2021-06-03 ENCOUNTER — Other Ambulatory Visit: Payer: Self-pay

## 2021-06-03 ENCOUNTER — Other Ambulatory Visit: Payer: Self-pay | Admitting: Family Medicine

## 2021-06-03 ENCOUNTER — Other Ambulatory Visit (HOSPITAL_COMMUNITY): Payer: Self-pay | Admitting: Student

## 2021-06-03 DIAGNOSIS — G43809 Other migraine, not intractable, without status migrainosus: Secondary | ICD-10-CM

## 2021-06-03 DIAGNOSIS — M549 Dorsalgia, unspecified: Secondary | ICD-10-CM

## 2021-06-03 DIAGNOSIS — R4189 Other symptoms and signs involving cognitive functions and awareness: Secondary | ICD-10-CM

## 2021-06-03 DIAGNOSIS — F32A Depression, unspecified: Secondary | ICD-10-CM

## 2021-06-03 MED ORDER — BUSPIRONE HCL 7.5 MG PO TABS: 7.5000 mg | ORAL_TABLET | Freq: Two times a day (BID) | ORAL | 0 refills | Status: DC

## 2021-06-03 MED ORDER — CYCLOBENZAPRINE HCL 5 MG PO TABS: 5.0000 mg | ORAL_TABLET | Freq: Three times a day (TID) | ORAL | 0 refills | Status: DC | PRN

## 2021-06-03 NOTE — Telephone Encounter (Signed)
[facility] faxed refill request for the following medications:  cyclobenzaprine (FLEXERIL) 5 MG tablet    Please advise.  

## 2021-06-03 NOTE — Telephone Encounter (Signed)
Requested medication (s) are due for refill today - yes  Requested medication (s) are on the active medication list -yes  Future visit scheduled -no  Last refill: 03/14/21  Notes to clinic: Request Rf- non delegated Rx  Requested Prescriptions  Pending Prescriptions Disp Refills   topiramate (TOPAMAX) 50 MG tablet [Pharmacy Med Name: TOPIRAMATE 50MG  TABLETS] 180 tablet 0    Sig: TAKE 1 TABLET BY MOUTH NIGHTLY FOR 1 WEEK, THEN TAKE 2 TABLETS NIGHTLY ONWARD      Not Delegated - Neurology: Anticonvulsants - topiramate & zonisamide Failed - 06/03/2021  9:38 AM      Failed - This refill cannot be delegated      Failed - Cr in normal range and within 360 days    Creatinine, Ser  Date Value Ref Range Status  10/24/2018 0.78 0.44 - 1.00 mg/dL Final   Creatinine, Urine  Date Value Ref Range Status  09/07/2014 51.66 mg/dL Final          Failed - CO2 in normal range and within 360 days    CO2  Date Value Ref Range Status  10/24/2018 26 22 - 32 mmol/L Final          Passed - Valid encounter within last 12 months    Recent Outpatient Visits           5 months ago Depression, unspecified depression type   [facility] [place], Adriana M, PA-C   7 months ago Other migraine without status migrainosus, not intractable   [facility] M, M   11 months ago Left foot pain   [facility] M, M   1 year ago Anxiety   [facility] M, M   2 years ago Upper respiratory tract infection, unspecified type   [facility] [place], Adriana M, M                    Requested Prescriptions  Pending Prescriptions Disp Refills   topiramate (TOPAMAX) 50 MG tablet [Pharmacy Med Name: TOPIRAMATE 50MG  TABLETS] 180 tablet 0    Sig: TAKE 1 TABLET BY MOUTH NIGHTLY FOR 1 WEEK, THEN TAKE 2 TABLETS NIGHTLY ONWARD      Not Delegated - Neurology: Anticonvulsants - topiramate  & zonisamide Failed - 06/03/2021  9:38 AM      Failed - This refill cannot be delegated      Failed - Cr in normal range and within 360 days    Creatinine, Ser  Date Value Ref Range Status  10/24/2018 0.78 0.44 - 1.00 mg/dL Final   Creatinine, Urine  Date Value Ref Range Status  09/07/2014 51.66 mg/dL Final          Failed - CO2 in normal range and within 360 days    CO2  Date Value Ref Range Status  10/24/2018 26 22 - 32 mmol/L Final          Passed - Valid encounter within last 12 months    Recent Outpatient Visits           5 months ago Depression, unspecified depression type   [facility] [place], Adriana M, PA-C   7 months ago Other migraine without status migrainosus, not intractable   [facility] [birth year]-[facility] M, [place]   11 months ago Left foot pain   [facility] [place], [facility], [place]   1 year ago Anxiety   [facility]  Trey Sailors, PA-C   2 years ago Upper respiratory tract infection, unspecified type   [facility] [place], [place], [place]

## 2021-06-03 NOTE — Telephone Encounter (Signed)
[facility] faxed refill request for the following medications:  1. busPIRone (BUSPAR) 7.5 MG tablet  2. topiramate (TOPAMAX) 50 MG tablet  LOV: 12/09/20 Please advise. Thanks TNP

## 2021-06-03 NOTE — Telephone Encounter (Signed)
Requested medication (s) are due for refill today: no  Requested medication (s) are on the active medication list: yes  Last refill:  06/03/2021  Future visit scheduled: no  Notes to clinic: last appt was canceled  Hasn't seen new provider yet    Requested Prescriptions  Pending Prescriptions Disp Refills   SUMAtriptan (IMITREX) 100 MG tablet [Pharmacy Med Name: SUMATRIPTAN 100MG  TABLETS] 10 tablet 0    Sig: TAKE 1 TABLET BY MOUTH AS DIRECTED. MAY REPEAT IN 2 HOURS IF HEADACHE PERSISTS OR RECURS      Neurology:  Migraine Therapy - Triptan Passed - 06/03/2021  2:18 PM      Passed - Last BP in normal range    BP Readings from Last 1 Encounters:  12/09/20 (!) 113/55          Passed - Valid encounter within last 12 months    Recent Outpatient Visits           5 months ago Depression, unspecified depression type   [facility] [place], [place], PA-C   7 months ago Other migraine without status migrainosus, not intractable   [facility] M, M   11 months ago Left foot pain   [facility] [place], [place], Lavella Hammock   1 year ago Anxiety   [facility] M, M   2 years ago Upper respiratory tract infection, unspecified type   [facility] [place], [place], [place]

## 2021-06-03 NOTE — Addendum Note (Signed)
Addended by: Kavin Leech E on: 06/03/2021 10:28 AM   Modules accepted: Orders

## 2021-06-03 NOTE — Telephone Encounter (Signed)
Patient called and said that she also needs   busPIRone (BUSPAR) 7.5 MG tablet for her migraine. She said it was denied but is not sure why. She would like to speak with someone regarding this or refill meds and send to her pharmacy.

## 2021-06-06 ENCOUNTER — Ambulatory Visit
Admission: RE | Admit: 2021-06-06 | Discharge: 2021-06-06 | Disposition: A | Discharge status: Home / Self Care | Payer: 59 | Source: Ambulatory Visit | Attending: Student | Admitting: Student

## 2021-06-06 ENCOUNTER — Other Ambulatory Visit: Payer: Self-pay

## 2021-06-06 DIAGNOSIS — R4189 Other symptoms and signs involving cognitive functions and awareness: Secondary | ICD-10-CM | POA: Diagnosis not present

## 2021-06-18 ENCOUNTER — Other Ambulatory Visit: Payer: Self-pay | Admitting: Family Medicine

## 2021-06-18 DIAGNOSIS — F32A Depression, unspecified: Secondary | ICD-10-CM

## 2021-06-18 DIAGNOSIS — F419 Anxiety disorder, unspecified: Secondary | ICD-10-CM

## 2021-06-18 MED ORDER — ESCITALOPRAM OXALATE 10 MG PO TABS: ORAL_TABLET | ORAL | 1 refills | Status: DC

## 2021-06-18 NOTE — Telephone Encounter (Signed)
  Notes to clinic:  Pt wants to see about getting medication refill until appt on 09/19/2021   Requested Prescriptions  Pending Prescriptions Disp Refills   escitalopram (LEXAPRO) 10 MG tablet 90 tablet 3    Sig: TAKE 1 TABLET(10 MG) BY MOUTH AT BEDTIME      Psychiatry:  Antidepressants - SSRI Failed - 06/18/2021 11:29 AM      Failed - Valid encounter within last 6 months    Recent Outpatient Visits           6 months ago Depression, unspecified depression type   [facility] [place], [place] M, PA-C   8 months ago Other migraine without status migrainosus, not intractable   [facility] Osvaldo Angst M, [place]   11 months ago Left foot pain   [facility] [place], [place]ella [place], [place]   1 year ago Anxiety   [facility] Osvaldo Angst M, [place]   2 years ago Upper respiratory tract infection, unspecified type   [facility] [place], [place], PA-C       Future Appointments             In 3 months Fisher, Demetrios Isaacs, MD [facility], [facility]             Passed - Completed PHQ-2 or PHQ-9 in the last 360 days

## 2021-06-18 NOTE — Telephone Encounter (Signed)
Medication Refill - Medication: escitalopram (LEXAPRO) 10 MG tablet   Has the patient contacted their pharmacy? No. *Pt wants to see about getting medication refill until appt on 10/21  Preferred Pharmacy (with phone number or street name):  [id number] [place] D[place], [place] - [address] AT [facility] [address] [facility]  Phone:  [phone] Fax:  [phone]  Agent: Please be advised that RX refills may take up to 3 business days. We ask that you follow-up with your pharmacy.

## 2021-08-17 ENCOUNTER — Other Ambulatory Visit: Payer: Self-pay | Admitting: Obstetrics and Gynecology

## 2021-08-17 DIAGNOSIS — Z3041 Encounter for surveillance of contraceptive pills: Secondary | ICD-10-CM

## 2021-09-03 ENCOUNTER — Other Ambulatory Visit: Payer: Self-pay | Admitting: Obstetrics and Gynecology

## 2021-09-03 DIAGNOSIS — Z3041 Encounter for surveillance of contraceptive pills: Secondary | ICD-10-CM

## 2021-09-04 ENCOUNTER — Telehealth: Payer: Self-pay | Admitting: Family Medicine

## 2021-09-04 NOTE — Telephone Encounter (Signed)
Pt is scheduled to see provider on 10/21, pt says that she only has a few birth control pills left. Pt would like to know if provider could send in a refill? Pt says that her insurance will only cover 90 day supplies. norgestimate-ethinyl estradiol (ESTARYLLA) 0.25-35 MG-MCG tablet    Pharmacy:   [facility] #[id number] Cheree Ditto, [place] - [address] AT [facility] [address] [facility] Phone:  [phone]  Fax:  [phone]      Please assist pt further.

## 2021-09-05 ENCOUNTER — Telehealth: Payer: Self-pay

## 2021-09-05 DIAGNOSIS — Z3041 Encounter for surveillance of contraceptive pills: Secondary | ICD-10-CM

## 2021-09-05 MED ORDER — NORGESTIMATE-ETH ESTRADIOL 0.25-35 MG-MCG PO TABS: 1.0000 | ORAL_TABLET | Freq: Every day | ORAL | 0 refills | Status: DC

## 2021-09-05 NOTE — Telephone Encounter (Signed)
Copied from CRM [id number]. Topic: General - Other >> Sep 05, 2021 12:42 PM Misty Little wrote: Reason for CRM: The patient has called for an update on their norgestimate-ethinyl estradiol (ESTARYLLA) 0.25-35 MG-MCG tablet [[id number]]  prescription  The patient shares that 09/07/21 will be their last day of taking the medication   The patient has been scheduled for 09/19/21 but would like to continue taking their birth control regularly until then   Please contact further when possible

## 2021-09-09 NOTE — Telephone Encounter (Signed)
Medication filed 09/05/21 by Merita Norton. KW

## 2021-09-19 ENCOUNTER — Ambulatory Visit: Payer: 59 | Admitting: Family Medicine

## 2021-09-19 ENCOUNTER — Encounter: Payer: Self-pay | Admitting: Family Medicine

## 2021-09-19 ENCOUNTER — Other Ambulatory Visit: Payer: Self-pay

## 2021-09-19 ENCOUNTER — Ambulatory Visit (INDEPENDENT_AMBULATORY_CARE_PROVIDER_SITE_OTHER): Payer: 59 | Admitting: Family Medicine

## 2021-09-19 VITALS — BP 110/70 | HR 71 | Temp 97.3°F | Resp 16 | Ht 61.0 in | Wt 170.7 lb

## 2021-09-19 DIAGNOSIS — G43829 Menstrual migraine, not intractable, without status migrainosus: Secondary | ICD-10-CM | POA: Insufficient documentation

## 2021-09-19 DIAGNOSIS — J45990 Exercise induced bronchospasm: Secondary | ICD-10-CM | POA: Insufficient documentation

## 2021-09-19 DIAGNOSIS — M549 Dorsalgia, unspecified: Secondary | ICD-10-CM | POA: Insufficient documentation

## 2021-09-19 DIAGNOSIS — F32A Depression, unspecified: Secondary | ICD-10-CM | POA: Diagnosis not present

## 2021-09-19 DIAGNOSIS — F419 Anxiety disorder, unspecified: Secondary | ICD-10-CM

## 2021-09-19 DIAGNOSIS — E6609 Other obesity due to excess calories: Secondary | ICD-10-CM

## 2021-09-19 DIAGNOSIS — Z3041 Encounter for surveillance of contraceptive pills: Secondary | ICD-10-CM | POA: Insufficient documentation

## 2021-09-19 DIAGNOSIS — Z6830 Body mass index (BMI) 30.0-30.9, adult: Secondary | ICD-10-CM

## 2021-09-19 DIAGNOSIS — J40 Bronchitis, not specified as acute or chronic: Secondary | ICD-10-CM | POA: Insufficient documentation

## 2021-09-19 MED ORDER — NORETHINDRONE 0.35 MG PO TABS: 1.0000 | ORAL_TABLET | Freq: Every day | ORAL | 4 refills | Status: DC

## 2021-09-19 MED ORDER — ESCITALOPRAM OXALATE 10 MG PO TABS: ORAL_TABLET | ORAL | 3 refills | Status: DC

## 2021-09-19 MED ORDER — BUSPIRONE HCL 7.5 MG PO TABS: 7.5000 mg | ORAL_TABLET | Freq: Two times a day (BID) | ORAL | 3 refills | Status: DC

## 2021-09-19 MED ORDER — ALBUTEROL SULFATE HFA 108 (90 BASE) MCG/ACT IN AERS: 2.0000 | INHALATION_SPRAY | Freq: Four times a day (QID) | RESPIRATORY_TRACT | 2 refills | Status: DC | PRN

## 2021-09-19 MED ORDER — CYCLOBENZAPRINE HCL 5 MG PO TABS: 5.0000 mg | ORAL_TABLET | Freq: Three times a day (TID) | ORAL | 3 refills | Status: AC | PRN

## 2021-09-19 MED ORDER — NURTEC 75 MG PO TBDP: 1.0000 | ORAL_TABLET | Freq: Every day | ORAL | 0 refills | Status: DC | PRN

## 2021-09-19 MED ORDER — SLYND 4 MG PO TABS: 1.0000 | ORAL_TABLET | Freq: Every day | ORAL | 0 refills | Status: DC

## 2021-09-19 NOTE — Progress Notes (Signed)
Established patient visit   Patient: Misty Little   DOB: 21-Jun-1976   45 y.o. Female  MRN: [id number] Visit Date: 09/19/2021  Today's healthcare provider: Jacky Kindle, FNP   Chief Complaint  Patient presents with   Depression   Contraception    Patient presents today to discuss changing medication for birth control, patient states that she has been experiencing migraine headaches and when she spoke with pharmacist she suggested to patient to discuss with PCP about starting Slynd. Patient reports that she takes Imitrex for headaches but states that she feels that it is not helping, she would like to discuss starting Gabapentin.   Subjective    HPI HPI     Contraception    Additional comments: Patient presents today to discuss changing medication for birth control, patient states that she has been experiencing migraine headaches and when she spoke with pharmacist she suggested to patient to discuss with PCP about starting Slynd. Patient reports that she takes Imitrex for headaches but states that she feels that it is not helping, she would like to discuss starting Gabapentin.      Last edited by Fonda Kinder, CMA on 09/19/2021 11:11 AM.      Depression, Follow-up  She  was last seen for this 9 months ago. Changes made at last visit include continue Lexapro add Buspar 7.5mg  qd.   She reports excellent compliance with treatment. She is not having side effects.   She reports excellent tolerance of treatment. Current symptoms include:  none She feels she is Unchanged since last visit.  Depression screen [facility] 2/9 12/09/2020 10/08/2020 05/09/2020  Decreased Interest 3 0 0  Down, Depressed, Hopeless 2 1 1   PHQ - 2 Score 5 1 1   Altered sleeping 0 0 1  Tired, decreased energy 1 1 0  Change in appetite 0 0 0  Feeling bad or failure about yourself  0 0 0  Trouble concentrating 0 0 0  Moving slowly or fidgety/restless 0 0 0  Suicidal thoughts 0 0 -  PHQ-9 Score 6 2 2    Difficult doing work/chores Not difficult at all Not difficult at all Not difficult at all    -----------------------------------------------------------------------------------------   Medications: Outpatient Medications Prior to Visit  Medication Sig   fluticasone (FLONASE) 50 MCG/ACT nasal spray Place 2 sprays into both nostrils daily.   loratadine (CLARITIN) 10 MG tablet Take by mouth.   SUMAtriptan (IMITREX) 100 MG tablet TAKE 1 TABLET BY MOUTH AS DIRECTED, MAY REPEAT IN 2 HOURS IF HEADACHE PERSISTS OR RECURS   topiramate (TOPAMAX) 50 MG tablet TAKE 1 TABLET BY MOUTH NIGHTLY FOR 1 WEEK, THEN TAKE 2 TABLETS NIGHTLY ONWARD   [DISCONTINUED] albuterol (PROVENTIL HFA;VENTOLIN HFA) 108 (90 Base) MCG/ACT inhaler Inhale 2 puffs into the lungs every 6 (six) hours as needed for wheezing or shortness of breath.   [DISCONTINUED] busPIRone (BUSPAR) 7.5 MG tablet Take 1 tablet (7.5 mg total) by mouth 2 (two) times daily.   [DISCONTINUED] cyclobenzaprine (FLEXERIL) 5 MG tablet Take 1 tablet (5 mg total) by mouth 3 (three) times daily as needed (muscle pain).   [DISCONTINUED] escitalopram (LEXAPRO) 10 MG tablet TAKE 1 TABLET(10 MG) BY MOUTH AT BEDTIME   [DISCONTINUED] meclizine (ANTIVERT) 12.5 MG tablet Take 1 tablet (12.5 mg total) by mouth 3 (three) times daily as needed for dizziness.   [DISCONTINUED] norgestimate-ethinyl estradiol (ESTARYLLA) 0.25-35 MG-MCG tablet Take 1 tablet by mouth daily. CONTINUOUS DOSING   [DISCONTINUED] promethazine (PHENERGAN) 25  MG tablet Take by mouth. Take 1 tablet (25 mg total) by mouth as needed for Nausea   No facility-administered medications prior to visit.    Review of Systems     Objective    BP 110/70   Pulse 71   Temp (!) 97.3 F (36.3 C) (Oral)   Resp 16   Ht 5\' 1"  (1.549 m)   Wt 170 lb 11.2 oz (77.4 kg)   BMI 32.25 kg/m  {Show previous vital signs (optional):23777}  Physical Exam Vitals and nursing note reviewed.  Constitutional:       General: She is not in acute distress.    Appearance: Normal appearance. She is obese. She is not ill-appearing, toxic-appearing or diaphoretic.  HENT:     Head: Normocephalic and atraumatic.  Cardiovascular:     Rate and Rhythm: Normal rate and regular rhythm.     Pulses: Normal pulses.     Heart sounds: Normal heart sounds. No murmur heard.   No friction rub. No gallop.  Pulmonary:     Effort: Pulmonary effort is normal. No respiratory distress.     Breath sounds: Normal breath sounds. No stridor. No wheezing, rhonchi or rales.  Chest:     Chest Little: No tenderness.  Abdominal:     General: Bowel sounds are normal.     Palpations: Abdomen is soft.  Musculoskeletal:        General: No swelling, tenderness, deformity or signs of injury. Normal range of motion.     Right lower leg: No edema.     Left lower leg: No edema.  Skin:    General: Skin is warm and dry.     Capillary Refill: Capillary refill takes less than 2 seconds.     Coloration: Skin is not jaundiced or pale.     Findings: No bruising, erythema, lesion or rash.  Neurological:     General: No focal deficit present.     Mental Status: She is alert and oriented to person, place, and time. Mental status is at baseline.     Cranial Nerves: No cranial nerve deficit.     Sensory: No sensory deficit.     Motor: No weakness.     Coordination: Coordination normal.  Psychiatric:        Mood and Affect: Mood normal.        Behavior: Behavior normal.        Thought Content: Thought content normal.        Judgment: Judgment normal.     No results found for any visits on 09/19/21.  Assessment & Plan     Problem List Items Addressed This Visit       Cardiovascular and Mediastinum   Menstrual migraine without status migrainosus, not intractable    Samples of nurtec provided; new contraceptive provided      Relevant Medications   Drospirenone (SLYND) 4 MG TABS   norethindrone (ORTHO MICRONOR) 0.35 MG tablet    cyclobenzaprine (FLEXERIL) 5 MG tablet   escitalopram (LEXAPRO) 10 MG tablet   Rimegepant Sulfate (NURTEC) 75 MG TBDP     Respiratory   Exercise-induced asthma   Relevant Medications   albuterol (VENTOLIN HFA) 108 (90 Base) MCG/ACT inhaler     Other   Obesity    BMI 32.25 Discussed importance of healthy weight management Discussed diet and exercise       Depression    Chronic, stable Pulled for medication refill       Relevant Medications  busPIRone (BUSPAR) 7.5 MG tablet   escitalopram (LEXAPRO) 10 MG tablet   Anxiety and depression    Chronic, stable.      Relevant Medications   busPIRone (BUSPAR) 7.5 MG tablet   escitalopram (LEXAPRO) 10 MG tablet   Encounter for surveillance of contraceptive pills - Primary    New medication provided; generic rx also written incase PA is needed      Relevant Medications   Drospirenone (SLYND) 4 MG TABS   norethindrone (ORTHO MICRONOR) 0.35 MG tablet   Back pain    Pulled for medication refill      Relevant Medications   cyclobenzaprine (FLEXERIL) 5 MG tablet     Return in about 6 months (around 03/20/2022) for annual examination, chonic disease management.     Leilani Merl, FNP, have reviewed all documentation for this visit. The documentation on 09/19/21 for the exam, diagnosis, procedures, and orders are all accurate and complete.   Jacky Kindle, FNP  [facility] [phone] (phone) [phone] (fax)  [facility]

## 2021-09-19 NOTE — Assessment & Plan Note (Signed)
BMI 32.25 Discussed importance of healthy weight management Discussed diet and exercise

## 2021-09-19 NOTE — Assessment & Plan Note (Signed)
New medication provided; generic rx also written incase PA is needed

## 2021-09-19 NOTE — Assessment & Plan Note (Signed)
Pulled for medication refill 

## 2021-09-19 NOTE — Assessment & Plan Note (Signed)
Chronic, stable 

## 2021-09-19 NOTE — Assessment & Plan Note (Signed)
Chronic, stable Pulled for medication refill 

## 2021-09-19 NOTE — Assessment & Plan Note (Signed)
Samples of nurtec provided; new contraceptive provided

## 2021-09-30 ENCOUNTER — Other Ambulatory Visit: Payer: Self-pay | Admitting: Family Medicine

## 2021-09-30 DIAGNOSIS — Z3041 Encounter for surveillance of contraceptive pills: Secondary | ICD-10-CM

## 2021-09-30 NOTE — Telephone Encounter (Signed)
Requested Prescriptions  Pending Prescriptions Disp Refills  . ESTARYLLA 0.25-35 MG-MCG tablet [Pharmacy Med Name: ESTARYLLA TABLETS 28S] 28 tablet 0    Sig: TAKE 1 TABLET BY MOUTH DAILY     OB/GYN:  Contraceptives Passed - 09/30/2021  3:21 AM      Passed - Last BP in normal range    BP Readings from Last 1 Encounters:  09/19/21 110/70         Passed - Valid encounter within last 12 months    Recent Outpatient Visits          1 week ago Encounter for surveillance of contraceptive pills   [facility] Merita Norton T, FNP   9 months ago Depression, unspecified depression type   [facility] Osvaldo Angst M, [place]   11 months ago Other migraine without status migrainosus, not intractable   [facility] [place], [place], [place]   1 year ago Left foot pain   [facility] [place], Lavella Hamm[place], [place]   1 year ago Anxiety   [facility] [place], Lavella [place], [place]      Future Appointments            In 5 months Jacky Kindle, FNP [facility], [facility]

## 2021-12-02 ENCOUNTER — Other Ambulatory Visit: Payer: Self-pay | Admitting: Family Medicine

## 2021-12-02 DIAGNOSIS — J45990 Exercise induced bronchospasm: Secondary | ICD-10-CM

## 2021-12-02 DIAGNOSIS — F419 Anxiety disorder, unspecified: Secondary | ICD-10-CM

## 2021-12-02 DIAGNOSIS — F32A Depression, unspecified: Secondary | ICD-10-CM

## 2021-12-02 MED ORDER — ESCITALOPRAM OXALATE 10 MG PO TABS: ORAL_TABLET | ORAL | 1 refills | Status: DC

## 2021-12-02 NOTE — Telephone Encounter (Signed)
[facility] faxed refill request for the following medications:  escitalopram (LEXAPRO) 10 MG tablet   Please advise.  

## 2021-12-02 NOTE — Addendum Note (Signed)
Addended by: Kavin Leech E on: 12/02/2021 03:29 PM   Modules accepted: Orders

## 2021-12-08 ENCOUNTER — Other Ambulatory Visit: Payer: Self-pay

## 2021-12-08 DIAGNOSIS — G43809 Other migraine, not intractable, without status migrainosus: Secondary | ICD-10-CM

## 2021-12-08 NOTE — Telephone Encounter (Signed)
LOV: 09/19/2021  NOV:  03/20/2022  Last Refill: 06/03/2021 #180 1 Refill  Thanks,   Mickel Baas

## 2021-12-08 NOTE — Telephone Encounter (Signed)
[facility] faxed refill request for the following medications: ° °topiramate (TOPAMAX) 50 MG tablet ° ° °Please advise. °

## 2021-12-09 MED ORDER — TOPIRAMATE 50 MG PO TABS: 100.0000 mg | ORAL_TABLET | Freq: Every evening | ORAL | 1 refills | Status: DC

## 2022-01-02 ENCOUNTER — Other Ambulatory Visit: Payer: Self-pay | Admitting: Family Medicine

## 2022-01-02 DIAGNOSIS — J45990 Exercise induced bronchospasm: Secondary | ICD-10-CM

## 2022-01-02 NOTE — Telephone Encounter (Signed)
Requested Prescriptions  Pending Prescriptions Disp Refills   albuterol (VENTOLIN HFA) 108 (90 Base) MCG/ACT inhaler [Pharmacy Med Name: ALBUTEROL HFA INH (200 PUFFS) 6.7GM] 6.7 g 0    Sig: INHALE 2 PUFFS INTO THE LUNGS EVERY 6 HOURS AS NEEDED FOR WHEEZING OR SHORTNESS OF BREATH     Pulmonology:  [facility] 2 Passed - 01/02/2022  3:32 AM      Passed - Last BP in normal range    BP Readings from Last 1 Encounters:  09/19/21 110/70         Passed - Last Heart Rate in normal range    Pulse Readings from Last 1 Encounters:  09/19/21 71         Passed - Valid encounter within last 12 months    Recent Outpatient Visits          3 months ago Encounter for surveillance of contraceptive pills   [facility] Merita Norton T, FNP   1 year ago Depression, unspecified depression type   [facility] [place], [place], PA-C   1 year ago Other migraine without status migrainosus, not intractable   [facility] [place], [place], [place]   1 year ago Left foot pain   [facility] [place], Lavella Hammock, [place]   1 year ago Anxiety   [facility] [place], Lavella [place], [place]      Future Appointments            In 2 months Jacky Kindle, FNP [facility], [facility]

## 2022-03-20 ENCOUNTER — Ambulatory Visit: Payer: 59 | Admitting: Family Medicine

## 2022-04-04 ENCOUNTER — Other Ambulatory Visit: Payer: Self-pay | Admitting: Family Medicine

## 2022-04-04 DIAGNOSIS — G43809 Other migraine, not intractable, without status migrainosus: Secondary | ICD-10-CM

## 2022-05-28 NOTE — Progress Notes (Signed)
Established patient visit   Patient: Misty Little   DOB: 1976/02/04   46 y.o. Female  MRN: [id number] Visit Date: 06/03/2022  Today's healthcare provider: Jacky Kindle, FNP  Introduced to nurse practitioner role and practice setting.  All questions answered.  Discussed provider/patient relationship and expectations.   I,Tiffany J Bragg,acting as a scribe for Jacky Kindle, FNP.,have documented all relevant documentation on the behalf of Jacky Kindle, FNP,as directed by  Jacky Kindle, FNP while in the presence of Jacky Kindle, FNP.   Chief Complaint  Patient presents with   Anxiety   Depression   Subjective    HPI  Anxiety/Depression, Follow-up  She was last seen for anxiety 9 months ago. Changes made at last visit include continue buspar 7.5mg , and lexapro 10mg    She reports excellent compliance with treatment. She reports excellent tolerance of treatment. She is not having side effects.   She feels her anxiety is mild and Unchanged since last visit.  Symptoms: No chest pain No difficulty concentrating  No dizziness No fatigue  No feelings of losing control No insomnia  No irritable No palpitations  No panic attacks No racing thoughts  No shortness of breath No sweating  No tremors/shakes    GAD-7 Results    05/09/2020    9:44 AM  GAD-7 Generalized Anxiety Disorder Screening Tool  1. Feeling Nervous, Anxious, or on Edge 0  2. Not Being Able to Stop or Control Worrying 0  3. Worrying Too Much About Different Things 0  4. Trouble Relaxing 1  5. Being So Restless it's Hard To Sit Still 0  6. Becoming Easily Annoyed or Irritable 0  7. Feeling Afraid As If Something Awful Might Happen 0  Total GAD-7 Score 1  Difficulty At Work, Home, or Getting  Along With Others? Not difficult at all      PHQ-9 Scores    06/03/2022    9:10 AM 12/09/2020    2:27 PM 10/08/2020    8:46 AM  PHQ9 SCORE ONLY  PHQ-9 Total Score 0 6 2         06/03/2022    9:10 AM  12/09/2020    2:27 PM 10/08/2020    8:46 AM  Depression screen PHQ 2/9  Decreased Interest 0 3 0  Down, Depressed, Hopeless 0 2 1  PHQ - 2 Score 0 5 1  Altered sleeping 0 0 0  Tired, decreased energy 0 1 1  Change in appetite 0 0 0  Feeling bad or failure about yourself  0 0 0  Trouble concentrating 0 0 0  Moving slowly or fidgety/restless 0 0 0  Suicidal thoughts 0 0 0  PHQ-9 Score 0 6 2  Difficult doing work/chores Not difficult at all Not difficult at all Not difficult at all    -----------------------------------------------------------------------------------------   Medications: Outpatient Medications Prior to Visit  Medication Sig   albuterol (VENTOLIN HFA) 108 (90 Base) MCG/ACT inhaler INHALE 2 PUFFS INTO THE LUNGS EVERY 6 HOURS AS NEEDED FOR WHEEZING OR SHORTNESS OF BREATH   cyclobenzaprine (FLEXERIL) 5 MG tablet Take 1 tablet (5 mg total) by mouth 3 (three) times daily as needed (muscle pain).   fluticasone (FLONASE) 50 MCG/ACT nasal spray Place 2 sprays into both nostrils daily.   loratadine (CLARITIN) 10 MG tablet Take by mouth.   Rimegepant Sulfate (NURTEC) 75 MG TBDP Take 1 tablet by mouth daily as needed.   topiramate (TOPAMAX) 50 MG  tablet TAKE 2 TABLETS(100 MG) BY MOUTH EVERY EVENING   [DISCONTINUED] busPIRone (BUSPAR) 7.5 MG tablet Take 1 tablet (7.5 mg total) by mouth 2 (two) times daily.   [DISCONTINUED] Drospirenone (SLYND) 4 MG TABS Take 1 tablet by mouth daily.   [DISCONTINUED] escitalopram (LEXAPRO) 10 MG tablet TAKE 1 TABLET(10 MG) BY MOUTH AT BEDTIME   [DISCONTINUED] norethindrone (ORTHO MICRONOR) 0.35 MG tablet Take 1 tablet (0.35 mg total) by mouth daily.   [DISCONTINUED] SUMAtriptan (IMITREX) 100 MG tablet TAKE 1 TABLET BY MOUTH AS DIRECTED, MAY REPEAT IN 2 HOURS IF HEADACHE PERSISTS OR RECURS   No facility-administered medications prior to visit.    Review of Systems     Objective    BP 107/64 (BP Location: Right Arm, Patient Position: Sitting,  Cuff Size: Normal)   Pulse 63   Temp 97.7 F (36.5 C) (Oral)   Resp 16   Ht 5\' 1"  (1.549 m)   Wt 185 lb 8 oz (84.1 kg)   SpO2 100%   BMI 35.05 kg/m    Physical Exam Vitals and nursing note reviewed.  Constitutional:      General: She is not in acute distress.    Appearance: Normal appearance. She is obese. She is not ill-appearing, toxic-appearing or diaphoretic.  HENT:     Head: Normocephalic and atraumatic.  Cardiovascular:     Rate and Rhythm: Normal rate and regular rhythm.     Pulses: Normal pulses.     Heart sounds: Normal heart sounds. No murmur heard.    No friction rub. No gallop.  Pulmonary:     Effort: Pulmonary effort is normal. No respiratory distress.     Breath sounds: Normal breath sounds. No stridor. No wheezing, rhonchi or rales.  Chest:     Chest wall: No tenderness.  Abdominal:     General: Bowel sounds are normal.     Palpations: Abdomen is soft.  Musculoskeletal:        General: No swelling, tenderness, deformity or signs of injury. Normal range of motion.     Cervical back: Normal range of motion and neck supple.     Right lower leg: No edema.     Left lower leg: No edema.  Skin:    General: Skin is warm and dry.     Capillary Refill: Capillary refill takes less than 2 seconds.     Coloration: Skin is not jaundiced or pale.     Findings: No bruising, erythema, lesion or rash.  Neurological:     General: No focal deficit present.     Mental Status: She is alert and oriented to person, place, and time. Mental status is at baseline.     Cranial Nerves: No cranial nerve deficit.     Sensory: No sensory deficit.     Motor: No weakness.     Coordination: Coordination normal.  Psychiatric:        Mood and Affect: Mood normal.        Behavior: Behavior normal.        Thought Content: Thought content normal.        Judgment: Judgment normal.       No results found for any visits on 06/03/22.  Assessment & Plan     Problem List Items  Addressed This Visit       Cardiovascular and Mediastinum   Menstrual migraine without status migrainosus, not intractable    Nurtec was beneficial; advised PA is needed Will send in current triptan if  not approved Continue to recommend OCPs as well to assist with monthly hormone changes Patient continues to deny use of DEPO or IUD to assist       Relevant Medications   escitalopram (LEXAPRO) 10 MG tablet   norethindrone (ORTHO MICRONOR) 0.35 MG tablet   SUMAtriptan (IMITREX) 100 MG tablet   Rimegepant Sulfate (NURTEC) 75 MG TBDP   Migraine    Change, associated with Sturge-Weber-Syndrome, 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 Controlled with OCPs and triptans Has seen neuro in 2021; recommend MED diet with use of exercise/stress mgmt to assist       Relevant Medications   escitalopram (LEXAPRO) 10 MG tablet   SUMAtriptan (IMITREX) 100 MG tablet   Rimegepant Sulfate (NURTEC) 75 MG TBDP     Nervous and Auditory   Sturge-Weber syndrome (HCC)    Chronic, stable Previously seen by Opthal and Neuro Wears eye glasses Complaints of migraines Denies worsening; continue to manage weight and recommend BP control/stress control         Other   Colon cancer screening declined    Discussed screening options including colo guard and colonoscopy Patient provided with pamphlet Continues to defer/decline screening at this time; no personal concerns nor family hx       Depression, recurrent (HCC) - Primary    Chronic, stable Continue SSRI Continue buspar to assist 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5 MG tablet   escitalopram (LEXAPRO) 10 MG tablet   Mammogram declined    Declines mammogram Due for screening for mammogram, denies breast concerns, provided with phone number to call and schedule appointment for mammogram. Encouraged to repeat breast cancer screening every 1-2 years.         Return in about 6 months (around 12/04/2022) for chonic disease management.      Leilani Merl, FNP, have reviewed all documentation for this visit. The documentation on 06/03/22 for the exam, diagnosis, procedures, and orders are all accurate and complete.    Jacky Kindle, FNP  [facility] [phone] (phone) [phone] (fax)  [facility]

## 2022-05-30 ENCOUNTER — Other Ambulatory Visit: Payer: Self-pay | Admitting: Family Medicine

## 2022-05-30 DIAGNOSIS — G43809 Other migraine, not intractable, without status migrainosus: Secondary | ICD-10-CM

## 2022-06-01 NOTE — Telephone Encounter (Signed)
Requested medication (s) are due for refill today: Yes  Requested medication (s) are on the active medication list: Yes  Last refill:  12/09/21  Future visit scheduled: Yes  Notes to clinic:  Protocol indicates labs are needed.    Requested Prescriptions  Pending Prescriptions Disp Refills   topiramate (TOPAMAX) 50 MG tablet [Pharmacy Med Name: TOPIRAMATE 50MG  TABLETS] 180 tablet 1    Sig: TAKE 2 TABLETS(100 MG) BY MOUTH EVERY EVENING     Neurology: Anticonvulsants - topiramate & zonisamide Failed - 05/30/2022  3:32 AM      Failed - Cr in normal range and within 360 days    Creatinine, Ser  Date Value Ref Range Status  10/24/2018 0.78 0.44 - 1.00 mg/dL Final   Creatinine, Urine  Date Value Ref Range Status  09/07/2014 51.66 mg/dL Final         Failed - CO2 in normal range and within 360 days    CO2  Date Value Ref Range Status  10/24/2018 26 22 - 32 mmol/L Final         Failed - ALT in normal range and within 360 days    ALT  Date Value Ref Range Status  11/26/2016 9 0 - 32 IU/L Final         Failed - AST in normal range and within 360 days    AST  Date Value Ref Range Status  11/26/2016 17 0 - 40 IU/L Final         Failed - Completed PHQ-2 or PHQ-9 in the last 360 days      Passed - Valid encounter within last 12 months    Recent Outpatient Visits           8 months ago Encounter for surveillance of contraceptive pills   [facility], FNP   1 year ago Depression, unspecified depression type   [facility] [place], [place], PA-C   1 year ago Other migraine without status migrainosus, not intractable   [facility] [place], [place], [place]   1 year ago Left foot pain   [facility] M, M   2 years ago Anxiety   [facility] [place], [place], [place]       Future Appointments             In 2 days [place], FNP [facility], [facility]

## 2022-06-03 ENCOUNTER — Encounter: Payer: Self-pay | Admitting: Family Medicine

## 2022-06-03 ENCOUNTER — Telehealth (INDEPENDENT_AMBULATORY_CARE_PROVIDER_SITE_OTHER): Payer: 59 | Admitting: Family Medicine

## 2022-06-03 VITALS — BP 107/64 | HR 63 | Temp 97.7°F | Resp 16 | Ht 61.0 in | Wt 185.5 lb

## 2022-06-03 DIAGNOSIS — Z3041 Encounter for surveillance of contraceptive pills: Secondary | ICD-10-CM

## 2022-06-03 DIAGNOSIS — F411 Generalized anxiety disorder: Secondary | ICD-10-CM | POA: Insufficient documentation

## 2022-06-03 DIAGNOSIS — G43909 Migraine, unspecified, not intractable, without status migrainosus: Secondary | ICD-10-CM | POA: Insufficient documentation

## 2022-06-03 DIAGNOSIS — F339 Major depressive disorder, recurrent, unspecified: Secondary | ICD-10-CM | POA: Insufficient documentation

## 2022-06-03 DIAGNOSIS — Z5320 Procedure and treatment not carried out because of patient's decision for unspecified reasons: Secondary | ICD-10-CM | POA: Insufficient documentation

## 2022-06-03 DIAGNOSIS — Q8589 Other phakomatoses, not elsewhere classified: Secondary | ICD-10-CM

## 2022-06-03 DIAGNOSIS — G43809 Other migraine, not intractable, without status migrainosus: Secondary | ICD-10-CM

## 2022-06-03 DIAGNOSIS — G43829 Menstrual migraine, not intractable, without status migrainosus: Secondary | ICD-10-CM

## 2022-06-03 MED ORDER — NURTEC 75 MG PO TBDP: 75.0000 mg | ORAL_TABLET | ORAL | 0 refills | Status: DC | PRN

## 2022-06-03 MED ORDER — NORETHINDRONE 0.35 MG PO TABS: 1.0000 | ORAL_TABLET | Freq: Every day | ORAL | 4 refills | Status: DC

## 2022-06-03 MED ORDER — BUSPIRONE HCL 7.5 MG PO TABS: 7.5000 mg | ORAL_TABLET | Freq: Two times a day (BID) | ORAL | 1 refills | Status: DC

## 2022-06-03 MED ORDER — ESCITALOPRAM OXALATE 10 MG PO TABS: ORAL_TABLET | ORAL | 1 refills | Status: DC

## 2022-06-03 MED ORDER — SUMATRIPTAN SUCCINATE 100 MG PO TABS: ORAL_TABLET | ORAL | 5 refills | Status: DC

## 2022-06-03 NOTE — Assessment & Plan Note (Signed)
Chronic, stable use Continue micronor to assist with migraines

## 2022-06-03 NOTE — Assessment & Plan Note (Signed)
Discussed screening options including colo guard and colonoscopy Patient provided with pamphlet Continues to defer/decline screening at this time; no personal concerns nor family hx

## 2022-06-03 NOTE — Assessment & Plan Note (Signed)
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

## 2022-06-03 NOTE — Assessment & Plan Note (Signed)
Declines mammogram Due for screening for mammogram, denies breast concerns, provided with phone number to call and schedule appointment for mammogram. Encouraged to repeat breast cancer screening every 1-2 years.

## 2022-06-03 NOTE — Assessment & Plan Note (Signed)
Chronic, stable Previously seen by Opthal and Neuro Wears eye glasses Complaints of migraines Denies worsening; continue to manage weight and recommend BP control/stress control

## 2022-06-03 NOTE — Assessment & Plan Note (Signed)
Nurtec was beneficial; advised PA is needed Will send in current triptan if not approved Continue to recommend OCPs as well to assist with monthly hormone changes Patient continues to deny use of DEPO or IUD to assist

## 2022-06-03 NOTE — Assessment & Plan Note (Signed)
Chronic, stable Continue to use SSRI and buspar to assist Declines psych referral Continue to emphasize SMART goals for self care prioritization Denies SI or HI

## 2022-06-03 NOTE — Assessment & Plan Note (Signed)
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

## 2022-09-15 ENCOUNTER — Other Ambulatory Visit: Payer: Self-pay | Admitting: Family Medicine

## 2022-09-15 DIAGNOSIS — G43809 Other migraine, not intractable, without status migrainosus: Secondary | ICD-10-CM

## 2022-09-15 NOTE — Telephone Encounter (Signed)
last RF 06/03/22 #180 1 RF (should have enough to last until Jan 2024)  Requested Prescriptions  Refused Prescriptions Disp Refills  . topiramate (TOPAMAX) 50 MG tablet [Pharmacy Med Name: TOPIRAMATE 50MG  TABLETS] 180 tablet 1    Sig: TAKE 2 TABLETS(100 MG) BY MOUTH EVERY EVENING     Neurology: Anticonvulsants - topiramate & zonisamide Failed - 09/15/2022  8:04 AM      Failed - Cr in normal range and within 360 days    Creatinine, Ser  Date Value Ref Range Status  10/24/2018 0.78 0.44 - 1.00 mg/dL Final   Creatinine, Urine  Date Value Ref Range Status  09/07/2014 51.66 mg/dL Final         Failed - CO2 in normal range and within 360 days    CO2  Date Value Ref Range Status  10/24/2018 26 22 - 32 mmol/L Final         Failed - ALT in normal range and within 360 days    ALT  Date Value Ref Range Status  11/26/2016 9 0 - 32 IU/L Final         Failed - AST in normal range and within 360 days    AST  Date Value Ref Range Status  11/26/2016 17 0 - 40 IU/L Final         Passed - Completed PHQ-2 or PHQ-9 in the last 360 days      Passed - Valid encounter within last 12 months    Recent Outpatient Visits          3 months ago Depression, recurrent [facility])   [facility] Gwyneth Sprout, FNP   12 months ago Encounter for surveillance of contraceptive pills   [facility] Tally Joe T, FNP   1 year ago Depression, unspecified depression type   [place], [place], PA-C   1 year ago Other migraine without status migrainosus, not intractable   [place], [place]   2 years ago Left foot pain   [place], [place], [place]

## 2022-11-09 ENCOUNTER — Other Ambulatory Visit: Payer: Self-pay | Admitting: Family Medicine

## 2022-11-09 DIAGNOSIS — G43829 Menstrual migraine, not intractable, without status migrainosus: Secondary | ICD-10-CM

## 2022-11-09 DIAGNOSIS — Z3041 Encounter for surveillance of contraceptive pills: Secondary | ICD-10-CM

## 2023-06-30 ENCOUNTER — Other Ambulatory Visit: Payer: Self-pay

## 2023-06-30 ENCOUNTER — Ambulatory Visit: Payer: Self-pay | Admitting: *Deleted

## 2023-06-30 ENCOUNTER — Emergency Department: Payer: 59

## 2023-06-30 ENCOUNTER — Emergency Department
Admission: EM | Admit: 2023-06-30 | Discharge: 2023-06-30 | Disposition: A | Discharge status: Home / Self Care | Payer: 59 | Attending: Emergency Medicine | Admitting: Emergency Medicine

## 2023-06-30 DIAGNOSIS — R110 Nausea: Secondary | ICD-10-CM | POA: Insufficient documentation

## 2023-06-30 DIAGNOSIS — H539 Unspecified visual disturbance: Secondary | ICD-10-CM | POA: Diagnosis not present

## 2023-06-30 DIAGNOSIS — R519 Headache, unspecified: Secondary | ICD-10-CM | POA: Insufficient documentation

## 2023-06-30 DIAGNOSIS — R42 Dizziness and giddiness: Secondary | ICD-10-CM | POA: Diagnosis present

## 2023-06-30 LAB — CBC WITH DIFFERENTIAL/PLATELET
Abs Immature Granulocytes: 0 10*3/uL (ref 0.00–0.07)
Basophils Absolute: 0 10*3/uL (ref 0.0–0.1)
Basophils Relative: 1 %
Eosinophils Absolute: 0.1 10*3/uL (ref 0.0–0.5)
Eosinophils Relative: 1 %
HCT: 40.2 % (ref 36.0–46.0)
Hemoglobin: 13.4 g/dL (ref 12.0–15.0)
Immature Granulocytes: 0 %
Lymphocytes Relative: 31 %
Lymphs Abs: 1.8 10*3/uL (ref 0.7–4.0)
MCH: 32.8 pg (ref 26.0–34.0)
MCHC: 33.3 g/dL (ref 30.0–36.0)
MCV: 98.5 fL (ref 80.0–100.0)
Monocytes Absolute: 0.6 10*3/uL (ref 0.1–1.0)
Monocytes Relative: 10 %
Neutro Abs: 3.4 10*3/uL (ref 1.7–7.7)
Neutrophils Relative %: 57 %
Platelets: 213 10*3/uL (ref 150–400)
RBC: 4.08 MIL/uL (ref 3.87–5.11)
RDW: 12.8 % (ref 11.5–15.5)
WBC: 5.8 10*3/uL (ref 4.0–10.5)
nRBC: 0 % (ref 0.0–0.2)

## 2023-06-30 LAB — COMPREHENSIVE METABOLIC PANEL
ALT: 13 U/L (ref 0–44)
AST: 25 U/L (ref 15–41)
Albumin: 3.9 g/dL (ref 3.5–5.0)
Alkaline Phosphatase: 40 U/L (ref 38–126)
Anion gap: 3 — ABNORMAL LOW (ref 5–15)
BUN: 21 mg/dL — ABNORMAL HIGH (ref 6–20)
CO2: 24 mmol/L (ref 22–32)
Calcium: 8.4 mg/dL — ABNORMAL LOW (ref 8.9–10.3)
Chloride: 107 mmol/L (ref 98–111)
Creatinine, Ser: 0.81 mg/dL (ref 0.44–1.00)
GFR, Estimated: >60 mL/min (ref >60)
Glucose, Bld: 104 mg/dL — ABNORMAL HIGH (ref 70–99)
Potassium: 4.1 mmol/L (ref 3.5–5.1)
Sodium: 134 mmol/L — ABNORMAL LOW (ref 135–145)
Total Bilirubin: 0.3 mg/dL (ref 0.3–1.2)
Total Protein: 7.1 g/dL (ref 6.5–8.1)

## 2023-06-30 LAB — PROTIME-INR
INR: 1.1 (ref 0.8–1.2)
Prothrombin Time: 14.2 seconds (ref 11.4–15.2)

## 2023-06-30 LAB — POC URINE PREG, ED: Preg Test, Ur: NEGATIVE

## 2023-06-30 LAB — ETHANOL: Alcohol, Ethyl (B): <10 mg/dL (ref <10)

## 2023-06-30 LAB — APTT: aPTT: 27 seconds (ref 24–36)

## 2023-06-30 MED ORDER — ACETAMINOPHEN 325 MG PO TABS
650.0000 mg | ORAL_TABLET | Freq: Once | ORAL | Status: AC
  Administered 2023-06-30: 650 mg via ORAL
  Filled 2023-06-30: qty 2

## 2023-06-30 MED ORDER — SODIUM CHLORIDE 0.9% FLUSH: 3.0000 mL | Freq: Once | INTRAVENOUS | Status: DC

## 2023-06-30 MED ORDER — MECLIZINE HCL 25 MG PO TABS: 25.0000 mg | ORAL_TABLET | Freq: Three times a day (TID) | ORAL | 0 refills | Status: DC | PRN

## 2023-06-30 NOTE — Telephone Encounter (Signed)
Message from [place] M sent at 06/30/2023  2:32 PM EDT  Summary: experiencing severe dizzy spells   Pt husband stated pt is experiencing severe dizzy spells that started today. Pt is not with her; she is at work.  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 The last appointment available as of pt husband calling for tomorrow is at 3:40, so I scheduled pt.  Seeking clinical advice. Please call wife directly.  [phone]         Chief Complaint: dizziness Symptoms: vertigo, ataxia, nausea, blurred vision, elevated BP Frequency: today Pertinent Negatives: Patient denies chest pain , weakness or numbness or tingling to face, arms or legs Disposition: [] ED /[x] Urgent Care (no appt availability in office) / [] Appointment(In office/virtual)/ []  [place] [facility]/ [] Home Care/ [] Refused Recommended Disposition /[] [facility]/ []  Follow-up with PCP Additional Notes: pt advised that UC  may send her to ED.   Reason for Disposition  [1] Dizziness caused by heat exposure, sudden standing, or poor fluid intake AND [2] no improvement after 2 hours of rest and fluids  Answer Assessment - Initial Assessment Questions 1. DESCRIPTION: "Describe your dizziness."     Felt drunk couldn't walk straight 2. LIGHTHEADED: "Do you feel lightheaded?" (e.g., somewhat faint, woozy, weak upon standing)     yes 3. VERTIGO: "Do you feel like either you or the room is spinning or tilting?" (i.e. vertigo)     Yes room spinning 4. SEVERITY: "How bad is it?"  "Do you feel like you are going to faint?" "Can you stand and walk?"   - MILD: Feels slightly dizzy, but walking normally.   - MODERATE: Feels unsteady when walking, but not falling; interferes with normal activities (e.g., school, work).   - SEVERE: Unable to walk without falling, or requires assistance to walk without falling; feels like passing out now.      Mild  5. ONSET:  "When did the dizziness begin?"     This am  6. AGGRAVATING  FACTORS: "Does anything make it worse?" (e.g., standing, change in head position)     Standing, laying  10. OTHER SYMPTOMS: "Do you have any other symptoms?" (e.g., fever, chest pain, vomiting, diarrhea, bleeding)       140/90 approximately 136/81 136/84 , vision slightly blurry today  Protocols used: Dizziness - Lightheadedness-A-AH

## 2023-06-30 NOTE — Telephone Encounter (Signed)
Pt has appt tomorrow- please advise pt if she needs to cancel

## 2023-06-30 NOTE — ED Notes (Signed)
This RN attempted bllod x 2. CT at bedside to take for CT. Will attempt blood again when back to triage.

## 2023-06-30 NOTE — ED Provider Notes (Signed)
[facility] Provider Note    Event Date/Time   First MD Initiated Contact with Patient 06/30/23 1759     (approximate)   History   Dizziness (Since 0700AM)   HPI  Misty Little is a 47 y.o. female  with PMH of with PMH Sturge-Weber syndrome with glaucoma, seizures, depression who presents for evaluation of dizziness, headache, vision changes and nausea since this morning.  Patient described her dizziness as room spinning and states it has been intermittent throughout the day.  She notices it is worse with movement of her head.  She also notes she has had blurry vision.  She has a history of migraines but her headache is much milder.      Physical Exam   Triage Vital Signs: ED Triage Vitals  Encounter Vitals Group     BP 06/30/23 1738 119/77     Systolic BP Percentile --      Diastolic BP Percentile --      Pulse Rate 06/30/23 1738 86     Resp 06/30/23 1738 16     Temp 06/30/23 1738 98.3 F (36.8 C)     Temp Source 06/30/23 1738 Oral     SpO2 06/30/23 1738 99 %     Weight --      Height 06/30/23 1727 5\' 1"  (1.549 m)     Head Circumference --      Peak Flow --      Pain Score 06/30/23 1726 0     Pain Loc --      Pain Education --      Exclude from Growth Chart --     Most recent vital signs: Vitals:   06/30/23 1738  BP: 119/77  Pulse: 86  Resp: 16  Temp: 98.3 F (36.8 C)  SpO2: 99%   General: Awake, no distress.  CV:  Good peripheral perfusion.  RRR. Resp:  Normal effort.  CTAB. Abd:  No distention.  Other:  Bilateral EACs clear, bilateral TMs translucent.   ED Results / Procedures / Treatments   Labs (all labs ordered are listed, but only abnormal results are displayed) Labs Reviewed  COMPREHENSIVE METABOLIC PANEL - Abnormal; Notable for the following components:      Result Value   Sodium 134 (*)    Glucose, Bld 104 (*)    BUN 21 (*)    Calcium 8.4 (*)    Anion gap 3 (*)    All other components within normal limits   PROTIME-INR  APTT  ETHANOL  CBC WITH DIFFERENTIAL/PLATELET  POC URINE PREG, ED    RADIOLOGY  CT head obtained, interpreted the images as well as reviewed the radiologist report, which does not show any acute intracranial abnormality.  PROCEDURES:  Critical Care performed: No  Procedures   MEDICATIONS ORDERED IN ED: Medications  acetaminophen (TYLENOL) tablet 650 mg (650 mg Oral Given 06/30/23 1825)     IMPRESSION / MDM / ASSESSMENT AND PLAN / ED COURSE  I reviewed the triage vital signs and the nursing notes.                             47 year old female presents for evaluation of dizziness, VSS in triage and patient NAD on exam. Differential diagnosis includes, but is not limited to, BPPV, cerebellar stroke, labyrinthitis, migraine.  Patient's presentation is most consistent with acute complicated illness / injury requiring diagnostic workup.  CBC, CMP, ethanol, PT INR,  APTT all WNL.  Pregnancy test negative.  CT scan obtained due to patient's mention of blurry vision.  I interpreted the images as well as reviewed the radiologist report which was negative for any acute intracranial abnormality.  Given patient's reassuring lab work and CT scan I feel she is appropriate for outpatient management.  Patient's description of her vertigo as the room spinning and being intermittent is consistent with a peripheral cause.  I will treat patient symptomatically with meclizine and will give her follow-up for ENT if she continues to be symptomatic.  Patient voiced understanding, all questions were answered and she was stable at discharge.      FINAL CLINICAL IMPRESSION(S) / ED DIAGNOSES   Final diagnoses:  Vertigo     Rx / DC Orders   ED Discharge Orders          Ordered    meclizine (ANTIVERT) 25 MG tablet  3 times daily PRN        06/30/23 1859             Note:  This document was prepared using Dragon voice recognition software and may include unintentional  dictation errors.   Cameron Ali, PA-C 06/30/23 1901    Minna Antis, MD 06/30/23 2003

## 2023-06-30 NOTE — ED Triage Notes (Addendum)
Pt to ed from home via POV for dizziness, headache, vision changes and nausea. Pt advised these symptoms started at 0700 this morning. She went to work and worked a full day today while being dizzy. Pt is caox4, in no acute distress and ambulatory in triage.

## 2023-06-30 NOTE — Telephone Encounter (Signed)
Summary: experiencing severe dizzy spells   Pt husband stated pt is experiencing severe dizzy spells that started today. Pt is not with her; she is at work.  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 The last appointment available as of pt husband calling for tomorrow is at 3:40, so I scheduled pt.  Seeking clinical advice. Please call wife directly.  [phone]     Attempted to call patient- no answer- left message on VM to call office

## 2023-06-30 NOTE — Discharge Instructions (Addendum)
Your blood work and CT scan were normal.  Take the meclizine up to 3 times daily as needed for dizziness.  I have attached information for Dr. Andee Poles who is an ENT doctor.  Schedule an appointment with him if you continue to have symptoms.  You can always return to the ED with any new or worsening symptoms.

## 2023-07-01 ENCOUNTER — Ambulatory Visit (INDEPENDENT_AMBULATORY_CARE_PROVIDER_SITE_OTHER): Payer: 59 | Admitting: Family Medicine

## 2023-07-01 VITALS — BP 113/50 | HR 87 | Ht 61.0 in | Wt 212.1 lb

## 2023-07-01 DIAGNOSIS — G43809 Other migraine, not intractable, without status migrainosus: Secondary | ICD-10-CM

## 2023-07-01 DIAGNOSIS — J45990 Exercise induced bronchospasm: Secondary | ICD-10-CM

## 2023-07-01 DIAGNOSIS — Q8589 Other phakomatoses, not elsewhere classified: Secondary | ICD-10-CM | POA: Diagnosis not present

## 2023-07-01 DIAGNOSIS — R7309 Other abnormal glucose: Secondary | ICD-10-CM

## 2023-07-01 DIAGNOSIS — Z1159 Encounter for screening for other viral diseases: Secondary | ICD-10-CM

## 2023-07-01 DIAGNOSIS — Z1231 Encounter for screening mammogram for malignant neoplasm of breast: Secondary | ICD-10-CM

## 2023-07-01 DIAGNOSIS — F39 Unspecified mood [affective] disorder: Secondary | ICD-10-CM

## 2023-07-01 DIAGNOSIS — Z1211 Encounter for screening for malignant neoplasm of colon: Secondary | ICD-10-CM

## 2023-07-01 DIAGNOSIS — R42 Dizziness and giddiness: Secondary | ICD-10-CM

## 2023-07-01 DIAGNOSIS — E871 Hypo-osmolality and hyponatremia: Secondary | ICD-10-CM

## 2023-07-01 MED ORDER — SUMATRIPTAN SUCCINATE 100 MG PO TABS: ORAL_TABLET | ORAL | 5 refills | Status: DC

## 2023-07-01 MED ORDER — ESCITALOPRAM OXALATE 10 MG PO TABS
ORAL_TABLET | ORAL | 1 refills | Status: DC
Start: 2023-07-01 — End: 2023-12-03

## 2023-07-01 MED ORDER — BUSPIRONE HCL 7.5 MG PO TABS
7.5000 mg | ORAL_TABLET | Freq: Two times a day (BID) | ORAL | 1 refills | Status: DC
Start: 2023-07-01 — End: 2023-12-03

## 2023-07-01 MED ORDER — TOPIRAMATE 100 MG PO TABS
100.0000 mg | ORAL_TABLET | Freq: Every day | ORAL | 3 refills | Status: DC
Start: 2023-07-01 — End: 2024-06-05

## 2023-07-01 MED ORDER — ALBUTEROL SULFATE HFA 108 (90 BASE) MCG/ACT IN AERS
2.0000 | INHALATION_SPRAY | Freq: Four times a day (QID) | RESPIRATORY_TRACT | 2 refills | Status: AC | PRN
Start: 2023-07-01 — End: ?

## 2023-07-01 NOTE — Assessment & Plan Note (Signed)
Due for screening for mammogram, denies breast concerns, provided with phone number to call and schedule appointment for mammogram. Encouraged to repeat breast cancer screening every 1-2 years.  

## 2023-07-01 NOTE — Assessment & Plan Note (Signed)
Acute, self limiting  Unknown cause Normal exam Has been off meds d/t lack of follow up

## 2023-07-01 NOTE — Assessment & Plan Note (Signed)
Acute iso vertigo Repeat MP

## 2023-07-01 NOTE — Assessment & Plan Note (Signed)
Chronic, variable More anxiety than depression Continue buspar and lexapro to assist F/u as needed with restart

## 2023-07-01 NOTE — Telephone Encounter (Signed)
Detailed vm left per dpr. CRM created. Ok for [facility] to advise is patient returns call

## 2023-07-01 NOTE — Assessment & Plan Note (Signed)
Chronic, stable Has been off meds d/t lack of f/u Restart today

## 2023-07-01 NOTE — Assessment & Plan Note (Signed)
Low risk screen Treatable, and curable. If left untreated Hep C can lead to cirrhosis and liver failure. Encourage routine testing; recommend repeat testing if risk factors change.  

## 2023-07-01 NOTE — Telephone Encounter (Signed)
Patient seen in office

## 2023-07-01 NOTE — Assessment & Plan Note (Signed)
Low risk; due for initial scan

## 2023-07-01 NOTE — Progress Notes (Signed)
Established patient visit   Patient: Misty Little   DOB: 05-11-1976   47 y.o. Female  MRN: [id number] Visit Date: 07/01/2023  Today's healthcare provider: Jacky Kindle, FNP  Introduced to nurse practitioner role and practice setting.  All questions answered.  Discussed provider/patient relationship and expectations.  Subjective    HPI HPI   Patient reports headaches more than normal, aware of hx of vertigo and has problems with memory. Reports she also had nausea, blurred vision and high blood pressure yesterday.  Patient also reports she believes she is premenopausal -declined colonoscopy and cologuard Last edited by Acey Lav, CMA on 07/01/2023  4:01 PM.      Seen at [facility] 7/31 for Vertigo; put on meclizine   Medications: Outpatient Medications Prior to Visit  Medication Sig   cyclobenzaprine (FLEXERIL) 5 MG tablet Take 1 tablet (5 mg total) by mouth 3 (three) times daily as needed (muscle pain).   fluticasone (FLONASE) 50 MCG/ACT nasal spray Place 2 sprays into both nostrils daily.   loratadine (CLARITIN) 10 MG tablet Take by mouth.   meclizine (ANTIVERT) 25 MG tablet Take 1 tablet (25 mg total) by mouth 3 (three) times daily as needed for dizziness.   norethindrone (MICRONOR) 0.35 MG tablet TAKE 1 TABLET(0.35 MG) BY MOUTH DAILY   [DISCONTINUED] albuterol (VENTOLIN HFA) 108 (90 Base) MCG/ACT inhaler INHALE 2 PUFFS INTO THE LUNGS EVERY 6 HOURS AS NEEDED FOR WHEEZING OR SHORTNESS OF BREATH   [DISCONTINUED] busPIRone (BUSPAR) 7.5 MG tablet Take 1 tablet (7.5 mg total) by mouth 2 (two) times daily.   [DISCONTINUED] escitalopram (LEXAPRO) 10 MG tablet TAKE 1 TABLET(10 MG) BY MOUTH AT BEDTIME   [DISCONTINUED] SUMAtriptan (IMITREX) 100 MG tablet May repeat in 2 hours if headache persists or recurs.   [DISCONTINUED] Rimegepant Sulfate (NURTEC) 75 MG TBDP Take 1 tablet by mouth daily as needed. (Patient not taking: Reported on 07/01/2023)   [DISCONTINUED] Rimegepant Sulfate (NURTEC)  75 MG TBDP Take 75 mg by mouth as needed (migraine).   [DISCONTINUED] topiramate (TOPAMAX) 50 MG tablet TAKE 2 TABLETS(100 MG) BY MOUTH EVERY EVENING (Patient not taking: Reported on 07/01/2023)   No facility-administered medications prior to visit.     Objective    BP (!) 113/50 (BP Location: Right Arm, Patient Position: Sitting, Cuff Size: Large)   Pulse 87   Ht 5\' 1"  (1.549 m)   Wt 212 lb 1.6 oz (96.2 kg)   SpO2 97%   BMI 40.08 kg/m   Physical Exam Vitals and nursing note reviewed.  Constitutional:      General: She is not in acute distress.    Appearance: Normal appearance. She is obese. She is not ill-appearing, toxic-appearing or diaphoretic.  HENT:     Head: Normocephalic and atraumatic.  Cardiovascular:     Rate and Rhythm: Normal rate and regular rhythm.     Pulses: Normal pulses.     Heart sounds: Normal heart sounds. No murmur heard.    No friction rub. No gallop.  Pulmonary:     Effort: Pulmonary effort is normal. No respiratory distress.     Breath sounds: Normal breath sounds. No stridor. No wheezing, rhonchi or rales.  Chest:     Chest wall: No tenderness.  Musculoskeletal:        General: No swelling, tenderness, deformity or signs of injury. Normal range of motion.     Right lower leg: No edema.     Left lower leg: No edema.  Skin:  General: Skin is warm and dry.     Capillary Refill: Capillary refill takes less than 2 seconds.     Coloration: Skin is not jaundiced or pale.     Findings: No bruising, erythema, lesion or rash.  Neurological:     General: No focal deficit present.     Mental Status: She is alert and oriented to person, place, and time. Mental status is at baseline.     Cranial Nerves: No cranial nerve deficit.     Sensory: No sensory deficit.     Motor: No weakness.     Coordination: Coordination normal.  Psychiatric:        Mood and Affect: Mood normal.        Behavior: Behavior normal.        Thought Content: Thought content normal.         Judgment: Judgment normal.     No results found for any visits on 07/01/23.  Assessment & Plan     Problem List Items Addressed This Visit       Cardiovascular and Mediastinum   Migraine    Chronic, stable Has been off meds d/t lack of f/u Restart today      Relevant Medications   escitalopram (LEXAPRO) 10 MG tablet   SUMAtriptan (IMITREX) 100 MG tablet   topiramate (TOPAMAX) 100 MG tablet     Respiratory   Exercise-induced asthma   Relevant Medications   albuterol (VENTOLIN HFA) 108 (90 Base) MCG/ACT inhaler     Nervous and Auditory   Sturge-Weber syndrome (HCC)    Chronic, stable Restart topamax to assist         Other   Elevated glucose    Continue to recommend balanced, lower carb meals. Smaller meal size, adding snacks. Choosing water as drink of choice and increasing purposeful exercise. Check A1c given morbid obesity Body mass index is 40.08 kg/m.       Relevant Orders   Hemoglobin A1c   Encounter for hepatitis C screening test for low risk patient    Low risk screen Treatable, and curable. If left untreated Hep C can lead to cirrhosis and liver failure. Encourage routine testing; recommend repeat testing if risk factors change.       Relevant Orders   Hepatitis C antibody   Hyponatremia    Acute iso vertigo Repeat MP      Relevant Orders   Basic metabolic panel   Mood disorder (HCC)    Chronic, variable More anxiety than depression Continue buspar and lexapro to assist F/u as needed with restart       Relevant Medications   busPIRone (BUSPAR) 7.5 MG tablet   escitalopram (LEXAPRO) 10 MG tablet   Screen for colon cancer    Low risk; due for initial scan       Relevant Orders   Ambulatory referral to Gastroenterology   Screening mammogram for breast cancer    Due for screening for mammogram, denies breast concerns, provided with phone number to call and schedule appointment for mammogram. Encouraged to repeat breast cancer  screening every 1-2 years.       Relevant Orders   MM 3D SCREENING MAMMOGRAM BILATERAL BREAST   Vertigo - Primary    Acute, self limiting  Unknown cause Normal exam Has been off meds d/t lack of follow up      Relevant Orders   CBC   Basic metabolic panel   TSH   Lipid panel   Hemoglobin A1c  Vitamin B12   Return if symptoms worsen or fail to improve.     Leilani Merl, FNP, have reviewed all documentation for this visit. The documentation on 07/01/23 for the exam, diagnosis, procedures, and orders are all accurate and complete.  Jacky Kindle, FNP  [facility] [phone] (phone) [phone] (fax)  [facility]

## 2023-07-01 NOTE — Assessment & Plan Note (Signed)
Continue to recommend balanced, lower carb meals. Smaller meal size, adding snacks. Choosing water as drink of choice and increasing purposeful exercise. Check A1c given morbid obesity Body mass index is 40.08 kg/m.

## 2023-07-01 NOTE — Assessment & Plan Note (Signed)
Chronic, stable Restart topamax to assist

## 2023-07-01 NOTE — Patient Instructions (Signed)
Please call and schedule your mammogram:  [facility]  [address], [address] [facility] [place],  [place]  [address] Get Driving Directions Main: [phone]  Sunday:Closed Monday:7:20 AM - 5:00 PM Tuesday:7:20 AM - 5:00 PM Wednesday:7:20 AM - 5:00 PM Thursday:7:20 AM - 5:00 PM Friday:7:20 AM - 4:30 PM Saturday:Closed  

## 2023-07-08 ENCOUNTER — Telehealth: Payer: Self-pay | Admitting: *Deleted

## 2023-07-08 ENCOUNTER — Other Ambulatory Visit: Payer: Self-pay | Admitting: *Deleted

## 2023-07-08 DIAGNOSIS — Z1211 Encounter for screening for malignant neoplasm of colon: Secondary | ICD-10-CM

## 2023-07-08 MED ORDER — NA SULFATE-K SULFATE-MG SULF 17.5-3.13-1.6 GM/177ML PO SOLN: 1.0000 | Freq: Once | ORAL | 0 refills | Status: AC

## 2023-07-08 NOTE — Telephone Encounter (Signed)
Gastroenterology Pre-Procedure Review  Request Date: 08/19/2023 Requesting Physician: Dr. Tobi Bastos  PATIENT REVIEW QUESTIONS: The patient responded to the following health history questions as indicated:    1. Are you having any GI issues? no 2. Do you have a personal history of Polyps? no 3. Do you have a family history of Colon Cancer or Polyps? no 4. Diabetes Mellitus? no 5. Joint replacements in the past 12 months?no 6. Major health problems in the past 3 months?no 7. Any artificial heart valves, MVP, or defibrillator?no    MEDICATIONS & ALLERGIES:    Patient reports the following regarding taking any anticoagulation/antiplatelet therapy:   Plavix, Coumadin, Eliquis, Xarelto, Lovenox, Pradaxa, Brilinta, or Effient? no Aspirin? no  Patient confirms/reports the following medications:  Current Outpatient Medications  Medication Sig Dispense Refill   albuterol (VENTOLIN HFA) 108 (90 Base) MCG/ACT inhaler Inhale 2 puffs into the lungs every 6 (six) hours as needed for wheezing or shortness of breath. 6.7 g 2   busPIRone (BUSPAR) 7.5 MG tablet Take 1 tablet (7.5 mg total) by mouth 2 (two) times daily. 180 tablet 1   cyclobenzaprine (FLEXERIL) 5 MG tablet Take 1 tablet (5 mg total) by mouth 3 (three) times daily as needed (muscle pain). 30 tablet 3   escitalopram (LEXAPRO) 10 MG tablet TAKE 1 TABLET(10 MG) BY MOUTH AT BEDTIME 90 tablet 1   fluticasone (FLONASE) 50 MCG/ACT nasal spray Place 2 sprays into both nostrils daily. 16 g 0   loratadine (CLARITIN) 10 MG tablet Take by mouth.     meclizine (ANTIVERT) 25 MG tablet Take 1 tablet (25 mg total) by mouth 3 (three) times daily as needed for dizziness. 30 tablet 0   norethindrone (MICRONOR) 0.35 MG tablet TAKE 1 TABLET(0.35 MG) BY MOUTH DAILY 84 tablet 4   SUMAtriptan (IMITREX) 100 MG tablet May repeat in 2 hours if headache persists or recurs. 10 tablet 5   topiramate (TOPAMAX) 100 MG tablet Take 1 tablet (100 mg total) by mouth daily. 90  tablet 3   No current facility-administered medications for this visit.    Patient confirms/reports the following allergies:  Allergies  Allergen Reactions   Black Walnut Pollen Allergy Skin Test     Other reaction(s): Unknown Other reaction(s): Other (See Comments) Mouth sores   Other Other (See Comments)    Pt states that she is allergic to walnuts- Reaction:  Causes tongue to hurt.     No orders of the defined types were placed in this encounter.   AUTHORIZATION INFORMATION Primary Insurance: 1D#: Group #:  Secondary Insurance: 1D#: Group #:  SCHEDULE INFORMATION: Date: 08/19/2023 Time: Location:  [facility]

## 2023-07-09 ENCOUNTER — Telehealth: Payer: Self-pay | Admitting: Gastroenterology

## 2023-07-09 NOTE — Telephone Encounter (Signed)
Just doing a follow up call patient was on my list.

## 2023-07-29 ENCOUNTER — Telehealth: Payer: Self-pay | Admitting: Gastroenterology

## 2023-07-29 NOTE — Telephone Encounter (Addendum)
I have called patient back and she stated that she cannot afford it.

## 2023-07-29 NOTE — Telephone Encounter (Signed)
Patient called and left on the vm to cancel her procedure. I called her back to let her know we receive the message.

## 2023-08-19 ENCOUNTER — Ambulatory Visit: Admit: 2023-08-19 | Payer: 59 | Admitting: Gastroenterology

## 2023-08-19 SURGERY — COLONOSCOPY WITH PROPOFOL
Anesthesia: General

## 2023-10-17 ENCOUNTER — Other Ambulatory Visit: Payer: Self-pay

## 2023-10-17 ENCOUNTER — Emergency Department
Admission: EM | Admit: 2023-10-17 | Discharge: 2023-10-17 | Disposition: A | Discharge status: Home / Self Care | Payer: No Typology Code available for payment source | Attending: Emergency Medicine | Admitting: Emergency Medicine

## 2023-10-17 ENCOUNTER — Encounter: Payer: Self-pay | Admitting: Intensive Care

## 2023-10-17 ENCOUNTER — Emergency Department: Payer: No Typology Code available for payment source

## 2023-10-17 DIAGNOSIS — W270XXA Contact with workbench tool, initial encounter: Secondary | ICD-10-CM | POA: Diagnosis not present

## 2023-10-17 DIAGNOSIS — S92515B Nondisplaced fracture of proximal phalanx of left lesser toe(s), initial encounter for open fracture: Secondary | ICD-10-CM | POA: Diagnosis not present

## 2023-10-17 DIAGNOSIS — S99921A Unspecified injury of right foot, initial encounter: Secondary | ICD-10-CM | POA: Diagnosis present

## 2023-10-17 DIAGNOSIS — S92514B Nondisplaced fracture of proximal phalanx of right lesser toe(s), initial encounter for open fracture: Secondary | ICD-10-CM

## 2023-10-17 DIAGNOSIS — Z23 Encounter for immunization: Secondary | ICD-10-CM | POA: Insufficient documentation

## 2023-10-17 DIAGNOSIS — S91114A Laceration without foreign body of right lesser toe(s) without damage to nail, initial encounter: Secondary | ICD-10-CM | POA: Insufficient documentation

## 2023-10-17 MED ORDER — TETANUS-DIPHTH-ACELL PERTUSSIS 5-2.5-18.5 LF-MCG/0.5 IM SUSY
0.5000 mL | PREFILLED_SYRINGE | Freq: Once | INTRAMUSCULAR | Status: AC
  Administered 2023-10-17: 0.5 mL via INTRAMUSCULAR
  Filled 2023-10-17: qty 0.5

## 2023-10-17 MED ORDER — CEPHALEXIN 500 MG PO CAPS: 500.0000 mg | ORAL_CAPSULE | Freq: Four times a day (QID) | ORAL | 0 refills | Status: AC

## 2023-10-17 MED ORDER — LIDOCAINE HCL (PF) 1 % IJ SOLN
5.0000 mL | Freq: Once | INTRAMUSCULAR | Status: AC
  Administered 2023-10-17: 5 mL
  Filled 2023-10-17: qty 5

## 2023-10-17 NOTE — ED Provider Triage Note (Signed)
Emergency Medicine Provider Triage Evaluation Note  Misty Little , a 47 y.o. female  was evaluated in triage.  Pt complains of laceration to right 2nd toe. Cut it with an axe.  Review of Systems  Positive: Mild pain Negative: Numbness to toe  Physical Exam  BP 117/82 (BP Location: Right Arm)   Pulse 71   Temp 98.5 F (36.9 C) (Oral)   Resp 16   Ht 5' 1.75" (1.568 m)   Wt 97.1 kg   SpO2 99%   BMI 39.46 kg/m  Gen:   Awake, no distress   Resp:  Normal effort  MSK:   Moves extremities without difficulty  Other:  1.5 cm laceration to the top of the 2nd toe, not bleeding, cap refill normal, sensation intact  Medical Decision Making  Medically screening exam initiated at 5:54 PM.  Appropriate orders placed.  Misty Little was informed that the remainder of the evaluation will be completed by another provider, this initial triage assessment does not replace that evaluation, and the importance of remaining in the ED until their evaluation is complete.     Cameron Ali, PA-C 10/17/23 1756

## 2023-10-17 NOTE — ED Provider Notes (Signed)
[facility] Provider Note    Event Date/Time   First MD Initiated Contact with Patient 10/17/23 2057     (approximate)   History   Chief Complaint Laceration (Right foot, middle toe)   HPI  Misty Little is a 47 y.o. female with past medical history of Sturge-Weber syndrome who presents to the ED complaining of foot laceration.  Patient reports that just prior to arrival she was attempting to break down a chair with an ax when she missed and struck her right second toe.  She reports a laceration to the dorsum of the toe and has had pain with movement.  She is unsure of her last tetanus shot.     Physical Exam   Triage Vital Signs: ED Triage Vitals  Encounter Vitals Group     BP 10/17/23 1752 117/82     Systolic BP Percentile --      Diastolic BP Percentile --      Pulse Rate 10/17/23 1752 71     Resp 10/17/23 1752 16     Temp 10/17/23 1752 98.5 F (36.9 C)     Temp Source 10/17/23 1752 Oral     SpO2 10/17/23 1752 99 %     Weight 10/17/23 1753 214 lb (97.1 kg)     Height 10/17/23 1753 5' 1.75" (1.568 m)     Head Circumference --      Peak Flow --      Pain Score 10/17/23 1752 2     Pain Loc --      Pain Education --      Exclude from Growth Chart --     Most recent vital signs: Vitals:   10/17/23 1752 10/17/23 2211  BP: 117/82 132/89  Pulse: 71 61  Resp: 16 17  Temp: 98.5 F (36.9 C) 98.1 F (36.7 C)  SpO2: 99% 99%    Constitutional: Alert and oriented. Eyes: Conjunctivae are normal. Head: Atraumatic. Nose: No congestion/rhinnorhea. Mouth/Throat: Mucous membranes are moist.  Cardiovascular: Normal rate, regular rhythm. Grossly normal heart sounds.  2+ radial pulses bilaterally. Respiratory: Normal respiratory effort.  No retractions. Lungs CTAB. Gastrointestinal: Soft and nontender. No distention. Musculoskeletal: Diffuse tenderness to palpation of right second toe with no obvious deformity, 3 cm laceration over the dorsum of the  toe at the PIP. Neurologic:  Normal speech and language. No gross focal neurologic deficits are appreciated.    ED Results / Procedures / Treatments   Labs (all labs ordered are listed, but only abnormal results are displayed) Labs Reviewed - No data to display  RADIOLOGY Right foot x-ray reviewed and interpreted by me with fracture of the distal portion of the right proximal phalanx.  PROCEDURES:  Critical Care performed: No  ..Laceration Repair  Date/Time: 10/17/2023 11:44 PM  Performed by: Chesley Noon, MD Authorized by: Chesley Noon, MD   Consent:    Consent obtained:  Verbal   Consent given by:  Patient Universal protocol:    Patient identity confirmed:  Verbally with patient and arm band Anesthesia:    Anesthesia method:  Local infiltration   Local anesthetic:  Lidocaine 1% w/o epi Laceration details:    Location:  Toe   Toe location:  R second toe   Length (cm):  3 Pre-procedure details:    Preparation:  Patient was prepped and draped in usual sterile fashion and imaging obtained to evaluate for foreign bodies Exploration:    Limited defect created (wound extended): no  Hemostasis achieved with:  Direct pressure   Imaging obtained: x-ray     Imaging outcome: foreign body not noted     Wound exploration: wound explored through full range of motion and entire depth of wound visualized     Wound extent: areolar tissue not violated, fascia not violated, no foreign body, no signs of injury, no nerve damage, no tendon damage, no underlying fracture and no vascular damage     Contaminated: no   Treatment:    Area cleansed with:  Saline   Amount of cleaning:  Extensive   Irrigation solution:  Sterile saline   Irrigation method:  Pressure wash   Debridement:  None   Undermining:  None   Scar revision: no   Skin repair:    Repair method:  Sutures   Suture size:  4-0   Suture material:  Nylon   Suture technique:  Simple interrupted   Number of sutures:   2 Approximation:    Approximation:  Loose Repair type:    Repair type:  Simple Post-procedure details:    Dressing:  Non-adherent dressing   Procedure completion:  Tolerated well, no immediate complications    MEDICATIONS ORDERED IN ED: Medications  lidocaine (PF) (XYLOCAINE) 1 % injection 5 mL (5 mLs Infiltration Given by Other 10/17/23 2130)  Tdap (BOOSTRIX) injection 0.5 mL (0.5 mLs Intramuscular Given 10/17/23 2118)     IMPRESSION / MDM / ASSESSMENT AND PLAN / ED COURSE  I reviewed the triage vital signs and the nursing notes.                              47 y.o. female with past medical history of Sturge-Weber syndrome who presents to the ED complaining of injury to her right second toe after she struck it accidentally with an ax.  Patient's presentation is most consistent with acute complicated illness / injury requiring diagnostic workup.  Differential diagnosis includes, but is not limited to, fracture, dislocation, open fracture, tendon injury, vascular injury.  Patient well-appearing and in no acute distress, vital signs are unremarkable.  She has a laceration to her right second toe, x-ray shows fracture at the distal portion of the proximal phalanx just below this.  Findings discussed with Dr. Lilian Kapur of podiatry given open fracture, recommends washout with suture and outpatient follow-up.  Wound was washed with normal saline, patient's tetanus was updated, and we will discharge her on Keflex.  She was placed in a postop shoe, counseled to return to the ED for new or worsening symptoms.  Patient agrees with plan.      FINAL CLINICAL IMPRESSION(S) / ED DIAGNOSES   Final diagnoses:  Open nondisplaced fracture of proximal phalanx of lesser toe of right foot, initial encounter     Rx / DC Orders   ED Discharge Orders          Ordered    cephALEXin (KEFLEX) 500 MG capsule  4 times daily        10/17/23 2330             Note:  This document was prepared  using Dragon voice recognition software and may include unintentional dictation errors.   Chesley Noon, MD 10/17/23 2351

## 2023-10-17 NOTE — ED Triage Notes (Addendum)
Patient reports accidentally hitting her right foot, middle toe with axe. She was trying to take out some aggression. Reports she has had 1 tallboy of beer today.  Bleeding controlled

## 2023-11-08 ENCOUNTER — Ambulatory Visit (INDEPENDENT_AMBULATORY_CARE_PROVIDER_SITE_OTHER): Payer: No Typology Code available for payment source

## 2023-11-08 ENCOUNTER — Encounter: Payer: Self-pay | Admitting: Podiatry

## 2023-11-08 ENCOUNTER — Ambulatory Visit (INDEPENDENT_AMBULATORY_CARE_PROVIDER_SITE_OTHER): Payer: No Typology Code available for payment source | Admitting: Podiatry

## 2023-11-08 DIAGNOSIS — S92511B Displaced fracture of proximal phalanx of right lesser toe(s), initial encounter for open fracture: Secondary | ICD-10-CM

## 2023-11-08 DIAGNOSIS — S92911D Unspecified fracture of right toe(s), subsequent encounter for fracture with routine healing: Secondary | ICD-10-CM

## 2023-11-08 NOTE — Progress Notes (Signed)
  Subjective:  Patient ID: Misty Little, female    DOB: 07-Jan-1976,  MRN: [id number]  Chief Complaint  Patient presents with   Fracture    "I hit my toe with an axe."    Discussed the use of AI scribe software for clinical note transcription with the patient, who gave verbal consent to proceed.  History of Present Illness   The patient, with a history of a right second toe open fracture, presents for a follow-up visit. She sustained the injury three weeks ago while attempting to break down an old rocking chair for burning. She missed the chair and struck her toe instead. The wound was sutured and appears to be healing well. The patient reports no pain and has been comfortable wearing regular shoes. She has been managing well at home, with no signs of infection or complications.          Objective:    Physical Exam   MUSCULOSKELETAL: Well-healed laceration on dorsal aspect of second toe with sutures intact, no signs of infection, no pain to palpation, good range of motion in interphalangeal and metatarsophalangeal joints. SKIN: Appears healthy with no signs of infection.       No images are attached to the encounter.    Results   Procedure: Suture removal Description: Sutures were removed from the dorsal second toe. No signs of infection. No pain to palpation. Good range of motion of interphalangeal joint (IPJ) and metatarsophalangeal joint (MPJ).  Procedure: Toe splinting Description: The second toe was splinted to the adjacent toe using tape for support and stability.  RADIOLOGY Right foot radiographs: Intraarticular fracture with slight step off of head of proximal phalanx in proximal interphalangeal joint (PIPJ), no major arthritis changes, good bridging across the fracture site (11/08/2023)      Assessment:   1. Open displaced fracture of proximal phalanx of lesser toe of right foot, initial encounter      Plan:  Patient was evaluated and treated and all questions  answered.  Assessment and Plan    Open Fracture of Right Second Toe   The right second toe is healing well with no signs of infection. An X-ray revealed an intra-articular fracture with a slight step-off at the head of the proximal phalanx in the PIPJ, yet there is good bridging across the fracture site and no major arthritic changes at present. We discussed the potential for long-term arthritic changes and the possible need for arthroplasty if such changes occur. We will continue the current care and allow for further healing over the next 3-4 weeks. Sutures have been removed, and instructions on how to splint the toe for support and stability were provided. She should continue wearing her regular shoe as tolerated and follow up as needed if pain or arthritic changes develop.          Return if symptoms worsen or fail to improve.

## 2023-11-08 NOTE — Patient Instructions (Signed)
VISIT SUMMARY:  You visited [place] today for a follow-up on your right second toe open fracture. The injury occurred three weeks ago while you were breaking down an old rocking chair. The wound is healing well, and you are not experiencing any pain or complications.  YOUR PLAN:  -OPEN FRACTURE OF RIGHT SECOND TOE: An open fracture means the bone broke through the skin. Your X-ray shows a slight misalignment in the joint, but the bone is healing well. We discussed the possibility of long-term arthritis and the potential need for surgery if that occurs. For now, continue your current care, wear regular shoes as tolerated, and use the splint for support. Follow up if you experience pain or signs of arthritis.  INSTRUCTIONS:  Continue your current care and allow for further healing over the next 3-4 weeks. Follow up as needed if pain or arthritic changes develop.

## 2023-12-03 ENCOUNTER — Other Ambulatory Visit: Payer: Self-pay | Admitting: Family Medicine

## 2023-12-03 DIAGNOSIS — G43829 Menstrual migraine, not intractable, without status migrainosus: Secondary | ICD-10-CM

## 2023-12-03 DIAGNOSIS — Z3041 Encounter for surveillance of contraceptive pills: Secondary | ICD-10-CM

## 2023-12-03 DIAGNOSIS — F39 Unspecified mood [affective] disorder: Secondary | ICD-10-CM

## 2023-12-03 NOTE — Telephone Encounter (Signed)
 Medication Refill - Most Recent Primary Care Visit:  Provider: PAYNE, ELISE T  Department: [facility]  Visit Type: OFFICE VISIT  Date: 07/01/2023  Medication:   escitalopram  (LEXAPRO ) 10 MG tablet  busPIRone  (BUSPAR ) 7.5 MG tablet  norethindrone  (MICRONOR ) 0.35 MG tablet   Has the patient contacted their pharmacy?    [id number] [place], [place] - [address] Phone: [phone]  Fax: [phone]

## 2023-12-06 ENCOUNTER — Other Ambulatory Visit: Payer: Self-pay | Admitting: Family Medicine

## 2023-12-06 DIAGNOSIS — Z3041 Encounter for surveillance of contraceptive pills: Secondary | ICD-10-CM

## 2023-12-06 DIAGNOSIS — G43829 Menstrual migraine, not intractable, without status migrainosus: Secondary | ICD-10-CM

## 2023-12-06 NOTE — Telephone Encounter (Signed)
 Patient has upcoming appointment- passes protocol Requested Prescriptions  Pending Prescriptions Disp Refills   norethindrone  (MICRONOR ) 0.35 MG tablet [Pharmacy Med Name: NORETHINDRONE  0.35MG  TABLETS 28S] 84 tablet 0    Sig: TAKE 1 TABLET(0.35 MG) BY MOUTH DAILY     OB/GYN: Contraceptives - Progestins Passed - 12/06/2023 12:33 PM      Passed - Last BP in normal range    BP Readings from Last 1 Encounters:  10/17/23 132/89         Passed - Valid encounter within last 12 months    Recent Outpatient Visits           5 months ago Vertigo   [facility] Emilio Marseille T, FNP   1 year ago Depression, recurrent [facility])   [place] [facility] Emilio Marseille T, FNP   2 years ago Encounter for surveillance of contraceptive pills   [place] [facility] Emilio Marseille T, FNP   2 years ago Depression, unspecified depression type   [facility] Ashok Ferrier M, PA-C   3 years ago Other migraine without status migrainosus, not intractable   [facility] [place], [place], [place]       Future Appointments             In 2 weeks Wellington Curtis LABOR, FNP [facility], [facility]            Passed - Patient is not a smoker

## 2023-12-07 MED ORDER — BUSPIRONE HCL 7.5 MG PO TABS: 7.5000 mg | ORAL_TABLET | Freq: Two times a day (BID) | ORAL | 1 refills | Status: DC

## 2023-12-07 MED ORDER — ESCITALOPRAM OXALATE 10 MG PO TABS: ORAL_TABLET | ORAL | 0 refills | Status: DC

## 2023-12-07 NOTE — Telephone Encounter (Signed)
 Requested Prescriptions  Pending Prescriptions Disp Refills   escitalopram  (LEXAPRO ) 10 MG tablet 90 tablet 0    Sig: TAKE 1 TABLET(10 MG) BY MOUTH AT BEDTIME     Psychiatry:  Antidepressants - SSRI Passed - 12/07/2023  9:29 AM      Passed - Valid encounter within last 6 months    Recent Outpatient Visits           5 months ago Vertigo   [facility] Emilio Marseille T, FNP   1 year ago Depression, recurrent [facility])   [place] [facility] Emilio Marseille T, FNP   2 years ago Encounter for surveillance of contraceptive pills   [place] [facility] Emilio Marseille T, FNP   2 years ago Depression, unspecified depression type   [facility] [place], Adriana M, PA-C   3 years ago Other migraine without status migrainosus, not intractable   [place] [facility] [place] Kathrine HERO, [place]       Future Appointments             In 1 week Wellington Curtis LABOR, FNP [place] [facility], [facility]             norethindrone  (MICRONOR ) 0.35 MG tablet 84 tablet 4     OB/GYN: Contraceptives - Progestins Passed - 12/07/2023  9:29 AM      Passed - Last BP in normal range    BP Readings from Last 1 Encounters:  10/17/23 132/89         Passed - Valid encounter within last 12 months    Recent Outpatient Visits           5 months ago Vertigo   [place] Emilio Marseille T, FNP   1 year ago Depression, recurrent [facility])   [place] [facility] Emilio Marseille T, FNP   2 years ago Encounter for surveillance of contraceptive pills   [place] [facility] Emilio Marseille T, FNP   2 years ago Depression, unspecified depression type   [facility] [place], Adriana M, PA-C   3 years ago Other migraine without status migrainosus, not intractable   [place] [facility] [place], [place], [place]        Future Appointments             In 1 week Wellington Curtis LABOR, FNP [place] [facility]            Passed - Patient is not a smoker       busPIRone  (BUSPAR ) 7.5 MG tablet 180 tablet 1    Sig: Take 1 tablet (7.5 mg total) by mouth 2 (two) times daily.     Psychiatry: Anxiolytics/Hypnotics - Non-controlled Passed - 12/07/2023  9:29 AM      Passed - Valid encounter within last 12 months    Recent Outpatient Visits           5 months ago Vertigo   [facility] Emilio Marseille DASEN, FNP   1 year ago Depression, recurrent [facility])   [place] [facility] Emilio Marseille DASEN, FNP   2 years ago Encounter for surveillance of contraceptive pills   [place] [facility] Emilio Marseille T, FNP   2 years ago Depression, unspecified depression type   [facility] Ashok Kathrine M, PA-C   3 years ago  Other migraine without status migrainosus, not intractable   [place] [facility] [place], Kathrine HERO, [place]       Future Appointments             In 1 week Wellington Curtis LABOR, FNP [facility], [place]

## 2023-12-20 ENCOUNTER — Encounter: Payer: 59 | Admitting: Family Medicine

## 2023-12-27 ENCOUNTER — Telehealth: Payer: Self-pay | Admitting: Family Medicine

## 2023-12-27 NOTE — Telephone Encounter (Signed)
[facility] pharmacy is requesting refill escitalopram (LEXAPRO) 10 MG tablet  Please advise

## 2023-12-28 ENCOUNTER — Other Ambulatory Visit: Payer: Self-pay | Admitting: Family Medicine

## 2023-12-28 DIAGNOSIS — F39 Unspecified mood [affective] disorder: Secondary | ICD-10-CM

## 2023-12-28 MED ORDER — ESCITALOPRAM OXALATE 10 MG PO TABS: ORAL_TABLET | ORAL | 1 refills | Status: DC

## 2023-12-30 ENCOUNTER — Ambulatory Visit (INDEPENDENT_AMBULATORY_CARE_PROVIDER_SITE_OTHER): Payer: No Typology Code available for payment source | Admitting: Family Medicine

## 2023-12-30 ENCOUNTER — Encounter: Payer: Self-pay | Admitting: Family Medicine

## 2023-12-30 VITALS — BP 125/82 | HR 78 | Ht 61.0 in | Wt 223.1 lb

## 2023-12-30 DIAGNOSIS — Z6841 Body Mass Index (BMI) 40.0 and over, adult: Secondary | ICD-10-CM

## 2023-12-30 DIAGNOSIS — Z5320 Procedure and treatment not carried out because of patient's decision for unspecified reasons: Secondary | ICD-10-CM

## 2023-12-30 DIAGNOSIS — G43829 Menstrual migraine, not intractable, without status migrainosus: Secondary | ICD-10-CM | POA: Diagnosis not present

## 2023-12-30 DIAGNOSIS — Z0001 Encounter for general adult medical examination with abnormal findings: Secondary | ICD-10-CM | POA: Diagnosis not present

## 2023-12-30 DIAGNOSIS — Z0000 Encounter for general adult medical examination without abnormal findings: Secondary | ICD-10-CM | POA: Insufficient documentation

## 2023-12-30 DIAGNOSIS — Z1231 Encounter for screening mammogram for malignant neoplasm of breast: Secondary | ICD-10-CM

## 2023-12-30 DIAGNOSIS — F39 Unspecified mood [affective] disorder: Secondary | ICD-10-CM

## 2023-12-30 DIAGNOSIS — G479 Sleep disorder, unspecified: Secondary | ICD-10-CM

## 2023-12-30 DIAGNOSIS — Z3041 Encounter for surveillance of contraceptive pills: Secondary | ICD-10-CM | POA: Diagnosis not present

## 2023-12-30 MED ORDER — NORETHINDRONE 0.35 MG PO TABS: 1.0000 | ORAL_TABLET | Freq: Every day | ORAL | 3 refills | Status: DC

## 2023-12-30 NOTE — Assessment & Plan Note (Signed)
Chronic, stable Migraines every few months Using Micronor and Topamax for daily maintenance Using Sumatriptan for migraine abortifacient

## 2023-12-30 NOTE — Assessment & Plan Note (Signed)
Chronic, stable  Continues to have period Continue micronor to assist with migraines

## 2023-12-30 NOTE — Assessment & Plan Note (Signed)
BMI 42.15 Discussed importance of healthy weight management Discussed diet and exercise Encourage pt to use treadmill at home Small frequent meals, balanced diet, increase protein Exercise 150 minutes per week

## 2023-12-30 NOTE — Assessment & Plan Note (Signed)
Routine labs Screening for breast CA Pt decline colon CA screening until 48 years old - risks discussed Contraceptive pills reordered  Things to do to keep yourself healthy  - Exercise at least 30-45 minutes a day, 3-4 days a week.  - Eat a low-fat diet with lots of fruits and vegetables, up to 7-9 servings per day.  - Seatbelts can save your life. Wear them always.  - Smoke detectors on every level of your home, check batteries every year.  - Eye Doctor - have an eye exam every 1-2 years  - Safe sex - if you may be exposed to STDs, use a condom.  - Alcohol -  If you drink, do it moderately, less than 2 drinks per day.  - Health Care Power of Attorney. Choose someone to speak for you if you are not able.  - Depression is common in our stressful world.If you're feeling down or losing interest in things you normally enjoy, please come in for a visit.  - Violence - If anyone is threatening or hurting you, please call immediately.

## 2023-12-30 NOTE — Assessment & Plan Note (Signed)
Recent onset, possibly related to anxiety or premenopausal hormonal changes.  No current interventions in place. -Implement sleep hygiene measures including regular exercise, limiting screen time, and establishing a consistent bedtime. -Consider journaling to manage thoughts and concerns that may be contributing to insomnia. -Consider use of over-the-counter sleep aids such as melatonin or Unisom if needed.

## 2023-12-30 NOTE — Progress Notes (Signed)
Complete physical exam  Introduced to nurse practitioner role and practice setting.  All questions answered.  Discussed provider/patient relationship and expectations.   Patient: Misty Little   DOB: [birth year]   48 y.o. Female  MRN: [id number]  Subjective:    Chief Complaint  Patient presents with   Annual Exam    Diet: General  Sleeping: not sleeping good  Exercise: no Feeling: good    Misty Little is a 48 y.o. female who presents today for a complete physical exam. She reports consuming a general diet. The patient does not participate in regular exercise at present. She generally feels fairly well. She reports sleeping poorly. She does have additional problems to discuss today.   Migraines - managed  Last period - 1/18 to 1/26  Mood - She is currently taking Buspar 7.5 mg twice a day and Lexapro 10 mg daily for anxiety and depression. Her mood is stable.  She has difficulty sleeping, going to bed around 2 AM and having trouble falling asleep. She attributes this to not feeling tired and possibly having 'too much on her mind.' Her stepmother suggested it might be premenopausal symptoms. No use of sleep aids like melatonin or doxylamine.  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 She has a treadmill for under her desk but lacks motivation to use it regularly.  She is on birth control and her last menstrual period started on January 18 and ended on January 26. Her mother experienced early menopause. She has a family history of breast cancer, as her mother had it, which contributes to her anxiety about mammograms.  Most recent fall risk assessment:    07/01/2023    4:03 PM  Fall Risk   Falls in the past year? 0  Injury with Fall? 0  Risk for fall due to : No Fall Risks  Follow up Falls evaluation completed     Most recent depression screenings:    12/30/2023    8:32 AM 07/01/2023    4:03 PM  PHQ 2/9 Scores  PHQ - 2  Score 0 2  PHQ- 9 Score 4 6      12/30/2023    8:33 AM 05/09/2020    9:44 AM  GAD 7 : Generalized Anxiety Score  Nervous, Anxious, on Edge 0 0  Control/stop worrying 0 0  Worry too much - different things 0 0  Trouble relaxing 0 1  Restless 0 0  Easily annoyed or irritable 0 0  Afraid - awful might happen 0 0  Total GAD 7 Score 0 1  Anxiety Difficulty Not difficult at all Not difficult at all    Vision:Within last year and Dental: No current dental problems and Current dental problems  Patient Active Problem List   Diagnosis Date Noted   Annual physical exam 12/30/2023   Sleep difficulties 12/30/2023   Vertigo 07/01/2023   Hyponatremia 07/01/2023   Elevated glucose 07/01/2023   Screening mammogram for breast cancer 07/01/2023   Encounter for hepatitis C screening test for low risk patient 07/01/2023   Mood disorder (HCC) 07/01/2023   Colon cancer screening declined 06/03/2022   Migraine 06/03/2022   Exercise-induced asthma 09/19/2021   Encounter for surveillance of contraceptive pills 09/19/2021   Class 3 severe obesity without serious comorbidity with body mass index (BMI) of 40.0 to 44.9 in adult [facility]) 04/03/2014   Sturge-Weber syndrome (HCC) 02/07/2014   Past Medical History:  Diagnosis Date   Anemia    Headache(784.0)    Infection    uti   Menstrual migraine    Seizures (HCC)    "small ones as child"   Sturge-Weber syndrome with glaucoma [facility])    Past Surgical History:  Procedure Laterality Date   BARIATRIC SURGERY  2021   CESAREAN SECTION N/A 09/02/2014   Procedure: CESAREAN SECTION;  Surgeon: Catalina Antigua, MD;  Location: [facility];  Service: Obstetrics;  Laterality: N/A;   EYE SURGERY  1977   EYE SURGERY Right 2010   glaucoma   TOE SURGERY  1977   Social History   Tobacco Use   Smoking status: Never   Smokeless tobacco: Never  Vaping Use   Vaping status: Never Used  Substance Use Topics   Alcohol use: Yes    Comment: socially   Drug use: No    Allergies  Allergen Reactions   Black Walnut Pollen Allergy Skin Test     Other reaction(s): Unknown Other reaction(s): Other (See Comments) Mouth sores   Nsaids    Other Other (See Comments)    Pt states that she is allergic to walnuts- Reaction:  Causes tongue to hurt.       Patient Care Team: Sallee Provencal, FNP as PCP - General (Family Medicine)   Outpatient Medications Prior to Visit  Medication Sig   albuterol (VENTOLIN HFA) 108 (90 Base) MCG/ACT inhaler Inhale 2 puffs into the lungs every 6 (six) hours as needed for wheezing or shortness of breath.   busPIRone (BUSPAR) 7.5 MG tablet Take 1 tablet (7.5 mg total) by mouth 2 (two) times daily.   cyclobenzaprine (FLEXERIL) 5 MG tablet Take 1 tablet (5 mg total) by mouth 3 (three) times daily as needed (muscle pain).   escitalopram (LEXAPRO) 10 MG tablet TAKE 1 TABLET(10 MG) BY MOUTH AT BEDTIME   fluticasone (FLONASE) 50 MCG/ACT nasal spray Place 2 sprays into both nostrils daily.   loratadine (CLARITIN) 10 MG tablet Take by mouth.   SUMAtriptan (IMITREX) 100 MG tablet May repeat in 2 hours if headache persists or recurs.   topiramate (TOPAMAX) 100 MG tablet Take 1 tablet (100 mg total) by mouth daily.   [DISCONTINUED] norethindrone (MICRONOR) 0.35 MG tablet TAKE 1 TABLET(0.35 MG) BY MOUTH DAILY   [DISCONTINUED] meclizine (ANTIVERT) 25 MG tablet Take 1 tablet (25 mg total) by mouth 3 (three) times daily as needed for dizziness. (Patient not taking: Reported on 12/30/2023)   No facility-administered medications prior to visit.    Review of Systems  All other systems reviewed and are negative.     Objective:     BP 125/82   Pulse 78   Ht 5\' 1"  (1.549 m)   Wt 223 lb 1.6 oz (101.2 kg)   SpO2 99%   BMI 42.15 kg/m  BP Readings from Last 3 Encounters:  12/30/23 125/82  10/17/23 132/89  07/01/23 (!) 113/50   Wt Readings from Last 3 Encounters:  12/30/23 223 lb 1.6 oz (101.2 kg)  10/17/23 214 lb (97.1 kg)  07/01/23  212 lb 1.6 oz (96.2 kg)      Physical Exam Constitutional:      General: She is not in acute distress.    Appearance: Normal appearance. She is obese. She is not ill-appearing.  HENT:     Head: Normocephalic.     Right Ear: Tympanic membrane normal.     Left Ear: Tympanic membrane normal.     Nose: Nose normal.  Mouth/Throat:     Mouth: Mucous membranes are moist.     Pharynx: Oropharynx is clear. No oropharyngeal exudate or posterior oropharyngeal erythema.  Eyes:     General: Lids are normal.     Extraocular Movements: Extraocular movements intact.     Right eye: Normal extraocular motion.     Left eye: Normal extraocular motion.     Conjunctiva/sclera: Conjunctivae normal.     Right eye: Right conjunctiva is not injected.     Left eye: Left conjunctiva is not injected.     Pupils: Pupils are equal, round, and reactive to light.  Neck:     Thyroid: No thyroid mass, thyromegaly or thyroid tenderness.  Cardiovascular:     Rate and Rhythm: Normal rate and regular rhythm.     Pulses: Normal pulses.          Radial pulses are 2+ on the right side and 2+ on the left side.       Posterior tibial pulses are 2+ on the right side and 2+ on the left side.     Heart sounds: Normal heart sounds, S1 normal and S2 normal. No murmur heard.    No friction rub. No gallop.  Pulmonary:     Effort: Pulmonary effort is normal. No respiratory distress.     Breath sounds: Normal breath sounds. No stridor. No wheezing, rhonchi or rales.  Chest:     Chest wall: No tenderness.     Comments: Declines breast exam today Abdominal:     General: Abdomen is protuberant. Bowel sounds are normal. There is no distension.     Palpations: Abdomen is soft. There is no mass.     Tenderness: There is no abdominal tenderness. There is no right CVA tenderness, left CVA tenderness, guarding or rebound.     Hernia: No hernia is present.  Genitourinary:    Comments: UTD on pelvic, no concerns  today Musculoskeletal:        General: No swelling or tenderness. Normal range of motion.     Cervical back: Normal range of motion. No rigidity.  Lymphadenopathy:     Cervical: No cervical adenopathy.     Right cervical: No superficial, deep or posterior cervical adenopathy.    Left cervical: No superficial, deep or posterior cervical adenopathy.  Skin:    General: Skin is warm and dry.     Capillary Refill: Capillary refill takes less than 2 seconds.     Findings: No bruising or erythema.  Neurological:     General: No focal deficit present.     Mental Status: She is alert and oriented to person, place, and time. Mental status is at baseline.     GCS: GCS eye subscore is 4. GCS verbal subscore is 5. GCS motor subscore is 6.     Cranial Nerves: No cranial nerve deficit.     Sensory: No sensory deficit.     Motor: No weakness, tremor or pronator drift.     Coordination: Romberg sign negative.     Gait: Gait is intact. Gait normal.  Psychiatric:        Attention and Perception: Attention and perception normal.        Mood and Affect: Mood normal. Affect is tearful.        Speech: Speech normal.        Behavior: Behavior normal. Behavior is cooperative.        Thought Content: Thought content normal.        Cognition  and Memory: Cognition and memory normal.        Judgment: Judgment normal.     No results found for any visits on 12/30/23.     Assessment & Plan:    Routine Health Maintenance and Physical Exam  Health Maintenance  Topic Date Due   Pneumococcal Vaccination (1 of 2 - PCV) Never done   COVID-19 Vaccine (2 - Janssen risk series) 04/06/2020   Colon Cancer Screening  Never done   Flu Shot  02/28/2024*   Pap with HPV screening  07/03/2025   DTaP/Tdap/Td vaccine (3 - Td or Tdap) 10/16/2033   Hepatitis C Screening  Completed   HIV Screening  Completed   HPV Vaccine  Aged Out  *Topic was postponed. The date shown is not the original due date.    Discussed health  benefits of physical activity, and encouraged her to engage in regular exercise appropriate for her age and condition.  Annual physical exam Assessment & Plan: Routine labs Screening for breast CA Pt decline colon CA screening until 48 years old - risks discussed Contraceptive pills reordered  Things to do to keep yourself healthy  - Exercise at least 30-45 minutes a day, 3-4 days a week.  - Eat a low-fat diet with lots of fruits and vegetables, up to 7-9 servings per day.  - Seatbelts can save your life. Wear them always.  - Smoke detectors on every level of your home, check batteries every year.  - Eye Doctor - have an eye exam every 1-2 years  - Safe sex - if you may be exposed to STDs, use a condom.  - Alcohol -  If you drink, do it moderately, less than 2 drinks per day.  - Health Care Power of Attorney. Choose someone to speak for you if you are not able.  - Depression is common in our stressful world.If you're feeling down or losing interest in things you normally enjoy, please come in for a visit.  - Violence - If anyone is threatening or hurting you, please call immediately.   Orders: -     CBC -     TSH -     Lipid panel -     Comprehensive metabolic panel  Encounter for surveillance of contraceptive pills Assessment & Plan: Chronic, stable  Continues to have period Continue micronor to assist with migraines     Orders: -     Norethindrone; Take 1 tablet (0.35 mg total) by mouth daily.  Dispense: 84 tablet; Refill: 3  Menstrual migraine without status migrainosus, not intractable Assessment & Plan: Chronic, stable Migraines every few months Using Micronor and Topamax for daily maintenance Using Sumatriptan for migraine abortifacient   Orders: -     Norethindrone; Take 1 tablet (0.35 mg total) by mouth daily.  Dispense: 84 tablet; Refill: 3  Class 3 severe obesity without serious comorbidity with body mass index (BMI) of 40.0 to 44.9 in adult, unspecified  obesity type (HCC) Assessment & Plan: BMI 42.15 Discussed importance of healthy weight management Discussed diet and exercise Encourage pt to use treadmill at home Small frequent meals, balanced diet, increase protein Exercise 150 minutes per week      Orders: -     Hemoglobin A1c  Screening mammogram for breast cancer Assessment & Plan: PT very hesitant and nervous to get mammogram Mother had breast cancer Pt afraid of results Previous screening pt ending up cancelling due to anxiety Discussed bring friend or family with her Discussed make  use one time antianxiety medication if needed for get through imaging.  Pt agreeable and will schedule   Orders: -     3D Screening Mammogram, Left and Right; Future  Colon cancer screening declined Assessment & Plan: Risk and benefits discussed Pt wants to wait until 2027   Mood disorder (HCC) Assessment & Plan: Continue Buspar 7.5mg  BID and Lexapro 10mg  dialy Pt happy with medicaiton reigmen No concerns today   Sleep difficulties Assessment & Plan: Recent onset, possibly related to anxiety or premenopausal hormonal changes.  No current interventions in place. -Implement sleep hygiene measures including regular exercise, limiting screen time, and establishing a consistent bedtime. -Consider journaling to manage thoughts and concerns that may be contributing to insomnia. -Consider use of over-the-counter sleep aids such as melatonin or Unisom if needed.     Return in about 6 months (around 06/28/2024) for general follow up,; mood.    Sallee Provencal, FNP, have reviewed all documentation for this visit. The documentation on 12/30/23 for the exam, diagnosis, procedures, and orders are all accurate and complete.   Sallee Provencal, FNP

## 2023-12-30 NOTE — Assessment & Plan Note (Signed)
Risk and benefits discussed Pt wants to wait until 2027

## 2023-12-30 NOTE — Assessment & Plan Note (Signed)
PT very hesitant and nervous to get mammogram Mother had breast cancer Pt afraid of results Previous screening pt ending up cancelling due to anxiety Discussed bring friend or family with her Discussed make use one time antianxiety medication if needed for get through imaging.  Pt agreeable and will schedule

## 2023-12-30 NOTE — Assessment & Plan Note (Signed)
Continue Buspar 7.5mg  BID and Lexapro 10mg  dialy Pt happy with medicaiton reigmen No concerns today

## 2023-12-31 ENCOUNTER — Encounter: Payer: Self-pay | Admitting: Family Medicine

## 2023-12-31 LAB — COMPREHENSIVE METABOLIC PANEL
ALT: 10 [IU]/L (ref 0–32)
AST: 20 [IU]/L (ref 0–40)
Albumin: 3.9 g/dL (ref 3.9–4.9)
Alkaline Phosphatase: 65 [IU]/L (ref 44–121)
BUN/Creatinine Ratio: 23 (ref 9–23)
BUN: 18 mg/dL (ref 6–24)
Bilirubin Total: 0.4 mg/dL (ref 0.0–1.2)
CO2: 20 mmol/L (ref 20–29)
Calcium: 8.6 mg/dL — ABNORMAL LOW (ref 8.7–10.2)
Chloride: 107 mmol/L — ABNORMAL HIGH (ref 96–106)
Creatinine, Ser: 0.8 mg/dL (ref 0.57–1.00)
Globulin, Total: 2.4 g/dL (ref 1.5–4.5)
Glucose: 85 mg/dL (ref 70–99)
Potassium: 4.2 mmol/L (ref 3.5–5.2)
Sodium: 140 mmol/L (ref 134–144)
Total Protein: 6.3 g/dL (ref 6.0–8.5)
eGFR: 91 mL/min/{1.73_m2} (ref >59)

## 2023-12-31 LAB — LIPID PANEL
Chol/HDL Ratio: 3 {ratio} (ref 0.0–4.4)
Cholesterol, Total: 186 mg/dL (ref 100–199)
HDL: 61 mg/dL (ref >39)
LDL Chol Calc (NIH): 104 mg/dL — ABNORMAL HIGH (ref 0–99)
Triglycerides: 120 mg/dL (ref 0–149)
VLDL Cholesterol Cal: 21 mg/dL (ref 5–40)

## 2023-12-31 LAB — HEMOGLOBIN A1C
Est. average glucose Bld gHb Est-mCnc: 100 mg/dL
Hgb A1c MFr Bld: 5.1 % (ref 4.8–5.6)

## 2023-12-31 LAB — CBC
Hematocrit: 42.1 % (ref 34.0–46.6)
Hemoglobin: 13.6 g/dL (ref 11.1–15.9)
MCH: 32.3 pg (ref 26.6–33.0)
MCHC: 32.3 g/dL (ref 31.5–35.7)
MCV: 100 fL — ABNORMAL HIGH (ref 79–97)
Platelets: 228 10*3/uL (ref 150–450)
RBC: 4.21 x10E6/uL (ref 3.77–5.28)
RDW: 12.4 % (ref 11.7–15.4)
WBC: 4.8 10*3/uL (ref 3.4–10.8)

## 2023-12-31 LAB — TSH: TSH: 3.06 u[IU]/mL (ref 0.450–4.500)

## 2024-01-11 ENCOUNTER — Other Ambulatory Visit (HOSPITAL_COMMUNITY): Payer: Self-pay | Admitting: Family Medicine

## 2024-01-11 DIAGNOSIS — Z1231 Encounter for screening mammogram for malignant neoplasm of breast: Secondary | ICD-10-CM

## 2024-01-19 ENCOUNTER — Other Ambulatory Visit: Payer: Self-pay | Admitting: Family Medicine

## 2024-01-19 DIAGNOSIS — F418 Other specified anxiety disorders: Secondary | ICD-10-CM

## 2024-01-19 MED ORDER — LORAZEPAM 0.5 MG PO TABS: ORAL_TABLET | ORAL | 0 refills | Status: DC

## 2024-01-21 ENCOUNTER — Ambulatory Visit (HOSPITAL_COMMUNITY)
Admission: RE | Admit: 2024-01-21 | Discharge: 2024-01-21 | Disposition: A | Discharge status: Home / Self Care | Payer: No Typology Code available for payment source | Source: Ambulatory Visit | Attending: Family Medicine | Admitting: Family Medicine

## 2024-01-21 DIAGNOSIS — Z1231 Encounter for screening mammogram for malignant neoplasm of breast: Secondary | ICD-10-CM | POA: Diagnosis present

## 2024-01-26 ENCOUNTER — Encounter: Payer: Self-pay | Admitting: Family Medicine

## 2024-06-01 ENCOUNTER — Other Ambulatory Visit: Payer: Self-pay | Admitting: Family Medicine

## 2024-06-01 DIAGNOSIS — F39 Unspecified mood [affective] disorder: Secondary | ICD-10-CM

## 2024-06-05 ENCOUNTER — Other Ambulatory Visit: Payer: Self-pay | Admitting: Family Medicine

## 2024-06-05 DIAGNOSIS — G43809 Other migraine, not intractable, without status migrainosus: Secondary | ICD-10-CM

## 2024-06-05 NOTE — Telephone Encounter (Signed)
 Requested Prescriptions  Pending Prescriptions Disp Refills   busPIRone  (BUSPAR ) 7.5 MG tablet [Pharmacy Med Name: BUSPIRONE  HCL 7.5 MG TABLET] 180 tablet 1    Sig: TAKE 1 TABLET BY MOUTH 2 TIMES DAILY.     Psychiatry: Anxiolytics/Hypnotics - Non-controlled Failed - 06/05/2024 12:42 PM      Failed - Valid encounter within last 12 months    Recent Outpatient Visits   None     Future Appointments             In 3 weeks Wellington Curtis LABOR, FNP [facility]

## 2024-06-19 ENCOUNTER — Encounter: Payer: Self-pay | Admitting: Family Medicine

## 2024-06-28 ENCOUNTER — Ambulatory Visit: Payer: Self-pay | Admitting: Family Medicine

## 2024-06-28 ENCOUNTER — Encounter: Payer: Self-pay | Admitting: Family Medicine

## 2024-06-28 VITALS — BP 110/74 | HR 58 | Resp 16 | Ht 61.75 in | Wt 233.2 lb

## 2024-06-28 DIAGNOSIS — R200 Anesthesia of skin: Secondary | ICD-10-CM | POA: Diagnosis not present

## 2024-06-28 DIAGNOSIS — H9201 Otalgia, right ear: Secondary | ICD-10-CM

## 2024-06-28 DIAGNOSIS — F39 Unspecified mood [affective] disorder: Secondary | ICD-10-CM

## 2024-06-28 DIAGNOSIS — M25371 Other instability, right ankle: Secondary | ICD-10-CM

## 2024-06-28 DIAGNOSIS — E66813 Obesity, class 3: Secondary | ICD-10-CM

## 2024-06-28 DIAGNOSIS — Z6841 Body Mass Index (BMI) 40.0 and over, adult: Secondary | ICD-10-CM

## 2024-06-28 DIAGNOSIS — R202 Paresthesia of skin: Secondary | ICD-10-CM

## 2024-06-28 DIAGNOSIS — G43809 Other migraine, not intractable, without status migrainosus: Secondary | ICD-10-CM

## 2024-06-28 MED ORDER — ESCITALOPRAM OXALATE 20 MG PO TABS: 20.0000 mg | ORAL_TABLET | Freq: Every day | ORAL | 1 refills | Status: DC

## 2024-06-28 NOTE — Progress Notes (Signed)
 Established Patient Office Visit  Introduced to nurse practitioner role and practice setting.  All questions answered.  Discussed provider/patient relationship and expectations.  Subjective   Patient ID: Misty Little, female    DOB: 1975/12/23  Age: 48 y.o. MRN: [id number]  Chief Complaint  Patient presents with   Medical Management of Chronic Issues    6 months follow-up Depression   Discussed the use of AI scribe software for clinical note transcription with the patient, who gave verbal consent to proceed.  History of Present Illness Misty Little is a 48 year old female who presents for management for mood with new concerns for numbness and tingling in her toes.  Neuropathy bilateral feet - Intermittent numbness and tingling in the toes without a specific onset date - Symptoms occur randomly and are not associated with prolonged sitting or standing - No associated weakness or impact on ambulation, though foot occasionally gives way - History of walking on tiptoes as a child - Recent minor trauma to the foot while chopping something, without wounds or signs of athlete's foot  Fatigue - Frequent fatigue attributed to stress related to her mother's terminal leukemia and chemotherapy  Mood disturbance - Depressed mood - Currently taking Lexapro  10 mg daily and Buspar  for anxiety and depression  Weight status and physical activity - Weight stable since last visit - Attempts physical activities such as kickboxing, but schedule and foot symptoms limit participation  Otalgia - Occasional ear pain         06/28/2024    8:08 AM 12/30/2023    8:32 AM 07/01/2023    4:03 PM  Depression screen PHQ 2/9  Decreased Interest 3 0 1  Down, Depressed, Hopeless 0 0 1  PHQ - 2 Score 3 0 2  Altered sleeping 3 2 1   Tired, decreased energy 3 2 1   Change in appetite 0 0 1  Feeling bad or failure about yourself  2 0 1  Trouble concentrating 0 0 0  Moving slowly or fidgety/restless 0 0 0   Suicidal thoughts 0 0 0  PHQ-9 Score 11 4 6   Difficult doing work/chores Not difficult at all Not difficult at all Not difficult at all       06/28/2024    8:12 AM 12/30/2023    8:33 AM 05/09/2020    9:44 AM  GAD 7 : Generalized Anxiety Score  Nervous, Anxious, on Edge 0 0 0  Control/stop worrying 3 0 0  Worry too much - different things 2 0 0  Trouble relaxing 2 0 1  Restless 3 0 0  Easily annoyed or irritable 3 0 0  Afraid - awful might happen 0 0 0  Total GAD 7 Score 13 0 1  Anxiety Difficulty Not difficult at all Not difficult at all Not difficult at all     Review of Systems  All other systems reviewed and are negative.   Negative unless indicated in HPI   Objective:     BP 110/74 (BP Location: Left Arm, Patient Position: Sitting, Cuff Size: Large)   Pulse (!) 58   Resp 16   Ht 5' 1.75 (1.568 m)   Wt 233 lb 3.2 oz (105.8 kg)   SpO2 100%   BMI 43.00 kg/m    Physical Exam Constitutional:      General: She is not in acute distress.    Appearance: Normal appearance. She is obese. She is not toxic-appearing or diaphoretic.  HENT:  Head: Normocephalic.     Right Ear: A middle ear effusion is present.     Left Ear: Tympanic membrane normal.     Nose: Nose normal.     Mouth/Throat:     Mouth: Mucous membranes are moist.     Pharynx: Oropharynx is clear.  Eyes:     Extraocular Movements: Extraocular movements intact.     Pupils: Pupils are equal, round, and reactive to light.  Neck:     Vascular: No carotid bruit.  Cardiovascular:     Rate and Rhythm: Normal rate and regular rhythm.     Pulses: Normal pulses.          Dorsalis pedis pulses are 2+ on the right side and 2+ on the left side.       Posterior tibial pulses are 2+ on the right side and 2+ on the left side.     Heart sounds: Normal heart sounds. No murmur heard.    No friction rub. No gallop.  Pulmonary:     Effort: No respiratory distress.     Breath sounds: No stridor. No wheezing, rhonchi  or rales.  Chest:     Chest wall: No tenderness.  Musculoskeletal:     Cervical back: No rigidity or tenderness.     Right lower leg: No edema.     Left lower leg: No edema.     Right foot: Normal range of motion. No deformity, bunion, Charcot foot, foot drop or prominent metatarsal heads.     Left foot: Normal range of motion. No deformity, bunion, Charcot foot, foot drop or prominent metatarsal heads.  Feet:     Right foot:     Protective Sensation: 10 sites tested.  10 sites sensed.     Skin integrity: Skin integrity normal.     Toenail Condition: Right toenails are normal.     Left foot:     Protective Sensation: 10 sites tested.  10 sites sensed.     Skin integrity: Skin integrity normal.     Toenail Condition: Left toenails are normal.  Lymphadenopathy:     Cervical: No cervical adenopathy.     Right cervical: No superficial, deep or posterior cervical adenopathy.    Left cervical: No superficial, deep or posterior cervical adenopathy.  Skin:    General: Skin is warm and dry.     Capillary Refill: Capillary refill takes less than 2 seconds.  Neurological:     General: No focal deficit present.     Mental Status: She is alert and oriented to person, place, and time. Mental status is at baseline.     Cranial Nerves: No cranial nerve deficit.     Sensory: No sensory deficit.     Motor: No weakness.     Coordination: Coordination normal.     Gait: Gait normal.  Psychiatric:        Mood and Affect: Mood normal.        Behavior: Behavior normal.        Thought Content: Thought content normal.        Judgment: Judgment normal.      No results found for any visits on 06/28/24.    The 10-year ASCVD risk score (Arnett DK, et al., 2019) is: 0.6%    Assessment & Plan:  Numbness and tingling of both feet -     TSH -     Hemoglobin A1c -     Vitamin B12 -     VITAMIN D   25 Hydroxy (Vit-D Deficiency, Fractures) -     Comprehensive metabolic panel with GFR -      CBC  Class 3 severe obesity without serious comorbidity with body mass index (BMI) of 40.0 to 44.9 in adult -     Lipid panel  Unstable right ankle -     Ambulatory referral to Physical Therapy  Otalgia of right ear  Mood disorder (HCC)  Other migraine without status migrainosus, not intractable  Other orders -     Escitalopram  Oxalate; Take 1 tablet (20 mg total) by mouth daily.  Dispense: 90 tablet; Refill: 1     Assessment and Plan Assessment & Plan Depression/Anxiety Depression worsened by stress from mother's terminal illness. Current medications include Lexapro  and Buspar . Discussed increasing Lexapro  to 20 mg for better management of depression and anxiety.  Worsening PHQ9 and GAD7 - Increase Lexapro  to 20 mg daily. - Continue buspar  7.73m daily  Migraines - Chronic, stable - continue topiramate  100mg  daily - continue prn Sumatriptan  100mg  for migraine attack  Numbness and tingling of toes Intermittent numbness and tingling in toes. Differential includes vitamin deficiency, peripheral neuropathy, vascular issues, or compression. Normal sensation testing. No wounds or athlete's foot. Foot instability may contribute. - no weakness, decrease in ROM - Labs: CMP, TSH, CBC, Vitamin b12 and d, A1c - Consider ultrasound if lab results are normal to assess for vascular issues. - Consider low dose gabapentin  if neuropathy is confirmed and labs are normal.  Foot instability with gait abnormality Foot instability with history of tiptoe walking. Possible improper footwear. Discussed physical therapy and proper footwear assessment. - Refer to physical therapy for assessment and strengthening exercises. - Recommend visiting [facility] for gait analysis and proper shoe fitting.  BMI = 43.00 Weight management hindered by stress and lifestyle factors.  Discussed lifestyle modifications. Foot instability affects exercise efforts. - Encourage lifestyle management including exercise  and diet. - Review insurance coverage for potential weight management medications. -Continue to make conscious decisions for well balanced diet smaller portions with increase protein, fruits, veggies, water as drink of choice, decrease starches, processed foods, and saturated fats.k.   Ear pain Intermittent ear pain with no visible abnormalities. Possible fluid in sinus cavity. - No jaw pain, hearing changes, no swelling, erythema present. No enlarged lymph nodes, No cerumen impaction - TM normal, mild effusion, clear fluid - most likely eustachian tube issues - Recommend daily use of Flonase  to reduce fluid in the sinus cavity. - Referral to ENT if continues   Return in about 6 months (around 12/29/2024) for Mood .   I, Curtis DELENA Boom, FNP, have reviewed all documentation for this visit. The documentation on 06/28/24 for the exam, diagnosis, procedures, and orders are all accurate and complete.   Curtis DELENA Boom, FNP

## 2024-06-29 ENCOUNTER — Ambulatory Visit: Payer: Self-pay | Admitting: Family Medicine

## 2024-06-29 LAB — COMPREHENSIVE METABOLIC PANEL WITH GFR
ALT: 10 IU/L (ref 0–32)
AST: 21 IU/L (ref 0–40)
Albumin: 4.1 g/dL (ref 3.9–4.9)
Alkaline Phosphatase: 62 IU/L (ref 44–121)
BUN/Creatinine Ratio: 20 (ref 9–23)
BUN: 16 mg/dL (ref 6–24)
Bilirubin Total: 0.3 mg/dL (ref 0.0–1.2)
CO2: 18 mmol/L — ABNORMAL LOW (ref 20–29)
Calcium: 9 mg/dL (ref 8.7–10.2)
Chloride: 109 mmol/L — ABNORMAL HIGH (ref 96–106)
Creatinine, Ser: 0.81 mg/dL (ref 0.57–1.00)
Globulin, Total: 2.3 g/dL (ref 1.5–4.5)
Glucose: 90 mg/dL (ref 70–99)
Potassium: 4.2 mmol/L (ref 3.5–5.2)
Sodium: 138 mmol/L (ref 134–144)
Total Protein: 6.4 g/dL (ref 6.0–8.5)
eGFR: 89 mL/min/1.73 (ref >59)

## 2024-06-29 LAB — VITAMIN D 25 HYDROXY (VIT D DEFICIENCY, FRACTURES): Vit D, 25-Hydroxy: 33.5 ng/mL (ref 30.0–100.0)

## 2024-06-29 LAB — LIPID PANEL
Chol/HDL Ratio: 3.4 ratio (ref 0.0–4.4)
Cholesterol, Total: 186 mg/dL (ref 100–199)
HDL: 55 mg/dL (ref >39)
LDL Chol Calc (NIH): 110 mg/dL — ABNORMAL HIGH (ref 0–99)
Triglycerides: 120 mg/dL (ref 0–149)
VLDL Cholesterol Cal: 21 mg/dL (ref 5–40)

## 2024-06-29 LAB — CBC
Hematocrit: 41.9 % (ref 34.0–46.6)
Hemoglobin: 13.4 g/dL (ref 11.1–15.9)
MCH: 32.4 pg (ref 26.6–33.0)
MCHC: 32 g/dL (ref 31.5–35.7)
MCV: 101 fL — ABNORMAL HIGH (ref 79–97)
Platelets: 206 x10E3/uL (ref 150–450)
RBC: 4.14 x10E6/uL (ref 3.77–5.28)
RDW: 12.7 % (ref 11.7–15.4)
WBC: 4.1 x10E3/uL (ref 3.4–10.8)

## 2024-06-29 LAB — VITAMIN B12: Vitamin B-12: 1386 pg/mL — ABNORMAL HIGH (ref 232–1245)

## 2024-06-29 LAB — TSH: TSH: 2.85 u[IU]/mL (ref 0.450–4.500)

## 2024-06-29 LAB — HEMOGLOBIN A1C
Est. average glucose Bld gHb Est-mCnc: 103 mg/dL
Hgb A1c MFr Bld: 5.2 % (ref 4.8–5.6)

## 2024-12-01 ENCOUNTER — Other Ambulatory Visit: Payer: Self-pay

## 2024-12-01 ENCOUNTER — Telehealth: Payer: Self-pay | Admitting: Family Medicine

## 2024-12-01 DIAGNOSIS — G43829 Menstrual migraine, not intractable, without status migrainosus: Secondary | ICD-10-CM

## 2024-12-01 DIAGNOSIS — Z3041 Encounter for surveillance of contraceptive pills: Secondary | ICD-10-CM

## 2024-12-01 NOTE — Telephone Encounter (Signed)
[facility] faxed refill request for the following medications:  norethindrone (MICRONOR) 0.35 MG tablet   Please advise.

## 2024-12-05 ENCOUNTER — Other Ambulatory Visit: Payer: Self-pay

## 2024-12-05 ENCOUNTER — Telehealth: Payer: Self-pay | Admitting: Family Medicine

## 2024-12-05 DIAGNOSIS — G43829 Menstrual migraine, not intractable, without status migrainosus: Secondary | ICD-10-CM

## 2024-12-05 DIAGNOSIS — Z3041 Encounter for surveillance of contraceptive pills: Secondary | ICD-10-CM

## 2024-12-05 MED ORDER — NORETHINDRONE 0.35 MG PO TABS: 1.0000 | ORAL_TABLET | Freq: Every day | ORAL | 3 refills | Status: AC

## 2024-12-05 NOTE — Telephone Encounter (Signed)
 Refill Request   Pharm[facility]: [facility]  Medication: norethindrone  (MICRONOR ) 0.35 MG tablet   Quantity (if provided): 84  Please send in refill request.

## 2024-12-11 ENCOUNTER — Telehealth: Payer: Self-pay | Admitting: Family Medicine

## 2024-12-11 ENCOUNTER — Other Ambulatory Visit: Payer: Self-pay

## 2024-12-11 DIAGNOSIS — G43809 Other migraine, not intractable, without status migrainosus: Secondary | ICD-10-CM

## 2024-12-11 DIAGNOSIS — F39 Unspecified mood [affective] disorder: Secondary | ICD-10-CM

## 2024-12-11 NOTE — Telephone Encounter (Signed)
 Converted

## 2024-12-11 NOTE — Telephone Encounter (Signed)
 LOV 06/28/24 NOV 12/29/24 with you LRF 06/05/24 180 x 1 Buspirone  LRF 06/28/24 90 x 1 Escitalopram  LRF 06/05/24 90 x 1 Toprimate

## 2024-12-11 NOTE — Telephone Encounter (Signed)
 [facility] faxed refill request for the following medications:   busPIRone  (BUSPAR ) 7.5 MG tablet     topiramate  (TOPAMAX ) 100 MG tablet    escitalopram  (LEXAPRO ) 20 MG tablet     Please advise.

## 2024-12-12 MED ORDER — ESCITALOPRAM OXALATE 20 MG PO TABS: 20.0000 mg | ORAL_TABLET | Freq: Every day | ORAL | 1 refills | Status: AC

## 2024-12-12 MED ORDER — BUSPIRONE HCL 7.5 MG PO TABS: 7.5000 mg | ORAL_TABLET | Freq: Two times a day (BID) | ORAL | 1 refills | Status: AC

## 2024-12-12 MED ORDER — TOPIRAMATE 100 MG PO TABS: 100.0000 mg | ORAL_TABLET | Freq: Every day | ORAL | 1 refills | Status: AC

## 2024-12-29 ENCOUNTER — Ambulatory Visit: Admitting: Family Medicine

## 2024-12-29 ENCOUNTER — Encounter: Payer: Self-pay | Admitting: Family Medicine

## 2024-12-29 VITALS — BP 135/68 | HR 75 | Temp 98.4°F | Ht 61.73 in | Wt 239.9 lb

## 2024-12-29 DIAGNOSIS — R42 Dizziness and giddiness: Secondary | ICD-10-CM

## 2024-12-29 DIAGNOSIS — G43809 Other migraine, not intractable, without status migrainosus: Secondary | ICD-10-CM

## 2024-12-29 DIAGNOSIS — F39 Unspecified mood [affective] disorder: Secondary | ICD-10-CM | POA: Diagnosis not present

## 2024-12-29 DIAGNOSIS — R202 Paresthesia of skin: Secondary | ICD-10-CM | POA: Diagnosis not present

## 2024-12-29 DIAGNOSIS — Z9884 Bariatric surgery status: Secondary | ICD-10-CM

## 2024-12-29 DIAGNOSIS — Z713 Dietary counseling and surveillance: Secondary | ICD-10-CM | POA: Diagnosis not present

## 2024-12-29 DIAGNOSIS — Z6841 Body Mass Index (BMI) 40.0 and over, adult: Secondary | ICD-10-CM | POA: Diagnosis not present

## 2024-12-29 DIAGNOSIS — K219 Gastro-esophageal reflux disease without esophagitis: Secondary | ICD-10-CM | POA: Diagnosis not present

## 2024-12-29 DIAGNOSIS — R7309 Other abnormal glucose: Secondary | ICD-10-CM

## 2024-12-29 DIAGNOSIS — D7589 Other specified diseases of blood and blood-forming organs: Secondary | ICD-10-CM | POA: Diagnosis not present

## 2024-12-29 DIAGNOSIS — E871 Hypo-osmolality and hyponatremia: Secondary | ICD-10-CM

## 2024-12-29 DIAGNOSIS — Z1159 Encounter for screening for other viral diseases: Secondary | ICD-10-CM

## 2024-12-29 DIAGNOSIS — Z23 Encounter for immunization: Secondary | ICD-10-CM

## 2024-12-29 MED ORDER — PANTOPRAZOLE SODIUM 20 MG PO TBEC: 20.0000 mg | DELAYED_RELEASE_TABLET | Freq: Every day | ORAL | 1 refills | Status: AC

## 2024-12-29 MED ORDER — WEGOVY 1.5 MG PO TABS: 1.5000 mg | ORAL_TABLET | Freq: Every day | ORAL | 0 refills | Status: AC

## 2024-12-29 MED ORDER — SUMATRIPTAN SUCCINATE 100 MG PO TABS: ORAL_TABLET | ORAL | 5 refills | Status: AC

## 2024-12-29 NOTE — Progress Notes (Signed)
 "     Established patient visit   Patient: Misty Little   DOB: 1975-12-09   49 y.o. Female  MRN: [id number] Visit Date: 12/29/2024  Today's healthcare provider: LAURAINE LOISE BUOY, DO   Chief Complaint  Patient presents with   Transitions Of Care    Patient is here today to transfer care from [facility].  Also has been having a lot of heartburn was wondering if she can get back on Pantoprazole .  Having numbness in her feet, states previous pcp said it wasn't diabetes.  Still concerned not really her feet it is more so on her toes and does it when she exercises, when she is laying down, and sitting certain ways.  Wanted to discuss being prescribed Rybelsus.   Pneumococcal Vaccine- husbands work Colonoscopy reports her ins you have to be 50   Subjective    HPI Misty Little is a 49 year old female who presents with heartburn and numbness in the toes.  She experiences significant heartburn, which occurs sporadically and can be triggered by activities such as drinking water. She has not taken pantoprazole  for six years since her gastric bypass surgery. She does not regularly use antacids like Tums.  She experiences numbness and tingling primarily in her toes, often in the morning or when lying down. These symptoms also occur during exercise or running. Her feet are frequently cold, even in warm environments. She has not undergone nerve studies for these symptoms. No pain in calves during long walks and no issues with mobility.  She has a history of high vitamin B12 levels and has not been informed of any low vitamin levels since her bypass surgery. She recalls being turned away from blood donation due to low iron levels.  She experiences migraines, particularly during her menstrual period, and uses sumatriptan  as needed, having taken it a couple of times last week. She also uses topiramate  for migraine management.  Her current medications include escitalopram , which she takes regularly. She  also takes Buspar  as part of her medication regimen.       Medications: Show/hide medication list[1]       Objective    BP 135/68 (BP Location: Left Arm, Patient Position: Sitting, Cuff Size: Large)   Pulse 75   Temp 98.4 F (36.9 C) (Oral)   Ht 5' 1.73 (1.568 m)   Wt 239 lb 14.4 oz (108.8 kg)   SpO2 98%   BMI 44.26 kg/m     Physical Exam Constitutional:      Appearance: Normal appearance.  HENT:     Head: Normocephalic and atraumatic.  Eyes:     General: No scleral icterus.    Extraocular Movements: Extraocular movements intact.     Conjunctiva/sclera: Conjunctivae normal.  Cardiovascular:     Rate and Rhythm: Normal rate and regular rhythm.     Pulses: Normal pulses.     Heart sounds: Normal heart sounds.  Pulmonary:     Effort: Pulmonary effort is normal. No respiratory distress.     Breath sounds: Normal breath sounds.  Abdominal:     General: Bowel sounds are normal. There is no distension.     Palpations: Abdomen is soft. There is no mass.     Tenderness: There is no abdominal tenderness. There is no guarding.  Musculoskeletal:     Right lower leg: No edema.     Left lower leg: No edema.  Skin:    General: Skin is warm and dry.  Neurological:  Mental Status: She is alert and oriented to person, place, and time. Mental status is at baseline.  Psychiatric:        Mood and Affect: Mood normal.        Behavior: Behavior normal.      No results found for any visits on 12/29/24.  Assessment & Plan    Mood disorder  Gastroesophageal reflux disease, unspecified whether esophagitis present -     Pantoprazole  Sodium; Take 1 tablet (20 mg total) by mouth daily.  Dispense: 90 tablet; Refill: 1 -     Wegovy ; Take 1 tablet (1.5 mg total) by mouth daily. Daily in AM on an empty stomach with 4 oz of water. Do not eat or drink for 30 minutes after dose.  Dispense: 30 tablet; Refill: 0  Other migraine without status migrainosus, not intractable -      SUMAtriptan  Succinate; May repeat in 2 hours if headache persists or recurs.  Dispense: 10 tablet; Refill: 5  Morbid obesity with BMI of 40.0-44.9, adult (HCC) -     Wegovy ; Take 1 tablet (1.5 mg total) by mouth daily. Daily in AM on an empty stomach with 4 oz of water. Do not eat or drink for 30 minutes after dose.  Dispense: 30 tablet; Refill: 0  Weight loss counseling, encounter for -     Wegovy ; Take 1 tablet (1.5 mg total) by mouth daily. Daily in AM on an empty stomach with 4 oz of water. Do not eat or drink for 30 minutes after dose.  Dispense: 30 tablet; Refill: 0  Paresthesia of both feet -     VAS US  ABI WITH/WO TBI; Future -     Iron, TIBC and Ferritin Panel -     Zinc -     Copper, serum -     CBC -     Vitamin E -     Folate  S/P gastric bypass -     Iron, TIBC and Ferritin Panel -     Zinc -     Copper, serum -     Vitamin E  Macrocytosis -     Folate  Elevated glucose -     Comprehensive metabolic panel with GFR     Mood disorder Chronic, well-controlled on escitalopram  20 mg daily and buspirone  7.5 mg twice daily.  PHQ-9 score 2 and GAD-7 score 0.  No acute concerns.  Continue to monitor.  No changes today.  Gastroesophageal reflux disease, unspecified whether gastritis present Intermittent heartburn, possibly exacerbated by weight. Discussed pantoprazole  risks: reduced B12 absorption, association with increased pneumonia risk. - Restart pantoprazole  for heartburn management.  Other migraine without status migrainosus, not intractable Migraines, especially during menstruation. Managed with sumatriptan  and topiramate .  No changes today. - Continue sumatriptan  as prescribed for acute migraine attacks. - Continue topiramate  100 mg daily for migraine prevention.  Morbid obesity with BMI 40.0-44.9, adult; weight loss counseling, encounter for Chronic, BMI 44.26 kg/m.  Likely contributor to gastroesophageal reflux disease symptoms.  Discussed lifestyle  modifications as noted below and GLP-1's per patient preference.  Patient denies personal history of pancreatitis as well as personal/family history of of medullary thyroid  cancer and multiple endocrine neoplasia type 2 (MEN2).  Will prescribe oral Wegovy  due to patient's aversion to injections. - Caloric deficit 1700-1900/day, with goal weight 170 lbs in the next 12-18 months. - Aim 150 minutes moderate-intensity physical activity over the course of each week.  Paresthesia of both feet Numbness and tingling in toes,  primarily in the morning and during exercise. Differential includes peripheral arterial disease, nutritional/metabolic deficiencies and anemia. - Ordered ultrasound to check for peripheral arterial disease. - Ordered blood work to assess for metabolic sources and anemia.  Status post gastric bypass Patient is status post Roux-en-Y gastric bypass.  Given paresthesias, will evaluate for possible deficiencies as noted.  General Health Maintenance Pneumonia vaccine received. Hepatitis B vaccination series completed. Declined COVID booster. Colonoscopy pending insurance coverage at age 29. - Verify hepatitis B vaccination records; patient has requested from pharmacy. - Consider colonoscopy when insurance coverage is available next year.    Return in about 6 months (around 06/28/2025) for TOC/CPE w/next provider.      I discussed the assessment and treatment plan with the patient  The patient was provided an opportunity to ask questions and all were answered. The patient agreed with the plan and demonstrated an understanding of the instructions.   The patient was advised to call back or seek an in-person evaluation if the symptoms worsen or if the condition fails to improve as anticipated.    LAURAINE LOISE BUOY, DO  [facility] [phone] (phone) [phone] (fax)  [facility]     [1]  Outpatient Medications Prior to Visit   Medication Sig   albuterol  (VENTOLIN  HFA) 108 (90 Base) MCG/ACT inhaler Inhale 2 puffs into the lungs every 6 (six) hours as needed for wheezing or shortness of breath.   busPIRone  (BUSPAR ) 7.5 MG tablet Take 1 tablet (7.5 mg total) by mouth 2 (two) times daily.   cyclobenzaprine  (FLEXERIL ) 5 MG tablet Take 1 tablet (5 mg total) by mouth 3 (three) times daily as needed (muscle pain).   escitalopram  (LEXAPRO ) 20 MG tablet Take 1 tablet (20 mg total) by mouth daily.   fluticasone  (FLONASE ) 50 MCG/ACT nasal spray Place 2 sprays into both nostrils daily.   loratadine (CLARITIN) 10 MG tablet Take by mouth.   norethindrone  (MICRONOR ) 0.35 MG tablet Take 1 tablet (0.35 mg total) by mouth daily.   topiramate  (TOPAMAX ) 100 MG tablet Take 1 tablet (100 mg total) by mouth daily.   [DISCONTINUED] SUMAtriptan  (IMITREX ) 100 MG tablet May repeat in 2 hours if headache persists or recurs.   No facility-administered medications prior to visit.   "

## 2024-12-29 NOTE — Patient Instructions (Addendum)
 Increase your intake of fresh/frozen fruits and vegetables.  The Mediterranean diet is a great reference for meal ideas.  I strongly encourage you to incorporate exercise into your daily routine (at least 30 minutes most days of the week) and monitor your dietary intake. - aim for 1700-1900 calories per day and at least 150 minutes of moderate-intensity physical activity over the course of each week. - I encourage you to measure/weigh your foods/beverages for a few days to get a more accurate idea of what different amounts of things look like on your plate or in your glass.  - Using smaller plates/glasses also helps in that it tricks our minds into thinking we've eaten more than we have. Studies have shown that we eat more when presented with larger plates, even with the same amount of food on them.   - Plan to eat until you are no longer hungry, rather than until you're full.  If you don't normally exercise, start with something simple or more enjoyable. You can plan to walk for your half hour or do something like dancing if you enjoy it.  Build up your activity over time; this will make it more enjoyable and reduce your risk of injury.  Here are some videos which offer a variety of different workout classes with different levels: https://[email or web address].com/session/203  It is completely free. If a full video is too much for you to do, you can do them in bite-sized chunks (just pausing when needed and resuming later in the day).   Even if these actions don't ultimately lead to weight loss, increasing your activity will still help to reduce your insulin resistance and will improve your cardiovascular health (making heart attack/stroke less likely as you age).

## 2025-01-02 ENCOUNTER — Encounter: Payer: Self-pay | Admitting: Family Medicine

## 2025-01-04 ENCOUNTER — Telehealth: Payer: Self-pay

## 2025-01-04 NOTE — Telephone Encounter (Signed)
 Copied from CRM [id number]. Topic: Clinical - Medication Question >> Jan 04, 2025  8:58 AM Montie POUR wrote: Reason for CRM:  I let her know the prior authorization has been sent in on 01/02/25 or 01/03/25 per notes in chart for semaglutide -weight management (WEGOVY ) 1.5 MG tablet. Please send a message through MyChart when prior authorization is completed.

## 2025-01-05 LAB — CBC
Hematocrit: 43.4 % (ref 34.0–46.6)
Hemoglobin: 14.4 g/dL (ref 11.1–15.9)
MCH: 32.9 pg (ref 26.6–33.0)
MCHC: 33.2 g/dL (ref 31.5–35.7)
MCV: 99 fL — ABNORMAL HIGH (ref 79–97)
Platelets: 208 10*3/uL (ref 150–450)
RBC: 4.38 x10E6/uL (ref 3.77–5.28)
RDW: 12.8 % (ref 11.7–15.4)
WBC: 5.4 10*3/uL (ref 3.4–10.8)

## 2025-01-05 LAB — COMPREHENSIVE METABOLIC PANEL WITH GFR
ALT: 10 [IU]/L (ref 0–32)
AST: 21 [IU]/L (ref 0–40)
Albumin: 4.1 g/dL (ref 3.9–4.9)
Alkaline Phosphatase: 61 [IU]/L (ref 41–116)
BUN/Creatinine Ratio: 16 (ref 9–23)
BUN: 13 mg/dL (ref 6–24)
Bilirubin Total: 0.3 mg/dL (ref 0.0–1.2)
CO2: 20 mmol/L (ref 20–29)
Calcium: 8.8 mg/dL (ref 8.7–10.2)
Chloride: 107 mmol/L — ABNORMAL HIGH (ref 96–106)
Creatinine, Ser: 0.81 mg/dL (ref 0.57–1.00)
Globulin, Total: 2.3 g/dL (ref 1.5–4.5)
Glucose: 92 mg/dL (ref 70–99)
Potassium: 4.2 mmol/L (ref 3.5–5.2)
Sodium: 139 mmol/L (ref 134–144)
Total Protein: 6.4 g/dL (ref 6.0–8.5)
eGFR: 89 mL/min/{1.73_m2}

## 2025-01-05 LAB — IRON,TIBC AND FERRITIN PANEL
Ferritin: 134 ng/mL (ref 15–150)
Iron Saturation: 46 % (ref 15–55)
Iron: 127 ug/dL (ref 27–159)
Total Iron Binding Capacity: 278 ug/dL (ref 250–450)
UIBC: 151 ug/dL (ref 131–425)

## 2025-01-05 LAB — VITAMIN E
Vitamin E (Alpha Tocopherol): 12.4 mg/L (ref 7.0–25.1)
Vitamin E(Gamma Tocopherol): 0.7 mg/L (ref 0.5–5.5)

## 2025-01-05 LAB — ZINC: Zinc: 84 ug/dL (ref 44–115)

## 2025-01-05 LAB — COPPER, SERUM: Copper: 119 ug/dL (ref 76–142)
